# Patient Record
Sex: Female | Born: 1935 | Race: Black or African American | Hispanic: No | Smoking: Former smoker
Health system: Southern US, Community
[De-identification: ages and names within clinical notes are randomized; demographics above are authoritative.]

## PROBLEM LIST (undated history)

## (undated) DIAGNOSIS — Z5189 Encounter for other specified aftercare: Secondary | ICD-10-CM

## (undated) DIAGNOSIS — E785 Hyperlipidemia, unspecified: Secondary | ICD-10-CM

## (undated) DIAGNOSIS — I2510 Atherosclerotic heart disease of native coronary artery without angina pectoris: Secondary | ICD-10-CM

## (undated) DIAGNOSIS — R351 Nocturia: Secondary | ICD-10-CM

## (undated) DIAGNOSIS — K219 Gastro-esophageal reflux disease without esophagitis: Secondary | ICD-10-CM

## (undated) DIAGNOSIS — E119 Type 2 diabetes mellitus without complications: Secondary | ICD-10-CM

## (undated) DIAGNOSIS — M545 Low back pain: Secondary | ICD-10-CM

## (undated) DIAGNOSIS — Z8619 Personal history of other infectious and parasitic diseases: Secondary | ICD-10-CM

## (undated) DIAGNOSIS — Z8489 Family history of other specified conditions: Secondary | ICD-10-CM

## (undated) DIAGNOSIS — R350 Frequency of micturition: Secondary | ICD-10-CM

## (undated) DIAGNOSIS — I10 Essential (primary) hypertension: Secondary | ICD-10-CM

## (undated) DIAGNOSIS — R011 Cardiac murmur, unspecified: Secondary | ICD-10-CM

## (undated) DIAGNOSIS — IMO0001 Reserved for inherently not codable concepts without codable children: Secondary | ICD-10-CM

## (undated) DIAGNOSIS — M1990 Unspecified osteoarthritis, unspecified site: Secondary | ICD-10-CM

## (undated) DIAGNOSIS — F419 Anxiety disorder, unspecified: Secondary | ICD-10-CM

## (undated) DIAGNOSIS — R0602 Shortness of breath: Secondary | ICD-10-CM

## (undated) DIAGNOSIS — G4730 Sleep apnea, unspecified: Secondary | ICD-10-CM

## (undated) DIAGNOSIS — M5430 Sciatica, unspecified side: Secondary | ICD-10-CM

## (undated) DIAGNOSIS — F32A Depression, unspecified: Secondary | ICD-10-CM

## (undated) DIAGNOSIS — G629 Polyneuropathy, unspecified: Secondary | ICD-10-CM

## (undated) DIAGNOSIS — G8929 Other chronic pain: Secondary | ICD-10-CM

## (undated) DIAGNOSIS — N184 Chronic kidney disease, stage 4 (severe): Secondary | ICD-10-CM

## (undated) DIAGNOSIS — R600 Localized edema: Secondary | ICD-10-CM

## (undated) DIAGNOSIS — F329 Major depressive disorder, single episode, unspecified: Secondary | ICD-10-CM

## (undated) HISTORY — PX: DILATION AND CURETTAGE OF UTERUS: SHX78

## (undated) HISTORY — PX: LYMPH NODE DISSECTION: SHX5087

## (undated) HISTORY — PX: TUBAL LIGATION: SHX77

## (undated) HISTORY — PX: COLONOSCOPY: SHX174

## (undated) HISTORY — PX: REFRACTIVE SURGERY: SHX103

## (undated) HISTORY — PX: ESOPHAGOGASTRODUODENOSCOPY: SHX1529

## (undated) HISTORY — PX: EYE SURGERY: SHX253

---

## 1998-09-03 ENCOUNTER — Other Ambulatory Visit: Admission: RE | Admit: 1998-09-03 | Discharge: 1998-09-03 | Payer: Self-pay | Admitting: Obstetrics & Gynecology

## 1999-09-19 ENCOUNTER — Other Ambulatory Visit: Admission: RE | Admit: 1999-09-19 | Discharge: 1999-09-19 | Payer: Self-pay | Admitting: Obstetrics & Gynecology

## 2001-01-31 ENCOUNTER — Other Ambulatory Visit: Admission: RE | Admit: 2001-01-31 | Discharge: 2001-01-31 | Payer: Self-pay | Admitting: Endocrinology

## 2003-08-12 ENCOUNTER — Emergency Department (HOSPITAL_COMMUNITY): Admission: EM | Admit: 2003-08-12 | Discharge: 2003-08-12 | Payer: Self-pay | Admitting: Emergency Medicine

## 2003-08-27 ENCOUNTER — Other Ambulatory Visit: Admission: RE | Admit: 2003-08-27 | Discharge: 2003-08-27 | Payer: Self-pay | Admitting: Endocrinology

## 2004-10-27 ENCOUNTER — Encounter (INDEPENDENT_AMBULATORY_CARE_PROVIDER_SITE_OTHER): Payer: Self-pay | Admitting: *Deleted

## 2004-10-27 ENCOUNTER — Ambulatory Visit (HOSPITAL_COMMUNITY): Admission: RE | Admit: 2004-10-27 | Discharge: 2004-10-27 | Payer: Self-pay | Admitting: *Deleted

## 2004-12-11 ENCOUNTER — Emergency Department (HOSPITAL_COMMUNITY): Admission: EM | Admit: 2004-12-11 | Discharge: 2004-12-11 | Payer: Self-pay | Admitting: *Deleted

## 2006-01-18 ENCOUNTER — Encounter (INDEPENDENT_AMBULATORY_CARE_PROVIDER_SITE_OTHER): Payer: Self-pay | Admitting: *Deleted

## 2006-01-18 ENCOUNTER — Ambulatory Visit (HOSPITAL_COMMUNITY): Admission: RE | Admit: 2006-01-18 | Discharge: 2006-01-18 | Payer: Self-pay | Admitting: *Deleted

## 2006-08-09 ENCOUNTER — Encounter: Admission: RE | Admit: 2006-08-09 | Discharge: 2006-08-09 | Payer: Self-pay | Admitting: Endocrinology

## 2008-04-24 ENCOUNTER — Encounter: Admission: RE | Admit: 2008-04-24 | Discharge: 2008-07-23 | Payer: Self-pay | Admitting: Endocrinology

## 2008-10-16 ENCOUNTER — Other Ambulatory Visit: Admission: RE | Admit: 2008-10-16 | Discharge: 2008-10-16 | Payer: Self-pay | Admitting: Endocrinology

## 2010-04-07 ENCOUNTER — Emergency Department (HOSPITAL_COMMUNITY): Admission: EM | Admit: 2010-04-07 | Discharge: 2010-04-07 | Payer: Self-pay | Admitting: Emergency Medicine

## 2011-05-05 NOTE — Op Note (Signed)
NAMEJAQUISHA, ACEVES                ACCOUNT NO.:  [id number]   MEDICAL RECORD NO.:  [id number]          PATIENT TYPE:  AMB   LOCATION:  ENDO                         FACILITY:  [facility]   PHYSICIAN:  Waverly Ferrari, M.D.    DATE OF BIRTH:  1934-08-22   DATE OF PROCEDURE:  10/27/2004  DATE OF DISCHARGE:                                 OPERATIVE REPORT   PROCEDURE:  Colonoscopy.   INDICATIONS:  Diarrhea and colon cancer screening.   ANESTHESIA:  Demerol 25 mg and Versed 2.5 mg.   DESCRIPTION OF PROCEDURE:  The patient was mildly sedated in the left  lateral decubitus position.  Olympus video coloscope was inserted in the  rectum and passed under direct vision with the cecum identified by the  ileocecal valve and appendiceal orifices, both of which were photographed.  From this point, the colonoscope was slowly withdrawn, taking  circumferential views of the colonic mucosa, stopping to photograph  diverticula along the way and stopping to take random biopsies from normal-  appearing mucosa until we reached the rectum which appeared normal on direct  and retroflexed view.  The endoscope was straightened and withdrawn.  The  patient's vital signs and pulse oximetry remained stable.  The patient  tolerated the procedure well with no apparent complications.   FINDINGS:  Diverticulosis scattered throughout the colon, more in the  sigmoid colon than elsewhere.  Otherwise an unremarkable colonoscopic  examination.  Of note, photographs were miscolored due to the processing,  but otherwise this was an unremarkable examination.   PLAN:  Await biopsy report.  The patient will call me for results and follow  up with me as an outpatient.       GMO/MEDQ  D:  10/27/2004  T:  10/27/2004  Job:  [id number]

## 2011-05-05 NOTE — Op Note (Signed)
NAMECHELCEE, GULBRANDSON                ACCOUNT NO.:  [id number]   MEDICAL RECORD NO.:  [id number]          PATIENT TYPE:  AMB   LOCATION:  ENDO                         FACILITY:  [facility]   PHYSICIAN:  Waverly Ferrari, M.D.    DATE OF BIRTH:  1934/06/05   DATE OF PROCEDURE:  01/18/2006  DATE OF DISCHARGE:                                 OPERATIVE REPORT   PROCEDURE:  Upper endoscopy with biopsy.   INDICATIONS FOR PROCEDURE:  GERD with Barrett's esophagus.   ANESTHESIA:  Demerol 40, Versed 6 mg.   PROCEDURE:  With patient mildly sedated in the left lateral decubitus  position, the Olympus videoscopic endoscope was inserted in the mouth,  passed under direct vision through the esophagus which appeared normal until  we reached the distal esophagus and there appeared to be an area of  Barrett's that had been seen previously.  This was photographed and  biopsied.  We entered the stomach.  Fundus, body, antrum, duodenal bulb and  second portion duodenum all appeared normal.  From this point, the endoscope  was slowly withdrawn taking circumferential views of the duodenal mucosa  until the endoscope had been pulled back into the stomach, placed in  retroflexion to view the stomach from below.  The endoscope was straightened  and withdrawn, taking circumferential views of the remaining gastric and  esophageal mucosa.  Patient's vital signs and pulse oximeter remained  stable.  Patient tolerated the procedure well without apparent  complications.   FINDINGS:  Barrett's esophagus above hiatal hernia.  Await biopsy report.  Patient will call me for results and follow up with me as an outpatient.           ______________________________  Waverly Ferrari, M.D.     GMO/MEDQ  D:  01/18/2006  T:  01/18/2006  Job:  [id number]

## 2011-05-05 NOTE — Op Note (Signed)
NAMEMARIELI, Mary Harding                ACCOUNT NO.:  [id number]   MEDICAL RECORD NO.:  [id number]          PATIENT TYPE:  AMB   LOCATION:  ENDO                         FACILITY:  [facility]   PHYSICIAN:  Waverly Ferrari, M.D.    DATE OF BIRTH:  12/31/1933   DATE OF PROCEDURE:  DATE OF DISCHARGE:                                 OPERATIVE REPORT   PROCEDURE:  Upper endoscopy with biopsy.   SURGEON:   INDICATIONS FOR PROCEDURE:  GERD.   ANESTHESIA:  Demerol 50 and Versed 5 mg.   DESCRIPTION OF PROCEDURE:  With the patient mildly sedated in the left  lateral decubitus position, the Olympus video scope endoscope inserted in  the mouth and passed under direct vision through the esophagus which  appeared normal except there were changes of possible Barrett's photographed  and biopsied.  We entered in the stomach.  Fundus, body, antrum, duodenal  bulb and second portion of the duodenum all appeared normal.  From this  point, the endoscope was slowly withdrawn, taking circumferential views of  the duodenal mucosa until the endoscope was pulled back into the stomach,  placed in retroflexion to view the stomach from below.  The endoscope was  straightened and withdrawn, taking circumferential views of the gastric and  esophageal mucosa.  The patient's vital signs and pulse oximeter remained  stable.  The patient tolerated the procedure well without apparent  complications.   FINDINGS:  Question of Barrett's esophagus.  Biopsies taken.  Unfortunately  the photographs were discolored due to process problem.  Await biopsy  report.  The patient will call for results and follow-up __________.       Belva Crome  D:  10/27/2004  T:  10/27/2004  Job:  [id number]

## 2011-12-22 DIAGNOSIS — E119 Type 2 diabetes mellitus without complications: Secondary | ICD-10-CM | POA: Diagnosis not present

## 2011-12-29 DIAGNOSIS — E119 Type 2 diabetes mellitus without complications: Secondary | ICD-10-CM | POA: Diagnosis not present

## 2011-12-29 DIAGNOSIS — M79609 Pain in unspecified limb: Secondary | ICD-10-CM | POA: Diagnosis not present

## 2011-12-29 DIAGNOSIS — I10 Essential (primary) hypertension: Secondary | ICD-10-CM | POA: Diagnosis not present

## 2011-12-29 DIAGNOSIS — E789 Disorder of lipoprotein metabolism, unspecified: Secondary | ICD-10-CM | POA: Diagnosis not present

## 2011-12-29 DIAGNOSIS — Z23 Encounter for immunization: Secondary | ICD-10-CM | POA: Diagnosis not present

## 2011-12-29 DIAGNOSIS — M25569 Pain in unspecified knee: Secondary | ICD-10-CM | POA: Diagnosis not present

## 2011-12-29 DIAGNOSIS — M25559 Pain in unspecified hip: Secondary | ICD-10-CM | POA: Diagnosis not present

## 2012-01-02 DIAGNOSIS — H6500 Acute serous otitis media, unspecified ear: Secondary | ICD-10-CM | POA: Diagnosis not present

## 2012-01-12 DIAGNOSIS — M1990 Unspecified osteoarthritis, unspecified site: Secondary | ICD-10-CM | POA: Diagnosis not present

## 2012-01-15 ENCOUNTER — Other Ambulatory Visit: Payer: Self-pay | Admitting: Ophthalmology

## 2012-01-15 DIAGNOSIS — H25049 Posterior subcapsular polar age-related cataract, unspecified eye: Secondary | ICD-10-CM | POA: Diagnosis not present

## 2012-01-15 NOTE — H&P (Signed)
  Pre-operative History and Physical for Ophthalmic Surgery  Mary Harding 01/15/2012                  Chief Complaint: Decreased vision OS  Diagnosis: Nuclear Sclerotic Cataract  Allergies not on file No allergies to medication  Prior to Admission medications   Not on File    Planned Procedure:  Phacoemulsification, Posterior Chamber Intra-ocular Lens OS There were no vitals filed for this visit.  Pulse: 80         Temp: NE        Resp: 18       ROS:  non-contributory   No past medical history on file.  No past surgical history on file.   History   Social History  . Marital Status: Widowed    Spouse Name: N/A    Number of Children: N/A  . Years of Education: N/A   Occupational History  . Not on file.   Social History Main Topics  . Smoking status: Not on file  . Smokeless tobacco: Not on file  . Alcohol Use: Not on file  . Drug Use: Not on file  . Sexually Active: Not on file   Other Topics Concern  . Not on file   Social History Narrative  . No narrative on file     The following examination is for anesthesia clearance for minimally invasive Ophthalmic surgery. It is primarily to document heart and lung findings and is not intended to elucidate unknown general medical conditions inclusive of abdominal masses, lung lesions, etc.   General Constitution:  within normal limits    Alertness/Orientation:  Person, time place     yes   HEENT:  Eye Findings: Cataract Left Eye                   left eye  Neck: supple without masses  Chest/Lungs: clear to auscultation  Cardiac: Normal S1 and S2 without Murmur, S3 or S4  Neuro: non fasting  Impression:  Visually significant Cataract OS  Planned Procedure: Phacoemulsification, Posterior Chamber Intraocular Lens OS    Adonis Brook, MD

## 2012-01-16 DIAGNOSIS — E789 Disorder of lipoprotein metabolism, unspecified: Secondary | ICD-10-CM | POA: Diagnosis not present

## 2012-01-16 DIAGNOSIS — E119 Type 2 diabetes mellitus without complications: Secondary | ICD-10-CM | POA: Diagnosis not present

## 2012-01-16 DIAGNOSIS — I10 Essential (primary) hypertension: Secondary | ICD-10-CM | POA: Diagnosis not present

## 2012-01-23 DIAGNOSIS — M79609 Pain in unspecified limb: Secondary | ICD-10-CM | POA: Diagnosis not present

## 2012-01-23 DIAGNOSIS — E119 Type 2 diabetes mellitus without complications: Secondary | ICD-10-CM | POA: Diagnosis not present

## 2012-01-23 DIAGNOSIS — I10 Essential (primary) hypertension: Secondary | ICD-10-CM | POA: Diagnosis not present

## 2012-01-23 DIAGNOSIS — N390 Urinary tract infection, site not specified: Secondary | ICD-10-CM | POA: Diagnosis not present

## 2012-01-30 ENCOUNTER — Encounter (HOSPITAL_COMMUNITY): Payer: Self-pay | Admitting: Pharmacy Technician

## 2012-02-06 ENCOUNTER — Ambulatory Visit (HOSPITAL_COMMUNITY)
Admission: RE | Admit: 2012-02-06 | Discharge: 2012-02-06 | Disposition: A | Discharge status: Home / Self Care | Payer: Medicare Other | Source: Ambulatory Visit | Attending: Anesthesiology | Admitting: Anesthesiology

## 2012-02-06 ENCOUNTER — Other Ambulatory Visit: Payer: Self-pay

## 2012-02-06 ENCOUNTER — Encounter (HOSPITAL_COMMUNITY)
Admission: RE | Admit: 2012-02-06 | Discharge: 2012-02-06 | Disposition: A | Discharge status: Home / Self Care | Payer: Medicare Other | Source: Ambulatory Visit | Attending: Ophthalmology | Admitting: Ophthalmology

## 2012-02-06 ENCOUNTER — Encounter (HOSPITAL_COMMUNITY): Payer: Self-pay

## 2012-02-06 DIAGNOSIS — H269 Unspecified cataract: Secondary | ICD-10-CM | POA: Diagnosis not present

## 2012-02-06 DIAGNOSIS — Z01812 Encounter for preprocedural laboratory examination: Secondary | ICD-10-CM | POA: Insufficient documentation

## 2012-02-06 DIAGNOSIS — Z01818 Encounter for other preprocedural examination: Secondary | ICD-10-CM | POA: Diagnosis not present

## 2012-02-06 DIAGNOSIS — Z01811 Encounter for preprocedural respiratory examination: Secondary | ICD-10-CM | POA: Diagnosis not present

## 2012-02-06 HISTORY — DX: Sciatica, unspecified side: M54.30

## 2012-02-06 HISTORY — DX: Hyperlipidemia, unspecified: E78.5

## 2012-02-06 HISTORY — DX: Unspecified osteoarthritis, unspecified site: M19.90

## 2012-02-06 HISTORY — DX: Sleep apnea, unspecified: G47.30

## 2012-02-06 HISTORY — DX: Essential (primary) hypertension: I10

## 2012-02-06 HISTORY — DX: Reserved for inherently not codable concepts without codable children: IMO0001

## 2012-02-06 HISTORY — DX: Nocturia: R35.1

## 2012-02-06 HISTORY — DX: Polyneuropathy, unspecified: G62.9

## 2012-02-06 HISTORY — DX: Frequency of micturition: R35.0

## 2012-02-06 HISTORY — DX: Localized edema: R60.0

## 2012-02-06 HISTORY — DX: Gastro-esophageal reflux disease without esophagitis: K21.9

## 2012-02-06 HISTORY — DX: Shortness of breath: R06.02

## 2012-02-06 HISTORY — DX: Encounter for other specified aftercare: Z51.89

## 2012-02-06 HISTORY — DX: Personal history of other infectious and parasitic diseases: Z86.19

## 2012-02-06 LAB — CBC
Hemoglobin: 12.8 g/dL (ref 12.0–15.0)
MCH: 29.8 pg (ref 26.0–34.0)
MCHC: 31.6 g/dL (ref 30.0–36.0)
Platelets: 282 10*3/uL (ref 150–400)

## 2012-02-06 LAB — BASIC METABOLIC PANEL
BUN: 20 mg/dL (ref 6–23)
Calcium: 10.8 mg/dL — ABNORMAL HIGH (ref 8.4–10.5)
GFR calc non Af Amer: 54 mL/min — ABNORMAL LOW (ref >90)
Glucose, Bld: 268 mg/dL — ABNORMAL HIGH (ref 70–99)

## 2012-02-06 NOTE — Progress Notes (Signed)
Confirmed with office that the left cataract will be removed by Dr.Geiger on 02/14/12

## 2012-02-06 NOTE — Progress Notes (Signed)
Sleep study requested from Dr.Farr

## 2012-02-06 NOTE — Progress Notes (Signed)
Pt doesn't have a cardiologist;medical MD Dr.Kohut manages HTN/Hyperlipidemia  Stress test done >51yrs ago  Never had a heart cath  Never had an echo

## 2012-02-06 NOTE — Progress Notes (Deleted)
Left eye

## 2012-02-06 NOTE — Pre-Procedure Instructions (Signed)
Mary Harding  02/06/2012   Your procedure is scheduled on:  Wed, Feb 27 @ 0830  Report to [facility] at [place] [place].  Call this number if you have problems the morning of surgery: [phone]   Remember:   Do not eat food:After Midnight.  May have clear liquids: up to 4 Hours before arrival.(until 2:30 am)  Clear liquids include soda, tea, black coffee, apple or grape juice, broth.  Take these medicines the morning of surgery with A SIP OF WATER: Amlodipine,Celebrex,Pain Pill(if needed),and Protonix   Do not wear jewelry, make-up or nail polish.  Do not wear lotions, powders, or perfumes. You may wear deodorant.  Do not shave 48 hours prior to surgery.  Do not bring valuables to the hospital.  Contacts, dentures or bridgework may not be worn into surgery.  Leave suitcase in the car. After surgery it may be brought to your room.  For patients admitted to the hospital, checkout time is 11:00 AM the day of discharge.   Patients discharged the day of surgery will not be allowed to drive home.  Name and phone number of your driver:   Special Instructions: CHG Shower Use Special Wash: 1/2 bottle night before surgery and 1/2 bottle morning of surgery.   Please read over the following fact sheets that you were given: Pain Booklet, Coughing and Deep Breathing and Surgical Site Infection Prevention

## 2012-02-06 NOTE — Progress Notes (Signed)
Average fasting blood sugar 160

## 2012-02-13 MED ORDER — HOMATROPINE HBR 2 % OP SOLN
1.0000 [drp] | OPHTHALMIC | Status: DC | PRN
  Filled 2012-02-13: qty 5

## 2012-02-13 MED ORDER — PHENYLEPHRINE HCL 2.5 % OP SOLN
1.0000 [drp] | OPHTHALMIC | Status: AC | PRN
  Administered 2012-02-14 (×3): 1 [drp] via OPHTHALMIC
  Filled 2012-02-13: qty 3

## 2012-02-13 MED ORDER — GATIFLOXACIN 0.5 % OP SOLN
1.0000 [drp] | OPHTHALMIC | Status: AC | PRN
  Administered 2012-02-14 (×3): 1 [drp] via OPHTHALMIC
  Filled 2012-02-13: qty 2.5

## 2012-02-13 MED ORDER — TETRACAINE HCL 0.5 % OP SOLN
2.0000 [drp] | OPHTHALMIC | Status: AC
  Administered 2012-02-14: 2 [drp] via OPHTHALMIC
  Filled 2012-02-13: qty 2

## 2012-02-13 MED ORDER — PREDNISOLONE ACETATE 1 % OP SUSP
1.0000 [drp] | OPHTHALMIC | Status: AC
  Administered 2012-02-14: 1 [drp] via OPHTHALMIC
  Filled 2012-02-13: qty 5

## 2012-02-14 ENCOUNTER — Encounter (HOSPITAL_COMMUNITY): Payer: Self-pay | Admitting: *Deleted

## 2012-02-14 ENCOUNTER — Encounter (HOSPITAL_COMMUNITY): Payer: Self-pay | Admitting: Anesthesiology

## 2012-02-14 ENCOUNTER — Ambulatory Visit (HOSPITAL_COMMUNITY): Payer: Medicare Other | Admitting: Anesthesiology

## 2012-02-14 ENCOUNTER — Ambulatory Visit (HOSPITAL_COMMUNITY)
Admission: RE | Admit: 2012-02-14 | Discharge: 2012-02-14 | Disposition: A | Discharge status: Home / Self Care | Payer: Medicare Other | Source: Ambulatory Visit | Attending: Ophthalmology | Admitting: Ophthalmology

## 2012-02-14 ENCOUNTER — Encounter (HOSPITAL_COMMUNITY)
Admission: RE | Disposition: A | Discharge status: Home / Self Care | Payer: Self-pay | Source: Ambulatory Visit | Attending: Ophthalmology

## 2012-02-14 DIAGNOSIS — G4710 Hypersomnia, unspecified: Secondary | ICD-10-CM | POA: Diagnosis not present

## 2012-02-14 DIAGNOSIS — IMO0002 Reserved for concepts with insufficient information to code with codable children: Secondary | ICD-10-CM | POA: Diagnosis not present

## 2012-02-14 DIAGNOSIS — H2510 Age-related nuclear cataract, unspecified eye: Secondary | ICD-10-CM | POA: Insufficient documentation

## 2012-02-14 DIAGNOSIS — I10 Essential (primary) hypertension: Secondary | ICD-10-CM | POA: Diagnosis not present

## 2012-02-14 DIAGNOSIS — G4730 Sleep apnea, unspecified: Secondary | ICD-10-CM | POA: Diagnosis not present

## 2012-02-14 DIAGNOSIS — Y9210 Unspecified residential institution as the place of occurrence of the external cause: Secondary | ICD-10-CM | POA: Insufficient documentation

## 2012-02-14 DIAGNOSIS — H269 Unspecified cataract: Secondary | ICD-10-CM | POA: Diagnosis not present

## 2012-02-14 DIAGNOSIS — R0602 Shortness of breath: Secondary | ICD-10-CM | POA: Diagnosis not present

## 2012-02-14 HISTORY — PX: CATARACT EXTRACTION W/PHACO: SHX586

## 2012-02-14 HISTORY — PX: PARS PLANA VITRECTOMY: SHX2166

## 2012-02-14 LAB — GLUCOSE, CAPILLARY
Glucose-Capillary: 107 mg/dL — ABNORMAL HIGH (ref 70–99)
Glucose-Capillary: 136 mg/dL — ABNORMAL HIGH (ref 70–99)

## 2012-02-14 SURGERY — PHACOEMULSIFICATION, CATARACT, WITH IOL INSERTION
Anesthesia: Monitor Anesthesia Care | Site: Eye | Laterality: Left | Wound class: Clean

## 2012-02-14 MED ORDER — NA CHONDROIT SULF-NA HYALURON 40-30 MG/ML IO SOLN
INTRAOCULAR | Status: DC | PRN
  Administered 2012-02-14: 0.5 mL via INTRAOCULAR

## 2012-02-14 MED ORDER — DEXAMETHASONE SODIUM PHOSPHATE 10 MG/ML IJ SOLN: INTRAMUSCULAR | Status: DC | PRN

## 2012-02-14 MED ORDER — MIDAZOLAM HCL 5 MG/5ML IJ SOLN
INTRAMUSCULAR | Status: DC | PRN
  Administered 2012-02-14: 1 mg via INTRAVENOUS

## 2012-02-14 MED ORDER — CEFAZOLIN SUBCONJUNCTIVAL INJECTION 100 MG/0.5 ML
INJECTION | SUBCONJUNCTIVAL | Status: DC | PRN
  Administered 2012-02-14: 1 mL via SUBCONJUNCTIVAL

## 2012-02-14 MED ORDER — SODIUM CHLORIDE 0.9 % IV SOLN
INTRAVENOUS | Status: DC | PRN
  Administered 2012-02-14: 10:00:00 via INTRAVENOUS

## 2012-02-14 MED ORDER — BACITRACIN-POLYMYXIN B 500-10000 UNIT/GM OP OINT
TOPICAL_OINTMENT | OPHTHALMIC | Status: DC | PRN
  Administered 2012-02-14: 1 via OPHTHALMIC

## 2012-02-14 MED ORDER — PROPOFOL 10 MG/ML IV EMUL
INTRAVENOUS | Status: DC | PRN
  Administered 2012-02-14 (×2): 10 mg via INTRAVENOUS
  Administered 2012-02-14: 30 mg via INTRAVENOUS
  Administered 2012-02-14 (×3): 10 mg via INTRAVENOUS

## 2012-02-14 MED ORDER — HYPROMELLOSE (GONIOSCOPIC) 2.5 % OP SOLN
OPHTHALMIC | Status: DC | PRN
  Administered 2012-02-14: 1 [drp] via OPHTHALMIC

## 2012-02-14 MED ORDER — BSS IO SOLN
INTRAOCULAR | Status: DC | PRN
  Administered 2012-02-14: 15 mL via INTRAOCULAR

## 2012-02-14 MED ORDER — LIDOCAINE HCL (PF) 2 % IJ SOLN
INTRAMUSCULAR | Status: DC | PRN
  Administered 2012-02-14: 2.5 mL

## 2012-02-14 MED ORDER — WATER FOR IRRIGATION, STERILE IR SOLN
Status: DC | PRN
  Administered 2012-02-14: 1000 mL

## 2012-02-14 MED ORDER — PROVISC 10 MG/ML IO SOLN
INTRAOCULAR | Status: DC | PRN
  Administered 2012-02-14: .85 mL via INTRAOCULAR

## 2012-02-14 MED ORDER — TRIAMCINOLONE ACETONIDE INTRAVITREAL INJECTION 4 MG/0.1 ML
INTRAOCULAR | Status: DC | PRN
  Administered 2012-02-14: 40 mg via INTRAOCULAR

## 2012-02-14 MED ORDER — BSS IO SOLN
INTRAOCULAR | Status: DC | PRN
  Administered 2012-02-14: 500 mL via INTRAOCULAR

## 2012-02-14 MED ORDER — TETRACAINE HCL 0.5 % OP SOLN
OPHTHALMIC | Status: DC | PRN
  Administered 2012-02-14: 1 [drp] via OPHTHALMIC

## 2012-02-14 MED ORDER — FENTANYL CITRATE 0.05 MG/ML IJ SOLN
INTRAMUSCULAR | Status: DC | PRN
  Administered 2012-02-14: 50 ug via INTRAVENOUS

## 2012-02-14 MED ORDER — DEXTROSE-NACL 5-0.45 % IV SOLN
INTRAVENOUS | Status: DC | PRN
  Administered 2012-02-14: 09:00:00 via INTRAVENOUS

## 2012-02-14 MED ORDER — 0.9 % SODIUM CHLORIDE (POUR BTL) OPTIME
TOPICAL | Status: DC | PRN
  Administered 2012-02-14: 1000 mL

## 2012-02-14 MED ORDER — CEFAZOLIN SUBCONJUNCTIVAL INJECTION 100 MG/0.5 ML
200.0000 mg | INJECTION | SUBCONJUNCTIVAL | Status: DC
  Filled 2012-02-14: qty 1

## 2012-02-14 MED ORDER — EPINEPHRINE HCL 1 MG/ML IJ SOLN
INTRAMUSCULAR | Status: DC | PRN
  Administered 2012-02-14: .3 mg

## 2012-02-14 MED ORDER — LACTATED RINGERS IV SOLN
INTRAVENOUS | Status: DC | PRN
  Administered 2012-02-14: 09:00:00 via INTRAVENOUS

## 2012-02-14 MED ORDER — BUPIVACAINE HCL 0.75 % IJ SOLN
INTRAMUSCULAR | Status: DC | PRN
  Administered 2012-02-14: 2.5 mL

## 2012-02-14 MED ORDER — BSS PLUS IO SOLN
INTRAOCULAR | Status: DC | PRN
  Administered 2012-02-14: 1 via INTRAOCULAR

## 2012-02-14 SURGICAL SUPPLY — 58 items
APPLICATOR COTTON TIP 6IN STRL (MISCELLANEOUS) ×2 IMPLANT
APPLICATOR DR MATTHEWS STRL (MISCELLANEOUS) ×2 IMPLANT
BLADE EYE MINI 60D BEAVER (BLADE) IMPLANT
BLADE KERATOME 2.75 (BLADE) ×2 IMPLANT
BLADE STAB KNIFE 15DEG (BLADE) IMPLANT
CANNULA ANTERIOR CHAMBER 27GA (MISCELLANEOUS) IMPLANT
CLOTH BEACON ORANGE TIMEOUT ST (SAFETY) ×2 IMPLANT
DRAPE OPHTHALMIC 77X100 STRL (CUSTOM PROCEDURE TRAY) ×2 IMPLANT
DRAPE POUCH INSTRU U-SHP 10X18 (DRAPES) ×2 IMPLANT
DRSG TEGADERM 4X4.75 (GAUZE/BANDAGES/DRESSINGS) IMPLANT
FILTER BLUE MILLIPORE (MISCELLANEOUS) IMPLANT
GLOVE SS BIOGEL STRL SZ 6.5 (GLOVE) ×1 IMPLANT
GLOVE SS N UNI LF 6.5 STRL (GLOVE) ×4 IMPLANT
GLOVE SUPERSENSE BIOGEL SZ 6.5 (GLOVE) ×1
GOWN PREVENTION PLUS XLARGE (GOWN DISPOSABLE) ×2 IMPLANT
GOWN STRL NON-REIN LRG LVL3 (GOWN DISPOSABLE) ×2 IMPLANT
ILLUMINATOR WIDEFIELD DIFF (MISCELLANEOUS) ×2 IMPLANT
KIT ROOM TURNOVER OR (KITS) ×2 IMPLANT
KNIFE GRIESHABER SHARP 2.5MM (MISCELLANEOUS) ×2 IMPLANT
LENS IOL ACRYSOF MP POST 22.0 (Intraocular Lens) ×2 IMPLANT
MARKER SKIN DUAL TIP RULER LAB (MISCELLANEOUS) ×2 IMPLANT
MASK EYE SHIELD (GAUZE/BANDAGES/DRESSINGS) ×2 IMPLANT
NEEDLE 18GX1X1/2 (RX/OR ONLY) (NEEDLE) IMPLANT
NEEDLE 22X1 1/2 (OR ONLY) (NEEDLE) ×2 IMPLANT
NEEDLE 25GX 5/8IN NON SAFETY (NEEDLE) ×2 IMPLANT
NEEDLE FILTER BLUNT 18X 1/2SAF (NEEDLE)
NEEDLE FILTER BLUNT 18X1 1/2 (NEEDLE) IMPLANT
NEEDLE HYPO 30X.5 LL (NEEDLE) ×4 IMPLANT
NS IRRIG 1000ML POUR BTL (IV SOLUTION) ×2 IMPLANT
PACK CATARACT CUSTOM (CUSTOM PROCEDURE TRAY) ×2 IMPLANT
PACK CATARACT MCHSCP (PACKS) ×2 IMPLANT
PACK COMBINED CATERACT/VIT 23G (OPHTHALMIC RELATED) IMPLANT
PAD ARMBOARD 7.5X6 YLW CONV (MISCELLANEOUS) ×4 IMPLANT
PAD EYE OVAL STERILE LF (GAUZE/BANDAGES/DRESSINGS) ×2 IMPLANT
PAK VITRECTOMY PIK  23GA (OPHTHALMIC RELATED) ×2 IMPLANT
PHACO TIP KELMAN 45DEG (TIP) ×2 IMPLANT
PROBE ANTERIOR 20G W/INFUS NDL (MISCELLANEOUS) ×2 IMPLANT
ROLLS DENTAL (MISCELLANEOUS) IMPLANT
SHUTTLE MONARCH TYPE A (NEEDLE) ×2 IMPLANT
SOLUTION ANTI FOG 6CC (MISCELLANEOUS) ×2 IMPLANT
SPEAR EYE SURG WECK-CEL (MISCELLANEOUS) ×6 IMPLANT
SUT ETHILON 10-0 CS-B-6CS-B-6 (SUTURE) ×2
SUT ETHILON 5 0 P 3 18 (SUTURE)
SUT ETHILON 9 0 TG140 8 (SUTURE) IMPLANT
SUT NYLON ETHILON 5-0 P-3 1X18 (SUTURE) IMPLANT
SUT PLAIN 6 0 TG1408 (SUTURE) IMPLANT
SUT POLY NON ABSORB 10-0 8 STR (SUTURE) IMPLANT
SUT VICRYL 6 0 S 29 12 (SUTURE) IMPLANT
SUTURE EHLN 10-0 CS-B-6CS-B-6 (SUTURE) ×1 IMPLANT
SYR 20CC LL (SYRINGE) IMPLANT
SYR 5ML LL (SYRINGE) IMPLANT
SYR TB 1ML LUER SLIP (SYRINGE) ×2 IMPLANT
SYRINGE 10CC LL (SYRINGE) IMPLANT
TAPE SURG TRANSPORE 1 IN (GAUZE/BANDAGES/DRESSINGS) ×1 IMPLANT
TAPE SURGICAL TRANSPORE 1 IN (GAUZE/BANDAGES/DRESSINGS) ×1
TOWEL OR 17X24 6PK STRL BLUE (TOWEL DISPOSABLE) ×4 IMPLANT
WATER STERILE IRR 1000ML POUR (IV SOLUTION) ×2 IMPLANT
WIPE INSTRUMENT VISIWIPE 73X73 (MISCELLANEOUS) ×2 IMPLANT

## 2012-02-14 NOTE — Transfer of Care (Signed)
Immediate Anesthesia Transfer of Care Note  Patient: Mary Harding  Procedure(s) Performed: Procedure(s) (LRB): CATARACT EXTRACTION PHACO AND INTRAOCULAR LENS PLACEMENT ([facility]) (Left) PARS PLANA VITRECTOMY WITH 23 GAUGE (Left)  Patient Location: PACU and Short Stay  Anesthesia Type: MAC  Level of Consciousness: awake  Airway & Oxygen Therapy: Patient Spontanous Breathing  Post-op Assessment: Post -op Vital signs reviewed and stable  Post vital signs: stable  Complications: No apparent anesthesia complications

## 2012-02-14 NOTE — Interval H&P Note (Signed)
History and Physical Interval Note:  02/14/2012 8:28 AM  Mary Harding  has presented today for surgery, with the diagnosis of Nuclear Sclerotic Cataract Left Eye  The various methods of treatment have been discussed with the patient and family. After consideration of risks, benefits and other options for treatment, the patient has consented to  Procedure(s) (LRB): CATARACT EXTRACTION PHACO AND INTRAOCULAR LENS PLACEMENT ([place]) (Left) as a surgical intervention .  The patients' history has been reviewed, patient examined, no change in status, stable for surgery.  I have reviewed the patients' chart and labs.  Questions were answered to the patient's satisfaction.     Charnel Giles,MD

## 2012-02-14 NOTE — Op Note (Signed)
Mary Harding 02/14/2012 Cataract   Procedure: Phacoemulsification, Posterior Chamber Intra-ocular Lens Operative Eye:  left eye  Surgeon: Adonis Brook Estimated Blood Loss: minimal Specimens for Pathology:  None Complications: posterior capsule tear with lens fragments in the vitreous  The patient was prepared and draped in the usual manner for ocular surgery on the left eye. A Cook lid speculum was placed. A peripheral clear corneal incision was made at the surgical limbus centered at the 11:00 meridian. A separate clear corneal stab incision was made with a 15 degree blade at the 2:00 meridian to permit bi-manual technique. Provisc was instilled into the anterior chamber through that incision.  A keratome was used to create a self sealing incision entering the anterior chamber at the 11:00 meridian. A capsulorhexis was performed using a bent 25g needle. The lens was hydrodissected and the nucleus was hydrodilineated using a Nichammin cannula. The Chang chopper was inserted and used to rotate the lens to insure adequate lens mobility. The phacoemulsification handpiece was inserted and a combined phaco-chop technique was employed, fracturing the lens into separate sections with subsequent removal with the phaco handpiece.  The lens was fairly adherent to the posterior capsule. Provisc was used to affect separation of the lens from the capsule which did facilitate removal of additional lens nucleus and cortex. While removing the remaining superior cortex  fragment an interior capsule tear developed and some cortex material fell into the vitreous. I had left a generous anterior capsule support. Provisc was instilled and the PC IOL, +22.00,  was placed into the sulcus in front of the capsule. The I/A piece was used to remove the remaining viscoelastic. A 10-0 nylon suture was placed. A 3 port 23g vitrectomy was performed using an infusion cannula at the 4:30 meridian. The vitreous was removed up to the  vitreous base for 360 degrees. The anterior fragments were removed with wide field viewing and the premacular fragments were removed with vitreous cutter without incident.  The infusion was turned down to 41mmhg and the superior cannulas were removed with concomitant tamponade. The posterior subtenons injection of Kenalog was done with the infusion in place, 40mg . Once completed the cannu;a was removed. Subconjunctival injection of Ancef 100/0.70ml was placed without complication. The lid speculum and drapes were removed and the patient's eye was patched with Polymixin/Bacitracin ophthalmic ointment. An eye shield was placed and the patient was transferred alert and conversant from the operating room to the post-operative recovery area.   Adonis Brook, MD

## 2012-02-14 NOTE — Anesthesia Postprocedure Evaluation (Signed)
  Anesthesia Post-op Note  Patient: Mary Harding  Procedure(s) Performed: Procedure(s) (LRB): CATARACT EXTRACTION PHACO AND INTRAOCULAR LENS PLACEMENT ([facility]) (Left) PARS PLANA VITRECTOMY WITH 23 GAUGE (Left)  Patient Location: PACU and Short Stay  Anesthesia Type: MAC  Level of Consciousness: oriented  Airway and Oxygen Therapy: Patient Spontanous Breathing  Post-op Pain: none  Post-op Assessment: Post-op Vital signs reviewed  Post-op Vital Signs: stable  Complications: No apparent anesthesia complications

## 2012-02-14 NOTE — Anesthesia Preprocedure Evaluation (Addendum)
Anesthesia Evaluation  Patient identified by MRN, date of birth, ID band Patient awake    Reviewed: Allergy & Precautions, H&P , NPO status , Patient's Chart, lab work & pertinent test results  Airway Mallampati: II TM Distance: >3 FB Neck ROM: Full    Dental  (+) Edentulous Upper and Edentulous Lower   Pulmonary shortness of breath and with exertion, sleep apnea and Continuous Positive Airway Pressure Ventilation ,  clear to auscultation  Pulmonary exam normal       Cardiovascular hypertension, Pt. on medications Regular Normal    Neuro/Psych  Neuromuscular disease    GI/Hepatic GERD-  Medicated,  Endo/Other  Diabetes mellitus-, Well Controlled, Type 2, Insulin Dependent  Renal/GU      Musculoskeletal   Abdominal (+)  Abdomen: soft. Bowel sounds: normal.  Peds  Hematology   Anesthesia Other Findings   Reproductive/Obstetrics                         Anesthesia Physical Anesthesia Plan  ASA: III  Anesthesia Plan: MAC   Post-op Pain Management:    Induction: Intravenous  Airway Management Planned: Nasal Cannula  Additional Equipment:   Intra-op Plan:   Post-operative Plan:   Informed Consent: I have reviewed the patients History and Physical, chart, labs and discussed the procedure including the risks, benefits and alternatives for the proposed anesthesia with the patient or authorized representative who has indicated his/her understanding and acceptance.     Plan Discussed with: CRNA and Surgeon  Anesthesia Plan Comments:         Anesthesia Quick Evaluation

## 2012-02-14 NOTE — H&P (View-Only) (Signed)
  Pre-operative History and Physical for Ophthalmic Surgery  Mary Harding 01/15/2012                  Chief Complaint: Decreased vision OS  Diagnosis: Nuclear Sclerotic Cataract  Allergies not on file No allergies to medication  Prior to Admission medications   Not on File    Planned Procedure:  Phacoemulsification, Posterior Chamber Intra-ocular Lens OS There were no vitals filed for this visit.  Pulse: 80         Temp: NE        Resp: 18       ROS:  non-contributory   No past medical history on file.  No past surgical history on file.   History   Social History  . Marital Status: Widowed    Spouse Name: N/A    Number of Children: N/A  . Years of Education: N/A   Occupational History  . Not on file.   Social History Main Topics  . Smoking status: Not on file  . Smokeless tobacco: Not on file  . Alcohol Use: Not on file  . Drug Use: Not on file  . Sexually Active: Not on file   Other Topics Concern  . Not on file   Social History Narrative  . No narrative on file     The following examination is for anesthesia clearance for minimally invasive Ophthalmic surgery. It is primarily to document heart and lung findings and is not intended to elucidate unknown general medical conditions inclusive of abdominal masses, lung lesions, etc.   General Constitution:  within normal limits    Alertness/Orientation:  Person, time place     yes   HEENT:  Eye Findings: Cataract Left Eye                   left eye  Neck: supple without masses  Chest/Lungs: clear to auscultation  Cardiac: Normal S1 and S2 without Murmur, S3 or S4  Neuro: non fasting  Impression:  Visually significant Cataract OS  Planned Procedure: Phacoemulsification, Posterior Chamber Intraocular Lens OS    Adonis Brook, MD

## 2012-02-14 NOTE — Discharge Instructions (Signed)
Mary Harding      02/14/2012  Post-operative instructions for [name] L. Anderson Malta, MD  Caring for your eye:  Do not rub your eye and wash your hands before touching the eye area. This is important to avoid injury and infection.  You may use sterile gauze pads and sterile eye wash to cleanse the lid margins of mucous accumulation.  DO NOT REUSE GAUZE after wiping the eye. Use a new clean one if needed.  Be certain not to touch the top of the medication bottle to the eyelids to avoid contaminating your medicine bottle and causing infection.  After eye surgery the surface of the eye and eyelids may be puffy. You may note a red blotch(s) on the surface of the eye or a bruise on your eyelid. These are usually related to injections or instruments used in surgery and are not cause for alarm. You may also notice blood tinged tears on your eye pad, this is common and not cause for alarm. These findings will subside over the coming week or two.  Activity:  No jarring activities. Walking with assistance early on as needed is advised. Avoid straining and let me know if you have significant constipation. Do not bend over at the waist with the head below your waist to minimize risk of bleeding inside the eye.  Avoid getting water from washing your hair or showering  in your eye. Patch the eye if necessary during bathing to avoid contamination.    You may: watch television, work on you computer, read books, eat out, ride in a car.  Sleeping Position:  You may sleep in your customary position with the eye shield on for 2 weeks.  Wear your eye shield for naps and sleeping at night for the first two weeks.   Wait a few minutes between your eye drops when placing them.   Resume your customary medications on your normal schedule. Adonis Brook MD    After office hours I can be reached by calling: [phone]                                                                     or   [phone]

## 2012-02-14 NOTE — Anesthesia Procedure Notes (Signed)
Procedure Name: MAC Date/Time: 02/14/2012 8:59 AM Performed by: Maeola Harman Pre-anesthesia Checklist: Patient identified, Emergency Drugs available, Suction available, Patient being monitored and Timeout performed Patient Re-evaluated:Patient Re-evaluated prior to inductionOxygen Delivery Method: Nasal cannula Preoxygenation: Pre-oxygenation with 100% oxygen Intubation Type: IV induction Placement Confirmation: positive ETCO2

## 2012-02-15 ENCOUNTER — Encounter (HOSPITAL_COMMUNITY): Payer: Self-pay | Admitting: Ophthalmology

## 2012-03-12 DIAGNOSIS — E789 Disorder of lipoprotein metabolism, unspecified: Secondary | ICD-10-CM | POA: Diagnosis not present

## 2012-03-12 DIAGNOSIS — I10 Essential (primary) hypertension: Secondary | ICD-10-CM | POA: Diagnosis not present

## 2012-03-12 DIAGNOSIS — R5381 Other malaise: Secondary | ICD-10-CM | POA: Diagnosis not present

## 2012-03-12 DIAGNOSIS — E119 Type 2 diabetes mellitus without complications: Secondary | ICD-10-CM | POA: Diagnosis not present

## 2012-03-12 DIAGNOSIS — R5383 Other fatigue: Secondary | ICD-10-CM | POA: Diagnosis not present

## 2012-03-18 DIAGNOSIS — E119 Type 2 diabetes mellitus without complications: Secondary | ICD-10-CM | POA: Diagnosis not present

## 2012-03-18 DIAGNOSIS — E789 Disorder of lipoprotein metabolism, unspecified: Secondary | ICD-10-CM | POA: Diagnosis not present

## 2012-03-18 DIAGNOSIS — I10 Essential (primary) hypertension: Secondary | ICD-10-CM | POA: Diagnosis not present

## 2012-03-18 DIAGNOSIS — N390 Urinary tract infection, site not specified: Secondary | ICD-10-CM | POA: Diagnosis not present

## 2012-03-28 DIAGNOSIS — E119 Type 2 diabetes mellitus without complications: Secondary | ICD-10-CM | POA: Diagnosis not present

## 2012-04-04 DIAGNOSIS — N318 Other neuromuscular dysfunction of bladder: Secondary | ICD-10-CM | POA: Diagnosis not present

## 2012-04-04 DIAGNOSIS — I10 Essential (primary) hypertension: Secondary | ICD-10-CM | POA: Diagnosis not present

## 2012-04-04 DIAGNOSIS — E119 Type 2 diabetes mellitus without complications: Secondary | ICD-10-CM | POA: Diagnosis not present

## 2012-04-26 DIAGNOSIS — H2640 Unspecified secondary cataract: Secondary | ICD-10-CM | POA: Diagnosis not present

## 2012-04-26 DIAGNOSIS — H43399 Other vitreous opacities, unspecified eye: Secondary | ICD-10-CM | POA: Diagnosis not present

## 2012-05-15 ENCOUNTER — Other Ambulatory Visit: Payer: Self-pay | Admitting: Ophthalmology

## 2012-05-15 ENCOUNTER — Encounter: Payer: Self-pay | Admitting: Ophthalmology

## 2012-05-15 ENCOUNTER — Encounter (HOSPITAL_COMMUNITY): Payer: Self-pay | Admitting: Pharmacy Technician

## 2012-05-15 MED ORDER — TROPICAMIDE 1 % OP SOLN
1.0000 [drp] | OPHTHALMIC | Status: AC | PRN
  Administered 2012-05-16 (×2): 1 [drp] via OPHTHALMIC
  Filled 2012-05-15: qty 3

## 2012-05-15 MED ORDER — APRACLONIDINE HCL 1 % OP SOLN: 1.0000 [drp] | Freq: Once | OPHTHALMIC | Status: DC

## 2012-05-15 NOTE — Op Note (Signed)
[facility]                                 Minor Surgery Room Physician's Record                                  [  ] Minor Procedure    [ @10RELATIVEDAYS @  ]Yag Laser           05/15/2012   Brief History/ Findings:  78 yo female has had cataract surgery os.  C/O blurred vision and seeing spots that move around.  This bothers her tremendously.  Has opaque posterior capsule with a piece of free floating cortex adherent to posterior capsule margins. Admitted for yag laser capsulotomy. Pre op Dx; Opaque posterior capsule left eye  Local Anesthetic :  None  Patient: Mary Harding  Procedure(s) Performed: Procedure(s) (LRB): YAG LASER APPLICATION (Left)  Anesthesia type: Regional  Patient location: Short Stay  Post pain: Pain level controlled  Post assessment: Post-op Vital signs reviewed  Last Vitals: There were no vitals filed for this visit.  Post vital signs: stable  Level of consciousness: alert   Complications: No apparent anesthesia complications   Procedure: x   Specimen Removed: (Type & Number) none  Disposition:  [  ] Pathology, Routine                        [ x ] Other     [  ] Disposed of  Condition of Patient Post Procedure: [ x ]  Stable  [  ] Other   Discharge Instructions:  [x  ]  Diet , regular   [  ] Other          Activity: [ x ] No restrictions  [  ] Other          Medication: none          Follow-up: 4:45 pm today          Other:  Time:         No name on file.

## 2012-05-15 NOTE — H&P (Signed)
  78 yo female has had cataract surgery left eye.  C/O blurred vision.   Also sees spots moving in her field of vision which bothers her.  Has opaque posterior capsule with a free floating piece of cortex adherent to posterior capsule.  Admitted for yag laser capsulotomy.

## 2012-05-15 NOTE — Brief Op Note (Signed)
05/16/2012  12:39 PM  PATIENT:  Mary Harding  78 y.o. female  PRE-OPERATIVE DIAGNOSIS:  OPAQUE POSTERIOR CAPSULE  POST-OPERATIVE DIAGNOSIS:  *Same :  Procedure(s) (LRB): YAG LASER APPLICATION (Left)  SURGEON:  Surgeon(s) and Role:    Myrtha Mantis., MD - Primary  PHYSICIAN ASSISTANT:   ASSISTANTS: none   ANESTHESIA:   none  EBL:    BLOOD ADMINISTERED:none  DRAINS: none   LOCAL MEDICATIONS USED:  NONE  SPECIMEN:  No Specimen  DISPOSITION OF SPECIMEN:  N/A  COUNTS:  YES  TOURNIQUET:  * No tourniquets in log *  DICTATION: .Note written in paper chart  PLAN OF CARE: Discharge to home after PACU  PATIENT DISPOSITION:  PACU - hemodynamically stable.   Delay start of Pharmacological VTE agent (>24hrs) due to surgical blood loss or risk of bleeding: not applicable                                        [place]                                 Minor Surgery Room Physician's Record                                  [  ] Minor Procedure    [ @10RELATIVEDAYS @  ]Yag Laser           05/15/2012   Brief History/ Findings:77 yo female has had cataract surgery os.  C/O blurred vision.  Admitted for yag laser capsulotomy left eye  : NA    Anesthesia Post Note  Patient: LADAIJA VACCARELLI  Procedure(s) Performed: Procedure(s) (LRB): YAG LASER APPLICATION (Left)  [employer]         214 Total Bursts             52 Shots/Burst              1 Energy/Shot           4.2  Anesthesia type: none  Patient location: Short Stay  Post pain: Pain level controlled  Post assessment: Post-op Vital signs reviewed  Last Vitals: There were no vitals filed for this visit.  Post vital signs: stable  Level of consciousness: alert   Complications: No apparent anesthesia complications   Procedure: yag laser capsulotomy left eye     Specimen Removed: (Type & Number) none  Disposition:  [  ] Pathology, Routine                        [  ] Other      [  ] Disposed of  Condition of Patient Post Procedure: [x  ]  Stable  [  ] Other     Discharge Instructions:  x[  ]  Diet , regular   [  ] Other          Activity: [ x ] No restrictions  [  ] Other          Medication: none          Follow-up: 5 pm today          Other    NA  Time: 12:30  No name on file.

## 2012-05-16 ENCOUNTER — Encounter (HOSPITAL_COMMUNITY)
Admission: RE | Disposition: A | Discharge status: Home / Self Care | Payer: Self-pay | Source: Ambulatory Visit | Attending: Ophthalmology

## 2012-05-16 ENCOUNTER — Ambulatory Visit (HOSPITAL_COMMUNITY)
Admission: RE | Admit: 2012-05-16 | Discharge: 2012-05-16 | Disposition: A | Discharge status: Home / Self Care | Payer: Medicare Other | Source: Ambulatory Visit | Attending: Ophthalmology | Admitting: Ophthalmology

## 2012-05-16 ENCOUNTER — Other Ambulatory Visit: Payer: Self-pay | Admitting: Ophthalmology

## 2012-05-16 DIAGNOSIS — H26499 Other secondary cataract, unspecified eye: Secondary | ICD-10-CM | POA: Diagnosis not present

## 2012-05-16 DIAGNOSIS — H269 Unspecified cataract: Secondary | ICD-10-CM | POA: Insufficient documentation

## 2012-05-16 SURGERY — TREATMENT, USING YAG LASER
Anesthesia: LOCAL | Laterality: Left

## 2012-05-16 SURGERY — MINOR CAPSULOTOMY
Laterality: Left

## 2012-05-16 MED ORDER — TROPICAMIDE 1 % OP SOLN: 1.0000 [drp] | OPHTHALMIC | Status: DC

## 2012-05-16 MED ORDER — APRACLONIDINE HCL 0.5 % OP SOLN: 1.0000 [drp] | Freq: Once | OPHTHALMIC | Status: DC

## 2012-05-16 MED ORDER — ACETAMINOPHEN 325 MG PO TABS
650.0000 mg | ORAL_TABLET | ORAL | Status: DC | PRN
  Filled 2012-05-16: qty 2

## 2012-05-16 NOTE — H&P (Signed)
  77 yo female has had cataract surgery left eye.  C/O blurred vision.  Also sees something floating in her field of vision which bothers her tremendously.  Admitted for yag laser capsulotomy.  Will attempt to disolve a  Piece of cortical material adherent to capsule with one end free floating in pupillary opening.

## 2012-05-16 NOTE — Discharge Instructions (Signed)
@[name] @                                                                               05/15/2012                                               Ophthalmology Evaluation                                          Consult Requested by: Dr. Marland Kitchen Consultation requested for:  ***  HPI: ***  Pertinent Medical History: *** Active Ambulatory Problems    Diagnosis Date Noted  . No Active Ambulatory Problems   Resolved Ambulatory Problems    Diagnosis Date Noted  . No Resolved Ambulatory Problems   Past Medical History  Diagnosis Date  . Hypertension   . Hyperlipidemia   . Sleep apnea   . Shortness of breath   . Sciatica   . Peripheral neuropathy   . Arthritis   . Chronic back pain   . Edema extremities   . GERD (gastroesophageal reflux disease)   . Urinary frequency   . Nocturia   . Blood transfusion 23yrs ago  . Diabetes mellitus   . Cataracts, bilateral   . History of shingles     Pertinent Ophthalmic History:  {Past Ophthalmic History:3041658}  Current Eye Medications: ***  No prescriptions prior to admission    Ophthalmic ROS: [id number]    Confrontation Visual Fields:   OD: [id number]    OS: [id number]    Pupils:   OD: {Exam, Pupils:3041608}  OS: {Exam, Pupils:3041608}   VA:          Near acuity:  cc OD  20/***  J***    cc  OS 20/ ***  J***                                [place] OD  20/***  J***      OS 20/ ***  J***   Intraocular Pressure:   {Eye OD/OS/OU:10724}    [  ]  Deferred                                             {Eye OD/OS/OU:10724}   [  ]  Deferred                                            OD: *** mmhg    OS: *** mmhg   Dilation:  {Eye OD/OS/OU:10724}       Medication used  [  ] NS 2.5% [  ]Tropicamide  [  ] Cyclogyl [  ] Cyclomydril   Lids/Lashes:  OD:  {Exam, Lid/Lash:3041609}    OS:  {Exam, Lid/Lash:3041609}  Conjunctiva:    OD: {Exam, Conj:3041610}    OS: {Exam, Conj:3041610}   Sclera:    OD: {Exam,  Sclera:3041611}      OS: {Exam, Sclera:3041611}    Cornea:    OD: {Exam, Cornea:3041612}   OS: {Exam, Cornea:3041612}  Anterior Chamber:    OD: {Exam, QK:[id number]           {Numbers; 1-4+ (neg and trace):16140} deep,           {Numbers; 1-4+ (neg and trace):16140} cells,            {Numbers; 1-4+ (neg and trace):16140} flare           {Numbers; 1-4+ (neg and trace):16140} fibrin, ***    OS: {Exam, QK:8017743           {Numbers; 1-4+ (neg and trace):16140} deep           {Numbers; 1-4+ (neg and trace):16140} cells           {Numbers; 1-4+ (neg and trace):16140} flare           {Numbers; 1-4+ (neg and trace):16140} fibrin Iris:    OD: {Exam, Iris:3041614}     OS: {Exam, Iris:3041614}    Lens:    OD: {Numbers; 1-4+ (neg and trace):16140} {Exam,Lens:3041615}        OS: {Numbers; 1-4+ (neg and trace):16140} {Exam,Lens:3041615}        Vitreous:   OD: {Exam, Vitreous:3041616}     OS: {Exam, Vitreous:3041616}   Optic Nerve:   OD: C/D: 0.***  X 0. ***           {Exam, optic CW:4450979           {eyeslit-lampanglegrade:18269}   OS: C/D: 0.***  X 0. ***           {Exam, optic CW:4450979             {eyeslit-lampanglegrade:18269}  Peripheral Retina:    OD: {Exam, Peripheral Retina:3041618}    OS: {Exam, Peripheral Retina:3041618}   Macula: OD: {Exam, Macula:3041620} OS: {Exam, LG:9822168  Retina Vessels: OD: {Exam, Retinal Vessels:3041619} OS: {Exam, Retinal Vessels:3041619}   Impression:   *** Discussion: ***  Recommendations/Plan:  ***  Planned studies/Lab testing:   ***  Procedures:  ***  Garlan Fair E MD             @[name] @                                                                               05/15/2012                                               Ophthalmology Evaluation                                          Consult Requested by: Dr. Marland Kitchen Consultation requested for:  ***  HPI: ***  Pertinent Medical  History: *** Active Ambulatory Problems    Diagnosis Date Noted  . No Active Ambulatory Problems   Resolved Ambulatory Problems    Diagnosis Date Noted  . No Resolved Ambulatory Problems   Past Medical History  Diagnosis Date  . Hypertension   . Hyperlipidemia   . Sleep apnea   . Shortness of breath   . Sciatica   . Peripheral neuropathy   . Arthritis   . Chronic back pain   . Edema extremities   . GERD (gastroesophageal reflux disease)   . Urinary frequency   . Nocturia   . Blood transfusion 71yrs ago  . Diabetes mellitus   . Cataracts, bilateral   . History of shingles     Pertinent Ophthalmic History:  {Past Ophthalmic History:3041658}  Current Eye Medications: ***  No prescriptions prior to admission    Ophthalmic ROS: [id number]    Confrontation Visual Fields:   OD: [id number]    OS: [id number]    Pupils:   OD: {Exam, Pupils:3041608}  OS: {Exam, Pupils:3041608}   VA:          Near acuity:  cc OD  20/***  J***    cc  OS 20/ ***  J***                                [place] OD  20/***  J***      OS 20/ ***  J***   Intraocular Pressure:   {Eye OD/OS/OU:10724}    [  ]  Deferred                                             {Eye OD/OS/OU:10724}   [  ]  Deferred                                            OD: *** mmhg    OS: *** mmhg   Dilation:  {Eye OD/OS/OU:10724}       Medication used  [  ] NS 2.5% [  ]Tropicamide  [  ] Cyclogyl [  ] Cyclomydril   Lids/Lashes:     OD:  {Exam, Lid/Lash:3041609}    OS:  {Exam, Lid/Lash:3041609}  Conjunctiva:    OD: {Exam, Conj:3041610}    OS: {Exam, Conj:3041610}   Sclera:    OD: {Exam, Sclera:3041611}      OS: {Exam, Sclera:3041611}    Cornea:    OD: {Exam, Cornea:3041612}   OS: {Exam, Cornea:3041612}  Anterior Chamber:    OD: {Exam, ZA:3695364           {Numbers; 1-4+ (neg and trace):16140} deep,           {Numbers; 1-4+ (neg and trace):16140} cells,            {Numbers; 1-4+ (neg and  trace):16140} flare           {Numbers; 1-4+ (neg and trace):16140} fibrin, ***    OS: {Exam, ZA:3695364           {Numbers; 1-4+ (neg and trace):16140} deep           {Numbers; 1-4+ (neg and trace):16140} cells           {Numbers; 1-4+ (neg  and trace):16140} flare           {Numbers; 1-4+ (neg and trace):16140} fibrin Iris:    OD: {Exam, Iris:3041614}     OS: {Exam, Iris:3041614}    Lens:    OD: {Numbers; 1-4+ (neg and trace):16140} {Exam,Lens:3041615}        OS: {Numbers; 1-4+ (neg and trace):16140} {Exam,Lens:3041615}        Vitreous:   OD: {Exam, Vitreous:3041616}     OS: {Exam, Vitreous:3041616}   Optic Nerve:   OD: C/D: 0.***  X 0. ***           {Exam, optic CL:5646853           {eyeslit-lampanglegrade:18269}   OS: C/D: 0.***  X 0. ***           {Exam, optic CL:5646853             {eyeslit-lampanglegrade:18269}  Peripheral Retina:    OD: {Exam, Peripheral Retina:3041618}    OS: {Exam, Peripheral Retina:3041618}   Macula: OD: {Exam, Macula:3041620} OS: {Exam, KH:3040214  Retina Vessels: OD: {Exam, Retinal Vessels:3041619} OS: {Exam, Retinal Vessels:3041619}   Impression:   *** Discussion: ***  Recommendations/Plan:  ***  Planned studies/Lab testing:   ***  Procedures:  ***  Garlan Fair E MD

## 2012-05-16 NOTE — Progress Notes (Signed)
Pt. Tolerated the procedure without any difficulty. Dr. Ricki Miller gave verbal discharge instructions to pt. Stated to come to the office at 5 PM today .

## 2012-05-17 ENCOUNTER — Encounter (HOSPITAL_COMMUNITY): Payer: Self-pay | Admitting: *Deleted

## 2012-05-19 NOTE — Op Note (Signed)
Mary Harding, Mary Harding                ACCOUNT NO.:  [id number]  MEDICAL RECORD NO.:  [id number]  LOCATION:  [facility]                         FACILITY:  [facility]  PHYSICIAN:  Garlan Fair., M.D.DATE OF BIRTH:  1934-07-01  DATE OF PROCEDURE:  05/18/2012 DATE OF DISCHARGE:  05/16/2012                              OPERATIVE REPORT   This is a 78 year old lady who underwent a cataract surgery several months ago with good visual results.  However, she is persisting in complaining of seeing a spot that moves in her peripheral vision that is extremely bothersome to her.  She states that when it moves it seems as though an insect is about her.  This frightens her.  She was evaluated and found to have a cloudy posterior capsule.  There was also a smaller tear in the Capsule.  Attached to it was a small piece of cortex which had one end attached to the capsule margin, where the tear was located.  There was a free end flowing in the vitreous cavity.  There was also other cortex located on the capsule medially.  The patient was advised that we were trying to open up the capsule more so as to make her vision clearer.  But we would also to try to release the cortical material in hopes of making the floaters that she was seeing disappear.   She was taken to the laser room and positioned appropriate behind the laser.  A large opening was obtainedin the posterior capsule.  In addition, the piece of cortex that wasfree floating in the vitreous cavity was disintegrated by using multiple applications of the laser.  Other pieces of ortex were also liquified.  A total of 214 millijoule of energy were applied to the posterior capsule. There were 4.2 millijoule of energy per shot.  The patient tolerated the procedure well.  She was discharged to the postanesthesia recovery room in satisfactory condition.  She is instructed to see me in my office today for further evaluation and pressure check_.     Garlan Fair., M.D.     TB/MEDQ  D:  05/18/2012  T:  05/18/2012  Job:  [id number]

## 2012-06-18 DIAGNOSIS — E11329 Type 2 diabetes mellitus with mild nonproliferative diabetic retinopathy without macular edema: Secondary | ICD-10-CM | POA: Diagnosis not present

## 2012-06-18 DIAGNOSIS — H59029 Cataract (lens) fragments in eye following cataract surgery, unspecified eye: Secondary | ICD-10-CM | POA: Diagnosis not present

## 2012-06-18 DIAGNOSIS — H35059 Retinal neovascularization, unspecified, unspecified eye: Secondary | ICD-10-CM | POA: Diagnosis not present

## 2012-06-18 DIAGNOSIS — E1165 Type 2 diabetes mellitus with hyperglycemia: Secondary | ICD-10-CM | POA: Diagnosis not present

## 2012-06-18 DIAGNOSIS — H43399 Other vitreous opacities, unspecified eye: Secondary | ICD-10-CM | POA: Diagnosis not present

## 2012-06-18 DIAGNOSIS — E1139 Type 2 diabetes mellitus with other diabetic ophthalmic complication: Secondary | ICD-10-CM | POA: Diagnosis not present

## 2012-07-01 DIAGNOSIS — E119 Type 2 diabetes mellitus without complications: Secondary | ICD-10-CM | POA: Diagnosis not present

## 2012-07-08 DIAGNOSIS — N390 Urinary tract infection, site not specified: Secondary | ICD-10-CM | POA: Diagnosis not present

## 2012-07-08 DIAGNOSIS — E789 Disorder of lipoprotein metabolism, unspecified: Secondary | ICD-10-CM | POA: Diagnosis not present

## 2012-07-08 DIAGNOSIS — E119 Type 2 diabetes mellitus without complications: Secondary | ICD-10-CM | POA: Diagnosis not present

## 2012-07-08 DIAGNOSIS — I10 Essential (primary) hypertension: Secondary | ICD-10-CM | POA: Diagnosis not present

## 2012-08-27 DIAGNOSIS — H2510 Age-related nuclear cataract, unspecified eye: Secondary | ICD-10-CM | POA: Diagnosis not present

## 2012-10-21 DIAGNOSIS — Z23 Encounter for immunization: Secondary | ICD-10-CM | POA: Diagnosis not present

## 2012-10-21 DIAGNOSIS — E7800 Pure hypercholesterolemia, unspecified: Secondary | ICD-10-CM | POA: Diagnosis not present

## 2012-10-21 DIAGNOSIS — R32 Unspecified urinary incontinence: Secondary | ICD-10-CM | POA: Diagnosis not present

## 2012-10-21 DIAGNOSIS — I10 Essential (primary) hypertension: Secondary | ICD-10-CM | POA: Diagnosis not present

## 2012-10-28 DIAGNOSIS — E119 Type 2 diabetes mellitus without complications: Secondary | ICD-10-CM | POA: Diagnosis not present

## 2012-11-05 DIAGNOSIS — R32 Unspecified urinary incontinence: Secondary | ICD-10-CM | POA: Diagnosis not present

## 2012-11-05 DIAGNOSIS — I10 Essential (primary) hypertension: Secondary | ICD-10-CM | POA: Diagnosis not present

## 2012-11-06 DIAGNOSIS — I10 Essential (primary) hypertension: Secondary | ICD-10-CM | POA: Diagnosis not present

## 2012-11-06 DIAGNOSIS — R350 Frequency of micturition: Secondary | ICD-10-CM | POA: Diagnosis not present

## 2012-11-06 DIAGNOSIS — E119 Type 2 diabetes mellitus without complications: Secondary | ICD-10-CM | POA: Diagnosis not present

## 2012-11-19 DIAGNOSIS — Z1231 Encounter for screening mammogram for malignant neoplasm of breast: Secondary | ICD-10-CM | POA: Diagnosis not present

## 2012-12-06 DIAGNOSIS — R350 Frequency of micturition: Secondary | ICD-10-CM | POA: Diagnosis not present

## 2012-12-06 DIAGNOSIS — G4730 Sleep apnea, unspecified: Secondary | ICD-10-CM | POA: Diagnosis not present

## 2012-12-06 DIAGNOSIS — I10 Essential (primary) hypertension: Secondary | ICD-10-CM | POA: Diagnosis not present

## 2012-12-06 DIAGNOSIS — E119 Type 2 diabetes mellitus without complications: Secondary | ICD-10-CM | POA: Diagnosis not present

## 2012-12-27 DIAGNOSIS — E789 Disorder of lipoprotein metabolism, unspecified: Secondary | ICD-10-CM | POA: Diagnosis not present

## 2012-12-27 DIAGNOSIS — E119 Type 2 diabetes mellitus without complications: Secondary | ICD-10-CM | POA: Diagnosis not present

## 2012-12-27 DIAGNOSIS — I10 Essential (primary) hypertension: Secondary | ICD-10-CM | POA: Diagnosis not present

## 2012-12-27 DIAGNOSIS — M79609 Pain in unspecified limb: Secondary | ICD-10-CM | POA: Diagnosis not present

## 2013-01-20 DIAGNOSIS — E119 Type 2 diabetes mellitus without complications: Secondary | ICD-10-CM | POA: Diagnosis not present

## 2013-01-20 DIAGNOSIS — I10 Essential (primary) hypertension: Secondary | ICD-10-CM | POA: Diagnosis not present

## 2013-01-20 DIAGNOSIS — E7800 Pure hypercholesterolemia, unspecified: Secondary | ICD-10-CM | POA: Diagnosis not present

## 2013-01-24 DIAGNOSIS — E789 Disorder of lipoprotein metabolism, unspecified: Secondary | ICD-10-CM | POA: Diagnosis not present

## 2013-01-24 DIAGNOSIS — I10 Essential (primary) hypertension: Secondary | ICD-10-CM | POA: Diagnosis not present

## 2013-01-24 DIAGNOSIS — E119 Type 2 diabetes mellitus without complications: Secondary | ICD-10-CM | POA: Diagnosis not present

## 2013-01-24 DIAGNOSIS — L919 Hypertrophic disorder of the skin, unspecified: Secondary | ICD-10-CM | POA: Diagnosis not present

## 2013-01-29 DIAGNOSIS — H6120 Impacted cerumen, unspecified ear: Secondary | ICD-10-CM | POA: Diagnosis not present

## 2013-04-07 DIAGNOSIS — Z961 Presence of intraocular lens: Secondary | ICD-10-CM | POA: Diagnosis not present

## 2013-04-07 DIAGNOSIS — H40019 Open angle with borderline findings, low risk, unspecified eye: Secondary | ICD-10-CM | POA: Diagnosis not present

## 2013-04-07 DIAGNOSIS — H2510 Age-related nuclear cataract, unspecified eye: Secondary | ICD-10-CM | POA: Diagnosis not present

## 2013-04-07 DIAGNOSIS — E1139 Type 2 diabetes mellitus with other diabetic ophthalmic complication: Secondary | ICD-10-CM | POA: Diagnosis not present

## 2013-04-07 DIAGNOSIS — E11319 Type 2 diabetes mellitus with unspecified diabetic retinopathy without macular edema: Secondary | ICD-10-CM | POA: Diagnosis not present

## 2013-04-18 DIAGNOSIS — E119 Type 2 diabetes mellitus without complications: Secondary | ICD-10-CM | POA: Diagnosis not present

## 2013-04-23 DIAGNOSIS — Z01818 Encounter for other preprocedural examination: Secondary | ICD-10-CM | POA: Diagnosis not present

## 2013-04-23 DIAGNOSIS — E119 Type 2 diabetes mellitus without complications: Secondary | ICD-10-CM | POA: Diagnosis not present

## 2013-04-23 DIAGNOSIS — E789 Disorder of lipoprotein metabolism, unspecified: Secondary | ICD-10-CM | POA: Diagnosis not present

## 2013-04-23 DIAGNOSIS — I10 Essential (primary) hypertension: Secondary | ICD-10-CM | POA: Diagnosis not present

## 2013-04-29 DIAGNOSIS — H2510 Age-related nuclear cataract, unspecified eye: Secondary | ICD-10-CM | POA: Diagnosis not present

## 2013-04-29 DIAGNOSIS — H269 Unspecified cataract: Secondary | ICD-10-CM | POA: Diagnosis not present

## 2013-07-23 DIAGNOSIS — E119 Type 2 diabetes mellitus without complications: Secondary | ICD-10-CM | POA: Diagnosis not present

## 2013-07-30 DIAGNOSIS — E119 Type 2 diabetes mellitus without complications: Secondary | ICD-10-CM | POA: Diagnosis not present

## 2013-07-30 DIAGNOSIS — R32 Unspecified urinary incontinence: Secondary | ICD-10-CM | POA: Diagnosis not present

## 2013-07-30 DIAGNOSIS — N318 Other neuromuscular dysfunction of bladder: Secondary | ICD-10-CM | POA: Diagnosis not present

## 2013-07-30 DIAGNOSIS — E789 Disorder of lipoprotein metabolism, unspecified: Secondary | ICD-10-CM | POA: Diagnosis not present

## 2013-09-09 DIAGNOSIS — H40029 Open angle with borderline findings, high risk, unspecified eye: Secondary | ICD-10-CM | POA: Diagnosis not present

## 2013-09-09 DIAGNOSIS — H1045 Other chronic allergic conjunctivitis: Secondary | ICD-10-CM | POA: Diagnosis not present

## 2013-10-10 DIAGNOSIS — I10 Essential (primary) hypertension: Secondary | ICD-10-CM | POA: Diagnosis not present

## 2013-10-10 DIAGNOSIS — Z23 Encounter for immunization: Secondary | ICD-10-CM | POA: Diagnosis not present

## 2013-10-10 DIAGNOSIS — E119 Type 2 diabetes mellitus without complications: Secondary | ICD-10-CM | POA: Diagnosis not present

## 2013-10-10 DIAGNOSIS — M1990 Unspecified osteoarthritis, unspecified site: Secondary | ICD-10-CM | POA: Diagnosis not present

## 2013-10-24 DIAGNOSIS — E119 Type 2 diabetes mellitus without complications: Secondary | ICD-10-CM | POA: Diagnosis not present

## 2013-10-31 DIAGNOSIS — E119 Type 2 diabetes mellitus without complications: Secondary | ICD-10-CM | POA: Diagnosis not present

## 2013-10-31 DIAGNOSIS — E789 Disorder of lipoprotein metabolism, unspecified: Secondary | ICD-10-CM | POA: Diagnosis not present

## 2013-10-31 DIAGNOSIS — I10 Essential (primary) hypertension: Secondary | ICD-10-CM | POA: Diagnosis not present

## 2013-11-12 DIAGNOSIS — R079 Chest pain, unspecified: Secondary | ICD-10-CM | POA: Diagnosis not present

## 2013-11-12 DIAGNOSIS — R3915 Urgency of urination: Secondary | ICD-10-CM | POA: Diagnosis not present

## 2013-11-12 DIAGNOSIS — R0609 Other forms of dyspnea: Secondary | ICD-10-CM | POA: Diagnosis not present

## 2013-11-12 DIAGNOSIS — E119 Type 2 diabetes mellitus without complications: Secondary | ICD-10-CM | POA: Diagnosis not present

## 2013-11-17 DIAGNOSIS — E1129 Type 2 diabetes mellitus with other diabetic kidney complication: Secondary | ICD-10-CM | POA: Diagnosis not present

## 2013-11-17 DIAGNOSIS — R0789 Other chest pain: Secondary | ICD-10-CM | POA: Diagnosis not present

## 2013-11-17 DIAGNOSIS — I10 Essential (primary) hypertension: Secondary | ICD-10-CM | POA: Diagnosis not present

## 2013-11-17 DIAGNOSIS — R0602 Shortness of breath: Secondary | ICD-10-CM | POA: Diagnosis not present

## 2013-11-19 DIAGNOSIS — R079 Chest pain, unspecified: Secondary | ICD-10-CM | POA: Diagnosis not present

## 2013-11-19 DIAGNOSIS — E119 Type 2 diabetes mellitus without complications: Secondary | ICD-10-CM | POA: Diagnosis not present

## 2013-11-19 DIAGNOSIS — I10 Essential (primary) hypertension: Secondary | ICD-10-CM | POA: Diagnosis not present

## 2013-11-21 DIAGNOSIS — R0602 Shortness of breath: Secondary | ICD-10-CM | POA: Diagnosis not present

## 2013-11-21 DIAGNOSIS — E119 Type 2 diabetes mellitus without complications: Secondary | ICD-10-CM | POA: Diagnosis not present

## 2013-11-21 DIAGNOSIS — R079 Chest pain, unspecified: Secondary | ICD-10-CM | POA: Diagnosis not present

## 2013-11-24 DIAGNOSIS — E119 Type 2 diabetes mellitus without complications: Secondary | ICD-10-CM | POA: Diagnosis not present

## 2013-11-24 DIAGNOSIS — Z961 Presence of intraocular lens: Secondary | ICD-10-CM | POA: Diagnosis not present

## 2013-11-24 DIAGNOSIS — H1045 Other chronic allergic conjunctivitis: Secondary | ICD-10-CM | POA: Diagnosis not present

## 2013-11-24 DIAGNOSIS — E11319 Type 2 diabetes mellitus with unspecified diabetic retinopathy without macular edema: Secondary | ICD-10-CM | POA: Diagnosis not present

## 2013-11-24 DIAGNOSIS — H179 Unspecified corneal scar and opacity: Secondary | ICD-10-CM | POA: Diagnosis not present

## 2013-11-24 DIAGNOSIS — H40029 Open angle with borderline findings, high risk, unspecified eye: Secondary | ICD-10-CM | POA: Diagnosis not present

## 2013-12-01 DIAGNOSIS — R079 Chest pain, unspecified: Secondary | ICD-10-CM | POA: Diagnosis not present

## 2013-12-01 DIAGNOSIS — R0602 Shortness of breath: Secondary | ICD-10-CM | POA: Diagnosis not present

## 2013-12-01 DIAGNOSIS — I10 Essential (primary) hypertension: Secondary | ICD-10-CM | POA: Diagnosis not present

## 2013-12-19 DIAGNOSIS — E1129 Type 2 diabetes mellitus with other diabetic kidney complication: Secondary | ICD-10-CM | POA: Diagnosis not present

## 2013-12-19 DIAGNOSIS — R0789 Other chest pain: Secondary | ICD-10-CM | POA: Diagnosis not present

## 2013-12-19 DIAGNOSIS — N183 Chronic kidney disease, stage 3 unspecified: Secondary | ICD-10-CM | POA: Diagnosis not present

## 2013-12-19 DIAGNOSIS — E785 Hyperlipidemia, unspecified: Secondary | ICD-10-CM | POA: Diagnosis not present

## 2014-01-14 DIAGNOSIS — Z1231 Encounter for screening mammogram for malignant neoplasm of breast: Secondary | ICD-10-CM | POA: Diagnosis not present

## 2014-01-27 DIAGNOSIS — R3915 Urgency of urination: Secondary | ICD-10-CM | POA: Diagnosis not present

## 2014-01-27 DIAGNOSIS — R05 Cough: Secondary | ICD-10-CM | POA: Diagnosis not present

## 2014-01-27 DIAGNOSIS — R059 Cough, unspecified: Secondary | ICD-10-CM | POA: Diagnosis not present

## 2014-01-27 DIAGNOSIS — E119 Type 2 diabetes mellitus without complications: Secondary | ICD-10-CM | POA: Diagnosis not present

## 2014-01-27 DIAGNOSIS — I10 Essential (primary) hypertension: Secondary | ICD-10-CM | POA: Diagnosis not present

## 2014-02-04 DIAGNOSIS — I10 Essential (primary) hypertension: Secondary | ICD-10-CM | POA: Diagnosis not present

## 2014-02-04 DIAGNOSIS — E119 Type 2 diabetes mellitus without complications: Secondary | ICD-10-CM | POA: Diagnosis not present

## 2014-02-04 DIAGNOSIS — E7800 Pure hypercholesterolemia, unspecified: Secondary | ICD-10-CM | POA: Diagnosis not present

## 2014-02-04 DIAGNOSIS — Z79899 Other long term (current) drug therapy: Secondary | ICD-10-CM | POA: Diagnosis not present

## 2014-02-05 IMAGING — CR DG CHEST 2V
2 series · 2 of 2 positions shown · non-contrast
Comparison: 10/26/2010

CLINICAL DATA: Preop cataract surgery

CHEST - 2 VIEW

[view not recorded (1 of 2)]
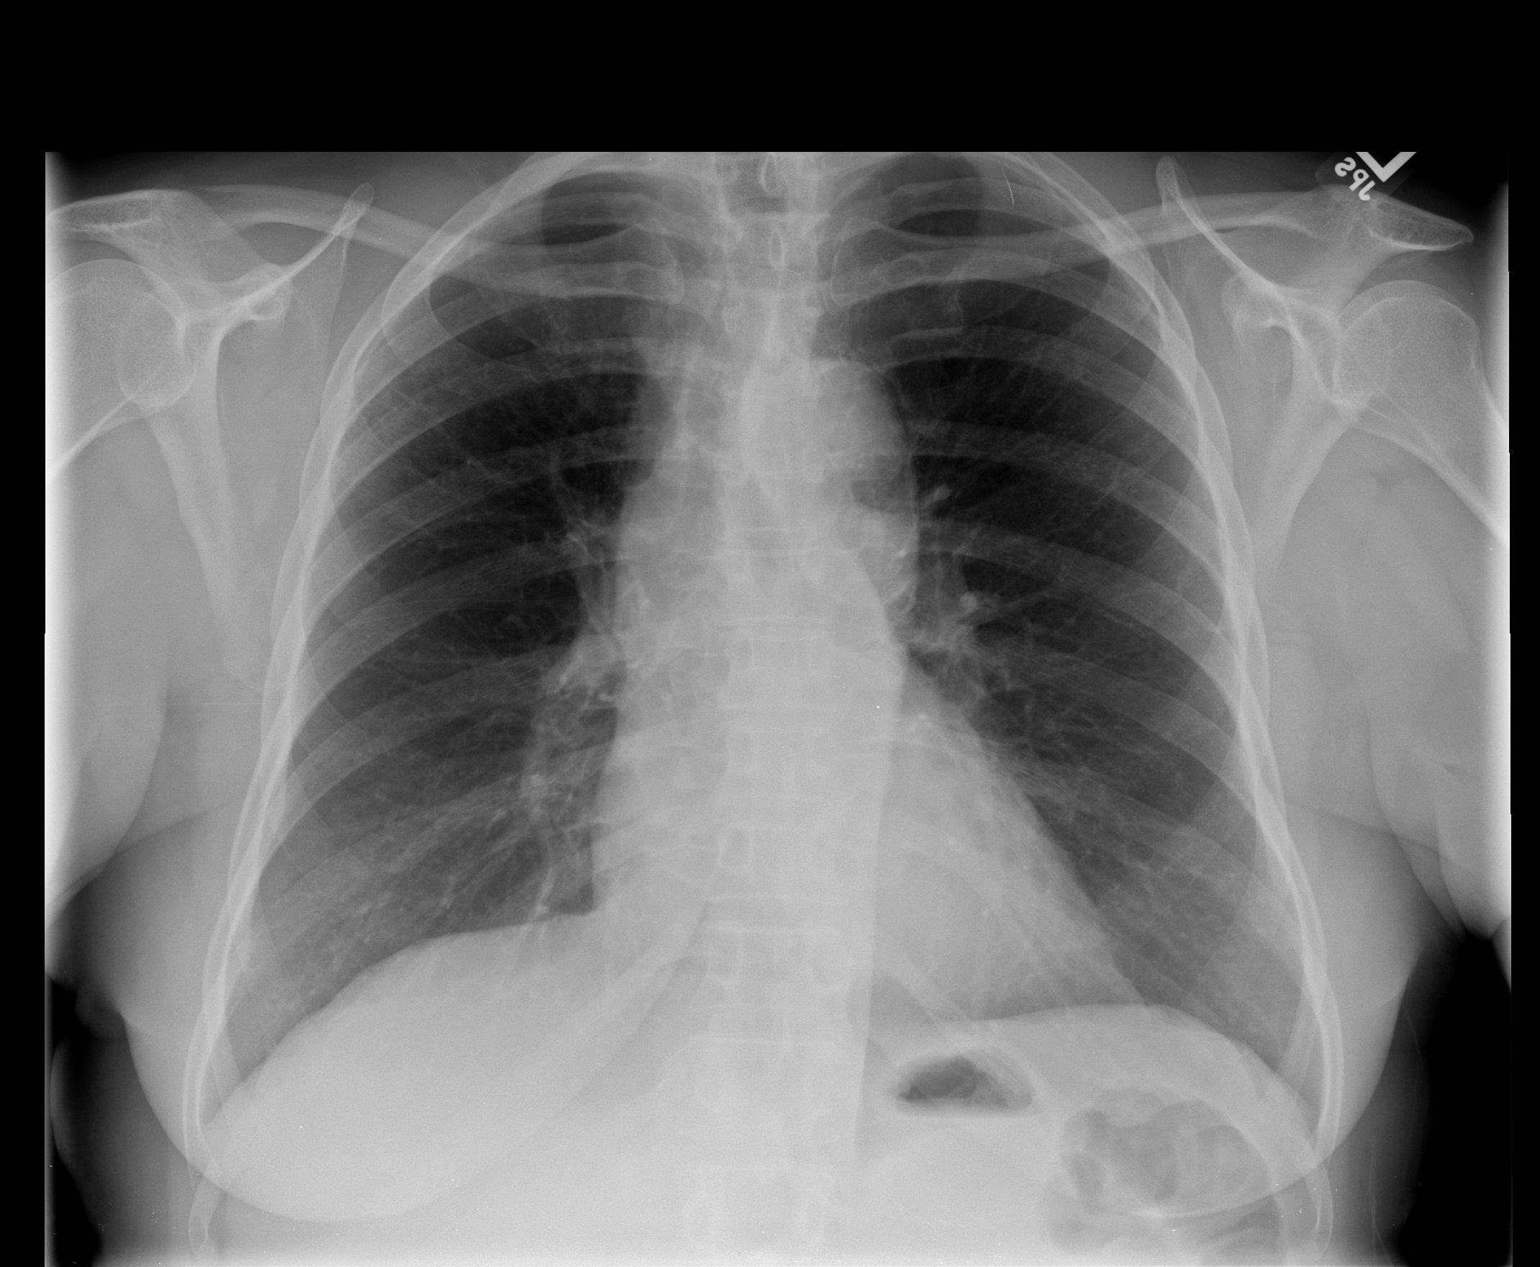

[view not recorded (2 of 2)]
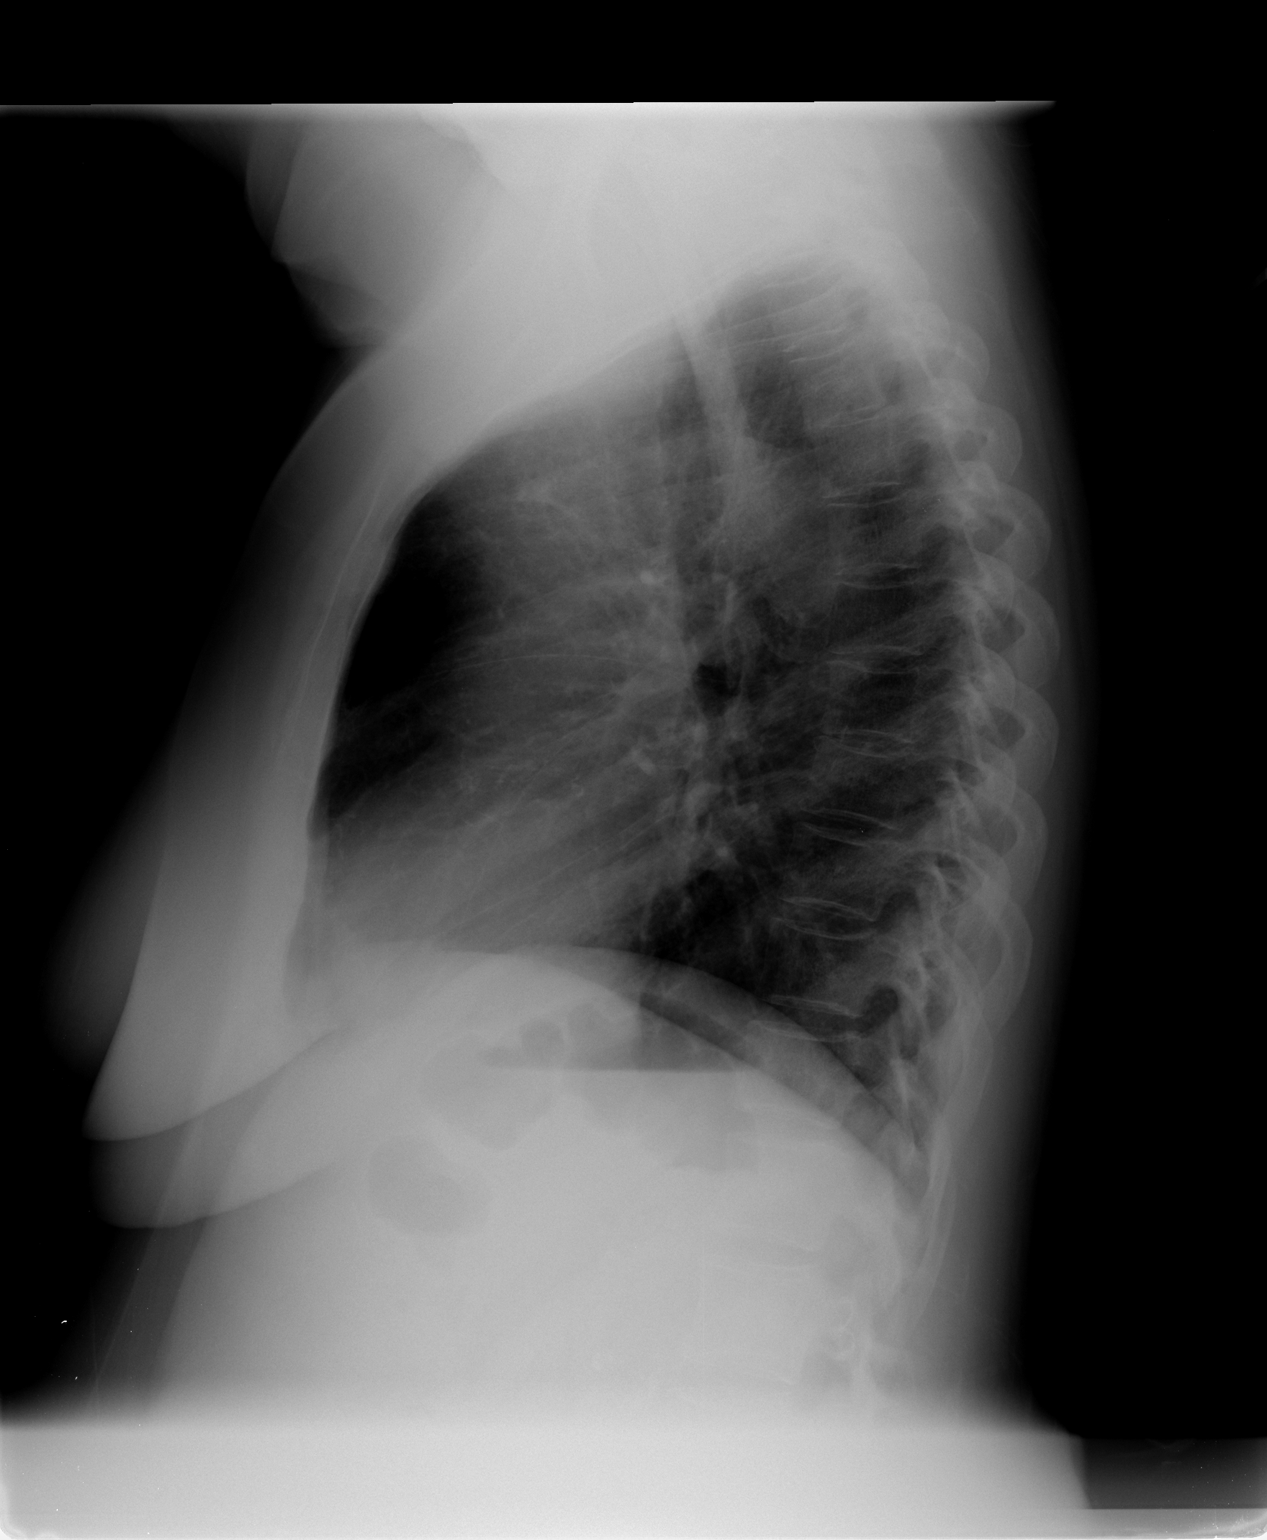

[2 of 2 positions shown; findings below may reference images not displayed]

FINDINGS: Lungs are clear. No pleural effusion or pneumothorax.

Cardiomediastinal silhouette is within normal limits.

Mild degenerative changes of the visualized thoracolumbar spine.
IMPRESSION: No evidence of acute cardiopulmonary disease.

## 2014-02-17 DIAGNOSIS — IMO0002 Reserved for concepts with insufficient information to code with codable children: Secondary | ICD-10-CM | POA: Diagnosis not present

## 2014-02-17 DIAGNOSIS — M1990 Unspecified osteoarthritis, unspecified site: Secondary | ICD-10-CM | POA: Diagnosis not present

## 2014-02-17 DIAGNOSIS — E119 Type 2 diabetes mellitus without complications: Secondary | ICD-10-CM | POA: Diagnosis not present

## 2014-02-17 DIAGNOSIS — E7800 Pure hypercholesterolemia, unspecified: Secondary | ICD-10-CM | POA: Diagnosis not present

## 2014-03-05 DIAGNOSIS — H1045 Other chronic allergic conjunctivitis: Secondary | ICD-10-CM | POA: Diagnosis not present

## 2014-03-05 DIAGNOSIS — Z961 Presence of intraocular lens: Secondary | ICD-10-CM | POA: Diagnosis not present

## 2014-03-05 DIAGNOSIS — H40029 Open angle with borderline findings, high risk, unspecified eye: Secondary | ICD-10-CM | POA: Diagnosis not present

## 2014-03-25 DIAGNOSIS — H40059 Ocular hypertension, unspecified eye: Secondary | ICD-10-CM | POA: Diagnosis not present

## 2014-04-08 ENCOUNTER — Encounter (HOSPITAL_COMMUNITY): Payer: Self-pay | Admitting: Emergency Medicine

## 2014-04-08 ENCOUNTER — Inpatient Hospital Stay (HOSPITAL_COMMUNITY)
Admission: EM | Admit: 2014-04-08 | Discharge: 2014-04-10 | DRG: 287 | Disposition: A | Discharge status: Home / Self Care | Payer: Medicare Other | Attending: Cardiology | Admitting: Cardiology

## 2014-04-08 ENCOUNTER — Emergency Department (HOSPITAL_COMMUNITY): Payer: Medicare Other

## 2014-04-08 DIAGNOSIS — Z8619 Personal history of other infectious and parasitic diseases: Secondary | ICD-10-CM

## 2014-04-08 DIAGNOSIS — E1129 Type 2 diabetes mellitus with other diabetic kidney complication: Secondary | ICD-10-CM | POA: Diagnosis not present

## 2014-04-08 DIAGNOSIS — I129 Hypertensive chronic kidney disease with stage 1 through stage 4 chronic kidney disease, or unspecified chronic kidney disease: Secondary | ICD-10-CM | POA: Diagnosis present

## 2014-04-08 DIAGNOSIS — I200 Unstable angina: Secondary | ICD-10-CM | POA: Diagnosis present

## 2014-04-08 DIAGNOSIS — I2510 Atherosclerotic heart disease of native coronary artery without angina pectoris: Secondary | ICD-10-CM | POA: Diagnosis not present

## 2014-04-08 DIAGNOSIS — R079 Chest pain, unspecified: Secondary | ICD-10-CM | POA: Diagnosis not present

## 2014-04-08 DIAGNOSIS — M479 Spondylosis, unspecified: Secondary | ICD-10-CM | POA: Diagnosis present

## 2014-04-08 DIAGNOSIS — R350 Frequency of micturition: Secondary | ICD-10-CM | POA: Diagnosis present

## 2014-04-08 DIAGNOSIS — Z9849 Cataract extraction status, unspecified eye: Secondary | ICD-10-CM

## 2014-04-08 DIAGNOSIS — R0989 Other specified symptoms and signs involving the circulatory and respiratory systems: Secondary | ICD-10-CM | POA: Diagnosis not present

## 2014-04-08 DIAGNOSIS — IMO0001 Reserved for inherently not codable concepts without codable children: Secondary | ICD-10-CM

## 2014-04-08 DIAGNOSIS — I2584 Coronary atherosclerosis due to calcified coronary lesion: Secondary | ICD-10-CM | POA: Diagnosis present

## 2014-04-08 DIAGNOSIS — N183 Chronic kidney disease, stage 3 unspecified: Secondary | ICD-10-CM | POA: Diagnosis not present

## 2014-04-08 DIAGNOSIS — D649 Anemia, unspecified: Secondary | ICD-10-CM | POA: Diagnosis present

## 2014-04-08 DIAGNOSIS — E1139 Type 2 diabetes mellitus with other diabetic ophthalmic complication: Secondary | ICD-10-CM | POA: Diagnosis present

## 2014-04-08 DIAGNOSIS — R0609 Other forms of dyspnea: Secondary | ICD-10-CM | POA: Diagnosis not present

## 2014-04-08 DIAGNOSIS — K219 Gastro-esophageal reflux disease without esophagitis: Secondary | ICD-10-CM | POA: Diagnosis present

## 2014-04-08 DIAGNOSIS — E785 Hyperlipidemia, unspecified: Secondary | ICD-10-CM | POA: Diagnosis present

## 2014-04-08 DIAGNOSIS — R609 Edema, unspecified: Secondary | ICD-10-CM | POA: Diagnosis present

## 2014-04-08 DIAGNOSIS — M5430 Sciatica, unspecified side: Secondary | ICD-10-CM | POA: Diagnosis present

## 2014-04-08 DIAGNOSIS — G609 Hereditary and idiopathic neuropathy, unspecified: Secondary | ICD-10-CM | POA: Diagnosis present

## 2014-04-08 DIAGNOSIS — G4730 Sleep apnea, unspecified: Secondary | ICD-10-CM | POA: Diagnosis present

## 2014-04-08 DIAGNOSIS — F17200 Nicotine dependence, unspecified, uncomplicated: Secondary | ICD-10-CM | POA: Diagnosis present

## 2014-04-08 DIAGNOSIS — E11319 Type 2 diabetes mellitus with unspecified diabetic retinopathy without macular edema: Secondary | ICD-10-CM | POA: Diagnosis present

## 2014-04-08 DIAGNOSIS — I10 Essential (primary) hypertension: Secondary | ICD-10-CM | POA: Diagnosis not present

## 2014-04-08 DIAGNOSIS — E1165 Type 2 diabetes mellitus with hyperglycemia: Secondary | ICD-10-CM

## 2014-04-08 LAB — COMPREHENSIVE METABOLIC PANEL
ALT: 18 U/L (ref 0–35)
AST: 18 U/L (ref 0–37)
Albumin: 4.2 g/dL (ref 3.5–5.2)
Alkaline Phosphatase: 80 U/L (ref 39–117)
BILIRUBIN TOTAL: 0.3 mg/dL (ref 0.3–1.2)
BUN: 14 mg/dL (ref 6–23)
CALCIUM: 9.9 mg/dL (ref 8.4–10.5)
CHLORIDE: 102 meq/L (ref 96–112)
CO2: 22 meq/L (ref 19–32)
CREATININE: 1.1 mg/dL (ref 0.50–1.10)
GFR calc Af Amer: 55 mL/min — ABNORMAL LOW (ref >90)
GFR, EST NON AFRICAN AMERICAN: 47 mL/min — AB (ref >90)
Glucose, Bld: 267 mg/dL — ABNORMAL HIGH (ref 70–99)
Potassium: 4.7 mEq/L (ref 3.7–5.3)
Sodium: 141 mEq/L (ref 137–147)
Total Protein: 7.3 g/dL (ref 6.0–8.3)

## 2014-04-08 LAB — CBC WITH DIFFERENTIAL/PLATELET
BASOS ABS: 0 10*3/uL (ref 0.0–0.1)
BASOS PCT: 0 % (ref 0–1)
EOS PCT: 1 % (ref 0–5)
Eosinophils Absolute: 0.1 10*3/uL (ref 0.0–0.7)
HEMATOCRIT: 36.5 % (ref 36.0–46.0)
HEMOGLOBIN: 11.7 g/dL — AB (ref 12.0–15.0)
LYMPHS PCT: 36 % (ref 12–46)
Lymphs Abs: 4.1 10*3/uL — ABNORMAL HIGH (ref 0.7–4.0)
MCH: 29.9 pg (ref 26.0–34.0)
MCHC: 32.1 g/dL (ref 30.0–36.0)
MCV: 93.4 fL (ref 78.0–100.0)
MONO ABS: 0.6 10*3/uL (ref 0.1–1.0)
MONOS PCT: 5 % (ref 3–12)
Neutro Abs: 6.5 10*3/uL (ref 1.7–7.7)
Neutrophils Relative %: 58 % (ref 43–77)
Platelets: 345 10*3/uL (ref 150–400)
RBC: 3.91 MIL/uL (ref 3.87–5.11)
RDW: 14.4 % (ref 11.5–15.5)
WBC: 11.3 10*3/uL — ABNORMAL HIGH (ref 4.0–10.5)

## 2014-04-08 LAB — APTT: APTT: 173 s — AB (ref 24–37)

## 2014-04-08 LAB — PROTIME-INR
INR: 0.83 (ref 0.00–1.49)
Prothrombin Time: 11.3 seconds — ABNORMAL LOW (ref 11.6–15.2)

## 2014-04-08 LAB — GLUCOSE, CAPILLARY: Glucose-Capillary: 238 mg/dL — ABNORMAL HIGH (ref 70–99)

## 2014-04-08 LAB — HEMOGLOBIN A1C
HEMOGLOBIN A1C: 9 % — AB (ref <5.7)
MEAN PLASMA GLUCOSE: 212 mg/dL — AB (ref <117)

## 2014-04-08 LAB — PRO B NATRIURETIC PEPTIDE: Pro B Natriuretic peptide (BNP): 107.4 pg/mL (ref 0–450)

## 2014-04-08 LAB — TROPONIN I: Troponin I: <0.3 ng/mL (ref <0.30)

## 2014-04-08 MED ORDER — SODIUM CHLORIDE 0.9 % IV SOLN: 250.0000 mL | INTRAVENOUS | Status: DC | PRN

## 2014-04-08 MED ORDER — ASPIRIN EC 81 MG PO TBEC: 81.0000 mg | DELAYED_RELEASE_TABLET | Freq: Every day | ORAL | Status: DC

## 2014-04-08 MED ORDER — AMLODIPINE BESYLATE 5 MG PO TABS
5.0000 mg | ORAL_TABLET | Freq: Every day | ORAL | Status: DC
  Filled 2014-04-08 (×2): qty 1

## 2014-04-08 MED ORDER — ASPIRIN 81 MG PO CHEW: 324.0000 mg | CHEWABLE_TABLET | ORAL | Status: AC

## 2014-04-08 MED ORDER — GLIMEPIRIDE 2 MG PO TABS
2.0000 mg | ORAL_TABLET | Freq: Every day | ORAL | Status: DC
  Administered 2014-04-10: 2 mg via ORAL
  Filled 2014-04-08 (×3): qty 1

## 2014-04-08 MED ORDER — MIRABEGRON ER 50 MG PO TB24
50.0000 mg | ORAL_TABLET | Freq: Every day | ORAL | Status: DC
  Administered 2014-04-09: 50 mg via ORAL
  Filled 2014-04-08 (×2): qty 1

## 2014-04-08 MED ORDER — PANTOPRAZOLE SODIUM 40 MG PO TBEC
40.0000 mg | DELAYED_RELEASE_TABLET | Freq: Every day | ORAL | Status: DC
  Administered 2014-04-09: 40 mg via ORAL
  Filled 2014-04-08: qty 1

## 2014-04-08 MED ORDER — LISINOPRIL 10 MG PO TABS
10.0000 mg | ORAL_TABLET | Freq: Every day | ORAL | Status: DC
  Administered 2014-04-09: 10 mg via ORAL
  Filled 2014-04-08 (×2): qty 1

## 2014-04-08 MED ORDER — NITROGLYCERIN IN D5W 200-5 MCG/ML-% IV SOLN
2.0000 ug/min | INTRAVENOUS | Status: DC
  Administered 2014-04-08: 20 ug/min via INTRAVENOUS
  Filled 2014-04-08: qty 250

## 2014-04-08 MED ORDER — ASPIRIN 300 MG RE SUPP: 300.0000 mg | RECTAL | Status: AC

## 2014-04-08 MED ORDER — IRBESARTAN 300 MG PO TABS
300.0000 mg | ORAL_TABLET | Freq: Every day | ORAL | Status: DC
  Administered 2014-04-09: 300 mg via ORAL
  Filled 2014-04-08 (×2): qty 1

## 2014-04-08 MED ORDER — CYCLOBENZAPRINE HCL 5 MG PO TABS
5.0000 mg | ORAL_TABLET | Freq: Every day | ORAL | Status: DC
  Administered 2014-04-08 – 2014-04-09 (×2): 5 mg via ORAL
  Filled 2014-04-08 (×3): qty 1

## 2014-04-08 MED ORDER — SODIUM CHLORIDE 0.9 % IJ SOLN: 3.0000 mL | INTRAMUSCULAR | Status: DC | PRN

## 2014-04-08 MED ORDER — INSULIN GLARGINE 100 UNIT/ML ~~LOC~~ SOLN
20.0000 [IU] | Freq: Two times a day (BID) | SUBCUTANEOUS | Status: DC
  Administered 2014-04-08 – 2014-04-09 (×3): 20 [IU] via SUBCUTANEOUS
  Filled 2014-04-08 (×5): qty 0.2

## 2014-04-08 MED ORDER — ATORVASTATIN CALCIUM 80 MG PO TABS
80.0000 mg | ORAL_TABLET | Freq: Every day | ORAL | Status: DC
  Administered 2014-04-09: 80 mg via ORAL
  Filled 2014-04-08 (×2): qty 1

## 2014-04-08 MED ORDER — HEPARIN BOLUS VIA INFUSION
4000.0000 [IU] | Freq: Once | INTRAVENOUS | Status: AC
  Administered 2014-04-08: 4000 [IU] via INTRAVENOUS
  Filled 2014-04-08: qty 4000

## 2014-04-08 MED ORDER — HEPARIN (PORCINE) IN NACL 100-0.45 UNIT/ML-% IJ SOLN
1000.0000 [IU]/h | INTRAMUSCULAR | Status: DC
  Administered 2014-04-08: 1150 [IU]/h via INTRAVENOUS
  Filled 2014-04-08 (×2): qty 250

## 2014-04-08 MED ORDER — HYDROCODONE-ACETAMINOPHEN 5-325 MG PO TABS
1.0000 | ORAL_TABLET | ORAL | Status: DC | PRN
  Administered 2014-04-08 – 2014-04-09 (×2): 1 via ORAL
  Filled 2014-04-08 (×2): qty 1

## 2014-04-08 MED ORDER — SODIUM CHLORIDE 0.9 % IJ SOLN
3.0000 mL | Freq: Two times a day (BID) | INTRAMUSCULAR | Status: DC
  Administered 2014-04-08 – 2014-04-09 (×3): 3 mL via INTRAVENOUS

## 2014-04-08 MED ORDER — ONDANSETRON HCL 4 MG/2ML IJ SOLN: 4.0000 mg | Freq: Four times a day (QID) | INTRAMUSCULAR | Status: DC | PRN

## 2014-04-08 MED ORDER — AMLODIPINE BESYLATE 5 MG PO TABS
5.0000 mg | ORAL_TABLET | Freq: Every day | ORAL | Status: DC
  Administered 2014-04-09: 5 mg via ORAL
  Filled 2014-04-08 (×2): qty 1

## 2014-04-08 MED ORDER — ACETAMINOPHEN 325 MG PO TABS: 650.0000 mg | ORAL_TABLET | ORAL | Status: DC | PRN

## 2014-04-08 MED ORDER — INSULIN ASPART 100 UNIT/ML ~~LOC~~ SOLN
3.0000 [IU] | Freq: Three times a day (TID) | SUBCUTANEOUS | Status: DC
  Administered 2014-04-09 (×2): 3 [IU] via SUBCUTANEOUS

## 2014-04-08 MED ORDER — SODIUM CHLORIDE 0.9 % IV SOLN
INTRAVENOUS | Status: DC
  Administered 2014-04-08: 21:00:00 via INTRAVENOUS

## 2014-04-08 MED ORDER — NITROGLYCERIN 0.4 MG SL SUBL: 0.4000 mg | SUBLINGUAL_TABLET | SUBLINGUAL | Status: DC | PRN

## 2014-04-08 MED ORDER — AMLODIPINE-OLMESARTAN 5-40 MG PO TABS: 1.0000 | ORAL_TABLET | Freq: Every day | ORAL | Status: DC

## 2014-04-08 MED ORDER — INSULIN ASPART 100 UNIT/ML ~~LOC~~ SOLN
0.0000 [IU] | Freq: Three times a day (TID) | SUBCUTANEOUS | Status: DC
  Administered 2014-04-09 (×2): 3 [IU] via SUBCUTANEOUS

## 2014-04-08 MED ORDER — ACETAMINOPHEN 325 MG PO TABS: 325.0000 mg | ORAL_TABLET | Freq: Four times a day (QID) | ORAL | Status: DC | PRN

## 2014-04-08 MED ORDER — SODIUM CHLORIDE 0.9 % IJ SOLN
3.0000 mL | Freq: Two times a day (BID) | INTRAMUSCULAR | Status: DC
  Administered 2014-04-08: 3 mL via INTRAVENOUS

## 2014-04-08 MED ORDER — NITROGLYCERIN 2 % TD OINT
0.5000 [in_us] | TOPICAL_OINTMENT | TRANSDERMAL | Status: AC
  Administered 2014-04-08: 0.5 [in_us] via TOPICAL
  Filled 2014-04-08: qty 1

## 2014-04-08 MED ORDER — LIRAGLUTIDE 18 MG/3ML ~~LOC~~ SOLN: 1.8000 mg | Freq: Every day | SUBCUTANEOUS | Status: DC

## 2014-04-08 MED ORDER — SODIUM CHLORIDE 0.9 % IV SOLN
INTRAVENOUS | Status: DC
  Administered 2014-04-09: 04:00:00 via INTRAVENOUS

## 2014-04-08 NOTE — H&P (Signed)
Mary Harding is an 80 y.o. female.   Chief Complaint: Chest pain and shortness of breath.  HOPI:  Mary Harding premature female with history of hypertension, diabetes mellitus who I had seen in January 2015 when she presented with atypical chest pain.  Patient has been having occasional episodes of chest heaviness and also shortness of breath with exertion activity that started about 5-6 months ago.  However she had been doing well and she has had a low risk stress test in December 2014  Patient states that she was getting ready for seeing her ophthalmologist for sclerotherapy of her left eye for diabetic retinopathy, when she was getting ready at home she felt heaviness in the chest associated with performed shortness of breath.  She still got ready and went to see her ophthalmologist and continued to have chest tightness which was described as moderate.  No other associated nausea, vomiting or diaphoresis.  She was advised to see her PCP, but due to her multiple cardiac vascular risk factors and ongoing chest discomfort she was sent over to the emergency room via EMS.  Patient received sublingual nitroglycerin in the EMS, and felt that the chest pain completely subsided.  However again in the emergency room chest pain started to come back and was again relieved with applying sublingual nitroglycerin.  Because of ongoing chest discomfort I was called to see the patient.  Patient states that she has been fairly active, working in her yard yesterday, planted flowers and other plants in the yard and had no chest pain, chest tightness or shortness of breath.  All symptoms started earlier this morning.  Presently she states that since starting therapy she's feeling much better and her breathing also seems to improved next  She denies any symptoms to since claudication or TIA.  She does have visual disturbances due to diabetic retinopathy.  No recent weight changes.  Patient states that she has chronic  degenerative joint disease and back disease and takes pain medications for bilateral knee pain and back pain.  Past Medical History  Diagnosis Date  . Hypertension     takes Amlodipine daily  . Hyperlipidemia     takes Crestor daily  . Sleep apnea     sleep study done in 2012;uses CPAP  . Shortness of breath     with exertion  . Sciatica     left side  . Peripheral neuropathy     left  . Arthritis     mild  . Chronic back pain     hx ruptured disc;cyst on nerve ending  . Edema extremities     pt takes Torsemide daily  . GERD (gastroesophageal reflux disease)     takes Protonix daily  . Urinary frequency     takes Detrol daily  . Nocturia   . Blood transfusion 31yr ago  . Diabetes mellitus     takes Glimepiride,Metformin,and Victoza;Lantus bid  . Cataracts, bilateral   . History of shingles     89yrago    Past Surgical History  Procedure Laterality Date  . Lymph node dissection  45+yrs ago    left side  . Colonoscopy    . Esophagogastroduodenoscopy    . Cataract extraction w/phaco  02/14/2012    Procedure: CATARACT EXTRACTION PHACO AND INTRAOCULAR LENS PLACEMENT (IOC);  Surgeon: GrAdonis BrookMD;  [facility] Service: Ophthal[facility];  Laterality: Left;  . Pars plana vitrectomy  02/14/2012    Procedure: PARS PLANA VITRECTOMY WITH 23  GAUGE;  Surgeon: Adonis Brook, MD;  Location: [facility];  Service: Ophthalmology;  Laterality: Left;  . Refractive surgery      Family History  Problem Relation Age of Onset  . Anesthesia problems Neg Hx   . Hypotension Neg Hx   . Malignant hyperthermia Neg Hx   . Pseudochol deficiency Neg Hx    Social History:  reports that she has been smoking Cigarettes.  She has been smoking about 0.00 packs per day. She does not have any smokeless tobacco history on file. She reports that she does not drink alcohol or use illicit drugs.  Allergies:  Allergies  Allergen Reactions  . Bee Venom Anaphylaxis    Review of Systems - patient  denies any symptoms of TIA or claudication.  No recent weight changes.  She has significant chronic back pain.   Also has joint pains and knee pain.  No leg edema, no painful swelling of the lower extremities.  Diabetes is well controlled.  Other systems are negative.  Blood pressure 182/98, pulse 88, temperature 98.5 F (36.9 C), temperature source Oral, resp. rate 18, SpO2 100.00%. General: Well built and obese body habitus who is in no acute distress. Appears younger than stated age. Alert Ox3.   There is no cyanosis. HEENT: normal limits.No JVD.   CARDIAC EXAM: S1, S2 normal, no gallop present. No murmur.   CHEST EXAM: No tenderness of chest wall. LUNGS: Clear to percuss and auscultate.  ABDOMEN: No hepatosplenomegaly. BS normal in all 4 quadrants. Abdomen is non-tender.   EXTREMITY: Full range of movementes, No edema.   NEUROLOGIC EXAM: Grossly intact without any focal deficits. Alert O x 3.   VASCULAR EXAM: No skin breakdown. Carotids normal. Extremities: Femoral pulse normal. Popliteal pulse normal ; Pedal pulse normal.  Results for orders placed during the hospital encounter of 04/08/14 (from the past 48 hour(s))  CBC WITH DIFFERENTIAL     Status: Abnormal   Collection Time    04/08/14  3:55 PM      Result Value Ref Range   WBC 11.3 (*) 4.0 - 10.5 K/uL   RBC 3.91  3.87 - 5.11 MIL/uL   Hemoglobin 11.7 (*) 12.0 - 15.0 g/dL   HCT 36.5  36.0 - 46.0 %   MCV 93.4  78.0 - 100.0 fL   MCH 29.9  26.0 - 34.0 pg   MCHC 32.1  30.0 - 36.0 g/dL   RDW 14.4  11.5 - 15.5 %   Platelets 345  150 - 400 K/uL   Neutrophils Relative % 58  43 - 77 %   Neutro Abs 6.5  1.7 - 7.7 K/uL   Lymphocytes Relative 36  12 - 46 %   Lymphs Abs 4.1 (*) 0.7 - 4.0 K/uL   Monocytes Relative 5  3 - 12 %   Monocytes Absolute 0.6  0.1 - 1.0 K/uL   Eosinophils Relative 1  0 - 5 %   Eosinophils Absolute 0.1  0.0 - 0.7 K/uL   Basophils Relative 0  0 - 1 %   Basophils Absolute 0.0  0.0 - 0.1 K/uL   COMPREHENSIVE METABOLIC PANEL     Status: Abnormal   Collection Time    04/08/14  3:55 PM      Result Value Ref Range   Sodium 141  137 - 147 mEq/L   Potassium 4.7  3.7 - 5.3 mEq/L   Chloride 102  96 - 112 mEq/L   CO2 22  19 - 32  mEq/L   Glucose, Bld 267 (*) 70 - 99 mg/dL   BUN 14  6 - 23 mg/dL   Creatinine, Ser 1.10  0.50 - 1.10 mg/dL   Calcium 9.9  8.4 - 10.5 mg/dL   Total Protein 7.3  6.0 - 8.3 g/dL   Albumin 4.2  3.5 - 5.2 g/dL   AST 18  0 - 37 U/L   ALT 18  0 - 35 U/L   Alkaline Phosphatase 80  39 - 117 U/L   Total Bilirubin 0.3  0.3 - 1.2 mg/dL   GFR calc non Af Amer 47 (*) >90 mL/min   GFR calc Af Amer 55 (*) >90 mL/min   Comment: (NOTE)     The eGFR has been calculated using the CKD EPI equation.     This calculation has not been validated in all clinical situations.     eGFR's persistently <90 mL/min signify possible Chronic Kidney     Disease.  TROPONIN I     Status: None   Collection Time    04/08/14  3:55 PM      Result Value Ref Range   Troponin I <0.30  <0.30 ng/mL   Comment:            Due to the release kinetics of cTnI,     a negative result within the first hours     of the onset of symptoms does not rule out     myocardial infarction with certainty.     If myocardial infarction is still suspected,     repeat the test at appropriate intervals.  PROTIME-INR     Status: Abnormal   Collection Time    04/08/14  3:55 PM      Result Value Ref Range   Prothrombin Time 11.3 (*) 11.6 - 15.2 seconds   INR 0.83  0.00 - 1.49  PRO B NATRIURETIC PEPTIDE     Status: None   Collection Time    04/08/14  3:55 PM      Result Value Ref Range   Pro B Natriuretic peptide (BNP) 107.4  0 - 450 pg/mL   Dg Chest 2 View  04/08/2014   CLINICAL DATA:  Chest pain  EXAM: CHEST  2 VIEW  COMPARISON:  11/12/2013  FINDINGS: Cardiac shadow is within normal limits. The lungs are well aerated bilaterally without focal infiltrate or sizable effusion. No acute bony abnormality is seen.   IMPRESSION: No active cardiopulmonary disease.   Electronically Signed   By: Inez Catalina M.D.   On: 04/08/2014 15:49    Labs:   Lab Results  Component Value Date   WBC 11.3* 04/08/2014   HGB 11.7* 04/08/2014   HCT 36.5 04/08/2014   MCV 93.4 04/08/2014   PLT 345 04/08/2014    Recent Labs Lab 04/08/14 1555  NA 141  K 4.7  CL 102  CO2 22  BUN 14  CREATININE 1.10  CALCIUM 9.9  PROT 7.3  BILITOT 0.3  ALKPHOS 80  ALT 18  AST 18  GLUCOSE 267*   Lab Results  Component Value Date   TROPONINI <0.30 04/08/2014    Lipid Panel  No results found for this basename: chol, trig, hdl, cholhdl, vldl, ldlcalc    EKG: 04/08/2014: Normal sinus rhythm, normal axis, or R-wave progression.  No evidence of ischemia.  Outpatient work-up:  Echo- 12/01/13 1. Left ventricular internal dimension is decreased. Mild to moderate concentric hypertrophy. Normal global wall motion. Normal systolic global function. Calculated  EF 55%. Doppler evidence of Grade I (impaired) diastolic dysfunction with elevated LV filling pressure. 2. Left atrial cavity is slightly dilated. Mild aneurysmal motion of the interatrial septum.  Lexiscan Sestamibi Stress 11/21/2013: 1. Resting EKG NSR, Poor R wave progression. Non specific ST-Tchange, cannot r/o lateral ischemia. Stress EKG was non diagnostic for ischemia. 2. The perfusion study demonstrated mild inferior wall soft tissue attenuation artifact. There was no evidence of ischemia or scar. Dynamic gated images reveal normal wall motion and endocardial thickening. Left ventricular ejection fraction was estimated to be 59%. This is a low risk study.  Assessment/Plan 1.  Chest pain suggestive of unstable angina pectoris 2.  Shortness of breath and dyspnea on exertion 3.  Hypertension 4.  Diabetes mellitus type 2 controlled, with stage III chronic kidney disease, diabetic retinopathy 5.  Chronic degenerative joint disease and chronic back pain.  Recommendation: Patient  has significant cardiovascular risk factors and her presentation is very concerning for unstable angina pectoris with new onset of shortness of breath, dyspnea on exertion and also chest tightness that started this morning and is easily relieved with sublingual nitroglycerin.  Patient since being on IV nitroglycerin has not had any further chest pain.  I'll set her up for coronary angiography in the morning.  I discussed with the patient regarding less than 1% risk of death, stroke, MI, need for urgent CABG but not limited to these.  One to 2% risk of acute on chronic renal failure and need for dialysis was discussed with the patient.  I'll make further recommendation, we will continue to observe patient in the hospital and perform serial cardiac monitoring.  [name], MD 04/08/2014, 6:05 PM [facility] [place] Pager: [phone] Office: [phone] If no answer: Cell:  [phone]

## 2014-04-08 NOTE — ED Notes (Addendum)
Pt reports to the ED for eval of generalized chest tightness. The pain does not radiate. Reports associated SOB. Denies any N/V, diaphoresis, dizziness, or lightheadedness. Pt went to her PCPs office and was given 324 of ASA and 1 nitro spray in the office. Pt reports her pain went from a 5/10 to a 0/10. Pt denies any CP or SOB at this time. 12 lead showed NSR. Pt hypertensive upon arrival of 202/97. Reports a slight HA after the nitro administration. Pt A&Ox4, resp e/u, and skin warm and dry.

## 2014-04-08 NOTE — ED Provider Notes (Signed)
CSN: [id number]     Arrival date & time 04/08/14  1432 History   First MD Initiated Contact with Patient 04/08/14 1432     Chief Complaint  Patient presents with  . Chest Pain     (Consider location/radiation/quality/duration/timing/severity/associated sxs/prior Treatment) Patient is a 80 y.o. female presenting with chest pain. The history is provided by the patient.  Chest Pain Pain location:  Substernal area Pain quality: pressure and tightness   Pain radiates to:  Does not radiate Pain radiates to the back: yes   Pain severity:  Mild Onset quality:  Gradual Timing:  Constant Progression since onset: resolved w/ nitro/asa now it has returned. Chronicity:  New Context comment:  While getting ready to see the eye doctor Relieved by:  Nitroglycerin and aspirin Worsened by:  Nothing tried Ineffective treatments:  None tried Associated symptoms: shortness of breath   Associated symptoms: no abdominal pain, no back pain, no cough, no dizziness, no fatigue, no fever, no headache, no nausea and not vomiting     Past Medical History  Diagnosis Date  . Hypertension     takes Amlodipine daily  . Hyperlipidemia     takes Crestor daily  . Sleep apnea     sleep study done in 2012;uses CPAP  . Shortness of breath     with exertion  . Sciatica     left side  . Peripheral neuropathy     left  . Arthritis     mild  . Chronic back pain     hx ruptured disc;cyst on nerve ending  . Edema extremities     pt takes Torsemide daily  . GERD (gastroesophageal reflux disease)     takes Protonix daily  . Urinary frequency     takes Detrol daily  . Nocturia   . Blood transfusion 34yrs ago  . Diabetes mellitus     takes Glimepiride,Metformin,and Victoza;Lantus bid  . Cataracts, bilateral   . History of shingles     86yrs ago   Past Surgical History  Procedure Laterality Date  . Lymph node dissection  45+yrs ago    left side  . Colonoscopy    . Esophagogastroduodenoscopy    .  Cataract extraction w/phaco  02/14/2012    Procedure: CATARACT EXTRACTION PHACO AND INTRAOCULAR LENS PLACEMENT (IOC);  Surgeon: Adonis Brook, MD;  Location: [facility];  Service: Ophthalmology;  Laterality: Left;  . Pars plana vitrectomy  02/14/2012    Procedure: PARS PLANA VITRECTOMY WITH 23 GAUGE;  Surgeon: Adonis Brook, MD;  Location: [facility];  Service: Ophthalmology;  Laterality: Left;  . Refractive surgery     Family History  Problem Relation Age of Onset  . Anesthesia problems Neg Hx   . Hypotension Neg Hx   . Malignant hyperthermia Neg Hx   . Pseudochol deficiency Neg Hx    History  Substance Use Topics  . Smoking status: Current Some Day Smoker    Types: Cigarettes  . Smokeless tobacco: Not on file  . Alcohol Use: No   OB History   Grav Para Term Preterm Abortions TAB SAB Ect Mult Living                 Review of Systems  Constitutional: Negative for fever and fatigue.  HENT: Negative for congestion and drooling.   Eyes: Negative for pain.  Respiratory: Positive for shortness of breath. Negative for cough.   Cardiovascular: Positive for chest pain.  Gastrointestinal: Negative for nausea, vomiting, abdominal pain and diarrhea.  Genitourinary: Negative for dysuria and hematuria.  Musculoskeletal: Negative for back pain, gait problem and neck pain.  Skin: Negative for color change.  Neurological: Negative for dizziness and headaches.  Hematological: Negative for adenopathy.  Psychiatric/Behavioral: Negative for behavioral problems.  All other systems reviewed and are negative.     Allergies  Review of patient's allergies indicates no known allergies.  Home Medications   Prior to Admission medications   Medication Sig Start Date End Date Taking? Authorizing Provider  amLODipine (NORVASC) 5 MG tablet Take 5 mg by mouth daily.    Historical Provider, MD  celecoxib (CELEBREX) 200 MG capsule Take 200 mg by mouth daily.    Historical Provider, MD  cyclobenzaprine (FLEXERIL)  5 MG tablet Take 5 mg by mouth daily as needed. For spasms    Historical Provider, MD  glimepiride (AMARYL) 2 MG tablet Take 2 mg by mouth daily before breakfast.    Historical Provider, MD  HYDROcodone-acetaminophen (VICODIN) 5-500 MG per tablet Take 1 tablet by mouth every 6 (six) hours as needed. For pain    Historical Provider, MD  insulin glargine (LANTUS) 100 UNIT/ML injection Inject 10-20 Units into the skin 3 (three) times daily. Pt's blood sugar is between 80-120 she uses 10 units if blood sugar is > 120 she uses 20 units    Historical Provider, MD  Liraglutide (VICTOZA) 18 MG/3ML SOLN Inject 1.8 mg into the skin daily.     Historical Provider, MD  metFORMIN (GLUCOPHAGE) 1000 MG tablet Take 1,000 mg by mouth 2 (two) times daily with a meal.    Historical Provider, MD  pantoprazole (PROTONIX) 40 MG tablet Take 40 mg by mouth daily.    Historical Provider, MD  rosuvastatin (CRESTOR) 5 MG tablet Take 5 mg by mouth daily.    Historical Provider, MD  tolterodine (DETROL LA) 4 MG 24 hr capsule Take 4 mg by mouth daily.    Historical Provider, MD  torsemide (DEMADEX) 10 MG tablet Take 10 mg by mouth daily.    Historical Provider, MD   BP 202/97  Pulse 87  Temp(Src) 98.5 F (36.9 C) (Oral)  Resp 18  SpO2 100% Physical Exam  Nursing note and vitals reviewed. Constitutional: She is oriented to person, place, and time. She appears well-developed and well-nourished.  HENT:  Head: Normocephalic.  Mouth/Throat: Oropharynx is clear and moist. No oropharyngeal exudate.  Eyes: Conjunctivae and EOM are normal. Pupils are equal, round, and reactive to light.  Neck: Normal range of motion. Neck supple.  Cardiovascular: Normal rate, regular rhythm, normal heart sounds and intact distal pulses.  Exam reveals no gallop and no friction rub.   No murmur heard. Pulmonary/Chest: Effort normal and breath sounds normal. No respiratory distress. She has no wheezes.  Abdominal: Soft. Bowel sounds are normal.  There is no tenderness. There is no rebound and no guarding.  Musculoskeletal: Normal range of motion. She exhibits no edema and no tenderness.  Neurological: She is alert and oriented to person, place, and time.  Skin: Skin is warm and dry.  Psychiatric: She has a normal mood and affect. Her behavior is normal.    ED Course  Procedures (including critical care time) Labs Review Labs Reviewed  CBC WITH DIFFERENTIAL - Abnormal; Notable for the following:    WBC 11.3 (*)    Hemoglobin 11.7 (*)    Lymphs Abs 4.1 (*)    All other components within normal limits  COMPREHENSIVE METABOLIC PANEL - Abnormal; Notable for the following:  Glucose, Bld 267 (*)    GFR calc non Af Amer 47 (*)    GFR calc Af Amer 55 (*)    All other components within normal limits  PROTIME-INR - Abnormal; Notable for the following:    Prothrombin Time 11.3 (*)    All other components within normal limits  HEPARIN LEVEL (UNFRACTIONATED) - Abnormal; Notable for the following:    Heparin Unfractionated 1.12 (*)    All other components within normal limits  CBC - Abnormal; Notable for the following:    RBC 3.27 (*)    Hemoglobin 9.8 (*)    HCT 30.6 (*)    All other components within normal limits  APTT - Abnormal; Notable for the following:    aPTT 173 (*)    All other components within normal limits  HEMOGLOBIN A1C - Abnormal; Notable for the following:    Hemoglobin A1C 9.0 (*)    Mean Plasma Glucose 212 (*)    All other components within normal limits  LIPID PANEL - Abnormal; Notable for the following:    HDL 33 (*)    All other components within normal limits  BASIC METABOLIC PANEL - Abnormal; Notable for the following:    Glucose, Bld 236 (*)    Creatinine, Ser 1.13 (*)    GFR calc non Af Amer 46 (*)    GFR calc Af Amer 53 (*)    All other components within normal limits  GLUCOSE, CAPILLARY - Abnormal; Notable for the following:    Glucose-Capillary 238 (*)    All other components within normal  limits  GLUCOSE, CAPILLARY - Abnormal; Notable for the following:    Glucose-Capillary 170 (*)    All other components within normal limits  CBC - Abnormal; Notable for the following:    WBC 10.7 (*)    RBC 3.17 (*)    Hemoglobin 9.3 (*)    HCT 29.4 (*)    All other components within normal limits  GLUCOSE, CAPILLARY - Abnormal; Notable for the following:    Glucose-Capillary 174 (*)    All other components within normal limits  GLUCOSE, CAPILLARY - Abnormal; Notable for the following:    Glucose-Capillary 212 (*)    All other components within normal limits  GLUCOSE, CAPILLARY - Abnormal; Notable for the following:    Glucose-Capillary 193 (*)    All other components within normal limits  TROPONIN I  PRO B NATRIURETIC PEPTIDE  TROPONIN I  TROPONIN I  CBC    Imaging Review Dg Chest 2 View  04/08/2014   CLINICAL DATA:  Chest pain  EXAM: CHEST  2 VIEW  COMPARISON:  11/12/2013  FINDINGS: Cardiac shadow is within normal limits. The lungs are well aerated bilaterally without focal infiltrate or sizable effusion. No acute bony abnormality is seen.  IMPRESSION: No active cardiopulmonary disease.   Electronically Signed   By: Inez Catalina M.D.   On: 04/08/2014 15:49     EKG Interpretation   Date/Time:  Wednesday April 08 2014 14:52:57 EDT Ventricular Rate:  89 PR Interval:  222 QRS Duration: 90 QT Interval:  389 QTC Calculation: 473 R Axis:   0 Text Interpretation:  Sinus rhythm Prolonged PR interval No significant  change since last tracing Confirmed by Dickie Labarre  MD, Briany Aye ([id number]) on  04/08/2014 2:59:21 PM      MDM   Final diagnoses:  Unstable angina    2:57 PM 80 y.o. female w hx of HTN, HLP, DM, smoker, FH of heart  dis who presents with chest tightness and shortness of breath which began at approximately 9 AM this morning while getting ready to go to the eye doctor. She notes that her symptoms were unwavering and remained persistent until she received aspirin and  nitroglycerin tablets by EMS. She is afebrile and hypertensive here. She notes that her pain was initially relieved by the nitroglycerin but has returned and is currently a 5/10. She otherwise appears well on exam. Will get screening imaging and labs.  Dr. Einar Gip to admit.   Blanchard Kelch, MD 04/09/14 2053

## 2014-04-08 NOTE — Progress Notes (Signed)
ANTICOAGULATION CONSULT NOTE - Initial Consult  Pharmacy Consult for Heparin  Indication: chest pain/ACS  Allergies  Allergen Reactions  . Bee Venom Anaphylaxis    Patient Measurements:   Heparin Dosing Weight: 73 kg   Vital Signs: Temp: 98.5 F (36.9 C) (04/22 1443) Temp src: Oral (04/22 1443) BP: 171/83 mmHg (04/22 1657) Pulse Rate: 79 (04/22 1657)  Labs:  Recent Labs  04/08/14 1555  HGB 11.7*  HCT 36.5  PLT 345  LABPROT 11.3*  INR 0.83  CREATININE 1.10  TROPONINI <0.30    The CrCl is unknown because both a height and weight (above a minimum accepted value) are required for this calculation.   Medical History: Past Medical History  Diagnosis Date  . Hypertension     takes Amlodipine daily  . Hyperlipidemia     takes Crestor daily  . Sleep apnea     sleep study done in 2012;uses CPAP  . Shortness of breath     with exertion  . Sciatica     left side  . Peripheral neuropathy     left  . Arthritis     mild  . Chronic back pain     hx ruptured disc;cyst on nerve ending  . Edema extremities     pt takes Torsemide daily  . GERD (gastroesophageal reflux disease)     takes Protonix daily  . Urinary frequency     takes Detrol daily  . Nocturia   . Blood transfusion 76yrs ago  . Diabetes mellitus     takes Glimepiride,Metformin,and Victoza;Lantus bid  . Cataracts, bilateral   . History of shingles     19yrs ago    Medications:   (Not in a hospital admission)  Assessment: 3 YOF presented to the ED with chest pain. Initial troponin negative. Pharmacy to start heparin infusion for f/o UA. She is not on any anticoagulation prior to admission. Hgb 11.7, Plt 345.   Goal of Therapy:  Heparin level 0.3-0.7 units/ml Monitor platelets by anticoagulation protocol: Yes   Plan:  Give 4000 units bolus x 1 Start heparin infusion at 1150  units/hr Check anti-Xa level in 8 hours and daily while on heparin Continue to monitor H&H and platelets  Albertina Parr, PharmD.  Clinical Pharmacist Pager [phone]

## 2014-04-09 ENCOUNTER — Encounter (HOSPITAL_COMMUNITY)
Admission: EM | Disposition: A | Discharge status: Home / Self Care | Payer: Self-pay | Source: Home / Self Care | Attending: Cardiology

## 2014-04-09 DIAGNOSIS — E1129 Type 2 diabetes mellitus with other diabetic kidney complication: Secondary | ICD-10-CM | POA: Diagnosis not present

## 2014-04-09 DIAGNOSIS — I2510 Atherosclerotic heart disease of native coronary artery without angina pectoris: Secondary | ICD-10-CM | POA: Diagnosis not present

## 2014-04-09 DIAGNOSIS — I200 Unstable angina: Secondary | ICD-10-CM | POA: Diagnosis not present

## 2014-04-09 HISTORY — PX: LEFT HEART CATHETERIZATION WITH CORONARY ANGIOGRAM: SHX5451

## 2014-04-09 LAB — BASIC METABOLIC PANEL
BUN: 12 mg/dL (ref 6–23)
CHLORIDE: 105 meq/L (ref 96–112)
CO2: 20 meq/L (ref 19–32)
CREATININE: 1.13 mg/dL — AB (ref 0.50–1.10)
Calcium: 9 mg/dL (ref 8.4–10.5)
GFR calc Af Amer: 53 mL/min — ABNORMAL LOW (ref >90)
GFR calc non Af Amer: 46 mL/min — ABNORMAL LOW (ref >90)
Glucose, Bld: 236 mg/dL — ABNORMAL HIGH (ref 70–99)
POTASSIUM: 4 meq/L (ref 3.7–5.3)
Sodium: 140 mEq/L (ref 137–147)

## 2014-04-09 LAB — CBC
HCT: 30.6 % — ABNORMAL LOW (ref 36.0–46.0)
HCT: 29.4 % — ABNORMAL LOW (ref 36.0–46.0)
Hemoglobin: 9.8 g/dL — ABNORMAL LOW (ref 12.0–15.0)
Hemoglobin: 9.3 g/dL — ABNORMAL LOW (ref 12.0–15.0)
MCH: 30 pg (ref 26.0–34.0)
MCH: 29.3 pg (ref 26.0–34.0)
MCHC: 32 g/dL (ref 30.0–36.0)
MCHC: 31.6 g/dL (ref 30.0–36.0)
MCV: 93.6 fL (ref 78.0–100.0)
MCV: 92.7 fL (ref 78.0–100.0)
PLATELETS: 276 10*3/uL (ref 150–400)
Platelets: 280 10*3/uL (ref 150–400)
RBC: 3.27 MIL/uL — AB (ref 3.87–5.11)
RBC: 3.17 MIL/uL — ABNORMAL LOW (ref 3.87–5.11)
RDW: 14.4 % (ref 11.5–15.5)
RDW: 14.4 % (ref 11.5–15.5)
WBC: 10.4 10*3/uL (ref 4.0–10.5)
WBC: 10.7 10*3/uL — ABNORMAL HIGH (ref 4.0–10.5)

## 2014-04-09 LAB — GLUCOSE, CAPILLARY
GLUCOSE-CAPILLARY: 170 mg/dL — AB (ref 70–99)
Glucose-Capillary: 193 mg/dL — ABNORMAL HIGH (ref 70–99)
Glucose-Capillary: 212 mg/dL — ABNORMAL HIGH (ref 70–99)
Glucose-Capillary: 174 mg/dL — ABNORMAL HIGH (ref 70–99)
Glucose-Capillary: 215 mg/dL — ABNORMAL HIGH (ref 70–99)

## 2014-04-09 LAB — TROPONIN I

## 2014-04-09 LAB — LIPID PANEL
Cholesterol: 94 mg/dL (ref 0–200)
HDL: 33 mg/dL — ABNORMAL LOW (ref >39)
LDL CALC: 36 mg/dL (ref 0–99)
Total CHOL/HDL Ratio: 2.8 RATIO
Triglycerides: 124 mg/dL (ref <150)
VLDL: 25 mg/dL (ref 0–40)

## 2014-04-09 LAB — HEPARIN LEVEL (UNFRACTIONATED): Heparin Unfractionated: 1.12 IU/mL — ABNORMAL HIGH (ref 0.30–0.70)

## 2014-04-09 SURGERY — LEFT HEART CATHETERIZATION WITH CORONARY ANGIOGRAM
Anesthesia: LOCAL

## 2014-04-09 MED ORDER — MIDAZOLAM HCL 2 MG/2ML IJ SOLN
INTRAMUSCULAR | Status: AC
  Filled 2014-04-09: qty 2

## 2014-04-09 MED ORDER — HYDROMORPHONE HCL PF 1 MG/ML IJ SOLN
INTRAMUSCULAR | Status: AC
  Filled 2014-04-09: qty 1

## 2014-04-09 MED ORDER — NITROGLYCERIN 0.2 MG/ML ON CALL CATH LAB
INTRAVENOUS | Status: AC
  Filled 2014-04-09: qty 1

## 2014-04-09 MED ORDER — FENTANYL CITRATE 0.05 MG/ML IJ SOLN
INTRAMUSCULAR | Status: AC
  Filled 2014-04-09: qty 2

## 2014-04-09 MED ORDER — VERAPAMIL HCL 2.5 MG/ML IV SOLN
INTRAVENOUS | Status: AC
  Filled 2014-04-09: qty 2

## 2014-04-09 MED ORDER — HEPARIN SODIUM (PORCINE) 1000 UNIT/ML IJ SOLN
INTRAMUSCULAR | Status: AC
  Filled 2014-04-09: qty 1

## 2014-04-09 MED ORDER — LIDOCAINE HCL (PF) 1 % IJ SOLN
INTRAMUSCULAR | Status: AC
  Filled 2014-04-09: qty 30

## 2014-04-09 MED ORDER — HEPARIN (PORCINE) IN NACL 2-0.9 UNIT/ML-% IJ SOLN
INTRAMUSCULAR | Status: AC
  Filled 2014-04-09: qty 1000

## 2014-04-09 MED ORDER — ASPIRIN EC 325 MG PO TBEC
325.0000 mg | DELAYED_RELEASE_TABLET | Freq: Every day | ORAL | Status: DC
  Administered 2014-04-09: 325 mg via ORAL
  Filled 2014-04-09 (×3): qty 1

## 2014-04-09 MED ORDER — HEPARIN (PORCINE) IN NACL 2-0.9 UNIT/ML-% IJ SOLN
INTRAMUSCULAR | Status: AC
  Filled 2014-04-09: qty 1500

## 2014-04-09 MED ORDER — SODIUM CHLORIDE 0.9 % IV SOLN: 1.0000 mL/kg/h | INTRAVENOUS | Status: AC

## 2014-04-09 NOTE — Interval H&P Note (Signed)
History and Physical Interval Note:  04/09/2014 7:41 AM  Mary Harding  has presented today for surgery, with the diagnosis of cp  The various methods of treatment have been discussed with the patient and family. After consideration of risks, benefits and other options for treatment, the patient has consented to  Procedure(s): LEFT HEART CATHETERIZATION WITH CORONARY ANGIOGRAM (N/A) and possible PCI as a surgical intervention .  The patient's history has been reviewed, patient examined, no change in status, stable for surgery.  I have reviewed the patient's chart and labs.  Questions were answered to the patient's satisfaction.     [name]

## 2014-04-09 NOTE — CV Procedure (Addendum)
Procedure performed:  Left heart catheterization including hemodynamic monitoring of the left ventricle, LV gram, selective right and left coronary arteriography.  Indication patient is a 80 year-old [place] American  female with history of hypertension,  hyperlipidemia,  Diabetes Mellitus   who presents with unstable angina pectoris.  Hence is brought to the cardiac catheterization lab to evaluate the  coronary anatomy for definitive diagnosis of CAD.  Hemodynamic data:  Left ventricular pressure was 142/-7 with LVEDP of 3 mm mercury. Aortic pressure was 142/69 with a mean of 112 mm mercury. There was no pressure gradient across the aortic valve  Left ventricle: Performed in the RAO projection revealed LVEF of 65-70%, there was evidence of mid cavitary obliteration. %. There was no significant MR. no significant wall motion abnormality.  Right coronary artery:  Dominant. The right coronary artery in the midsegment has a 40% stenosis, the proximal and mid right coronary artery shows mild luminal diffuse irregularity. Distal RCA gives origin to large PDA and PL branch without any luminal narrowing.  Left main coronary artery is large and normal.  Circumflex coronary artery: A large vessel giving origin to a large high obtuse marginal 1. The circumflex coronary artery has diffuse luminal irregularity, OM 2 is small. The proximal circumflex coronary artery after the origin of OM1 has a 30% to 35% mild luminal irregularity. The OM1 and the distal end has a ulcerated 70- 80% stenosis with a brisk TIMI 3 flow, the ulceration appears to be healing. There was no contrast hang up.  LAD:  LAD gives origin to a small D1 which has a ostial 90% stenosis. At this site, the LAD has a 40% stenosis. Immediately there is a tandem D2 origin, which has ostial 30% stenosis. The LAD itself has mild diffuse luminal irregularity. Proximal coronary calcification is evident. No high-grade stenosis enlarged epicardial vessel  is present   Impression: The culprit vessel for unstable angina pectoris appears to be  the obtuse marginal distal stenosis. However the lesion appears to be well-healed and does not appear to be unstable at this point. Patient has dropped her hemoglobin this morning, has chronic renal insufficiency, is a distal vessel although it is 2. 25 - 2.5 mm vessel, but her treated medically unless she has recurrent angina pectoris. She needs aggressive risk modification. Patient also has history of diabetic retinopathy and retinal bleed. Hence I would prefer medical therapy for now. A total of 60 cc of contrast was utilized for diagnostic angiography.  Technique: Under sterile precautions using a 6 French right radial  arterial access, a 6 French sheath was introduced into the right radial artery. A 5 [place] Tig 4 catheter was advanced into the ascending aorta selective  right coronary artery and left coronary artery was cannulated and angiography was performed in multiple views. The catheter was pulled back Out of the body over exchange length J-wire. Same Catheter was used to perform LV gram which was performed in RAO projection.  Catheter exchanged out of the body over J-Wire. NO immediate complications noted. Patient tolerated the procedure well.   Rec: Medical therapy with aggressive risk factor reduction.

## 2014-04-09 NOTE — Care Management Note (Unsigned)
    Page 1 of 1   04/09/2014     4:21:31 PM CARE MANAGEMENT NOTE 04/09/2014  Patient:  Mary Harding, Mary Harding   Account Number:  [id number]  Date Initiated:  04/09/2014  Documentation initiated by:  Worth Kober  Subjective/Objective Assessment:   Pt adm on 04/08/14 with unstable angina.  Pt independent and from home prior to admission.     Action/Plan:   Will follow for dc needs as pt progresses.   Anticipated DC Date:  04/11/2014   Anticipated DC Plan:  [place]  CM consult      Choice offered to / List presented to:             Status of service:  In process, will continue to follow Medicare Important Message given?   (If response is "NO", the following Medicare IM given date fields will be blank) Date Medicare IM given:   Date Additional Medicare IM given:    Discharge Disposition:    Per UR Regulation:  Reviewed for med. necessity/level of care/duration of stay  If discussed at [facility] of Stay Meetings, dates discussed:    Comments:

## 2014-04-09 NOTE — Progress Notes (Signed)
[facility] for Heparin  Indication: chest pain/ACS  Allergies  Allergen Reactions  . Bee Venom Anaphylaxis    Patient Measurements: Height: 5' 4.8" (164.6 cm) Weight: 178 lb 9.2 oz (81 kg) IBW/kg (Calculated) : 56.54 Heparin Dosing Weight: 73 kg   Vital Signs: Temp: 97.9 F (36.6 C) (04/22 2108) Temp src: Oral (04/22 2108) BP: 137/70 mmHg (04/22 2108) Pulse Rate: 84 (04/22 2108)  Labs:  Recent Labs  04/08/14 1555 04/08/14 1835 04/09/14 0028 04/09/14 0256  HGB 11.7*  --   --  9.8*  HCT 36.5  --   --  30.6*  PLT 345  --   --  276  APTT  --  173*  --   --   LABPROT 11.3*  --   --   --   INR 0.83  --   --   --   HEPARINUNFRC  --   --   --  1.12*  CREATININE 1.10  --  1.13*  --   TROPONINI <0.30 <0.30  --  <0.30    Estimated Creatinine Clearance: 43.6 ml/min (by C-G formula based on Cr of 1.13).  Assessment: 80 y.o. female with chest pain for heparin   Goal of Therapy:  Heparin level 0.3-0.7 units/ml Monitor platelets by anticoagulation protocol: Yes   Plan:  Hold heparin x 45 min, then decrease heparin 1000 units/hr F/U after cath  Phillis Knack, PharmD, BCPS

## 2014-04-10 DIAGNOSIS — E1129 Type 2 diabetes mellitus with other diabetic kidney complication: Secondary | ICD-10-CM | POA: Diagnosis not present

## 2014-04-10 DIAGNOSIS — I2510 Atherosclerotic heart disease of native coronary artery without angina pectoris: Principal | ICD-10-CM

## 2014-04-10 DIAGNOSIS — E1165 Type 2 diabetes mellitus with hyperglycemia: Secondary | ICD-10-CM

## 2014-04-10 DIAGNOSIS — I200 Unstable angina: Secondary | ICD-10-CM | POA: Diagnosis not present

## 2014-04-10 DIAGNOSIS — IMO0001 Reserved for inherently not codable concepts without codable children: Secondary | ICD-10-CM

## 2014-04-10 LAB — CBC
HEMATOCRIT: 32.6 % — AB (ref 36.0–46.0)
Hemoglobin: 10.3 g/dL — ABNORMAL LOW (ref 12.0–15.0)
MCH: 29.9 pg (ref 26.0–34.0)
MCHC: 31.6 g/dL (ref 30.0–36.0)
MCV: 94.8 fL (ref 78.0–100.0)
Platelets: 277 10*3/uL (ref 150–400)
RBC: 3.44 MIL/uL — ABNORMAL LOW (ref 3.87–5.11)
RDW: 14.6 % (ref 11.5–15.5)
WBC: 8.6 10*3/uL (ref 4.0–10.5)

## 2014-04-10 LAB — GLUCOSE, CAPILLARY: Glucose-Capillary: 123 mg/dL — ABNORMAL HIGH (ref 70–99)

## 2014-04-10 MED ORDER — CLOPIDOGREL BISULFATE 75 MG PO TABS: 75.0000 mg | ORAL_TABLET | Freq: Every day | ORAL | Status: DC

## 2014-04-10 MED ORDER — ROSUVASTATIN CALCIUM 5 MG PO TABS: 20.0000 mg | ORAL_TABLET | Freq: Every day | ORAL | Status: DC

## 2014-04-10 NOTE — Discharge Instructions (Signed)
Acute Coronary Syndrome °Acute coronary syndrome (ACS) is an urgent problem in which the blood and oxygen supply to the heart is critically deficient. ACS requires hospitalization because one or more coronary arteries may be blocked. °ACS represents a range of conditions including: °· Previous angina that is now unstable, lasts longer, happens at rest, or is more intense. °· A heart attack, with heart muscle cell injury and death. °There are three vital coronary arteries that supply the heart muscle with blood and oxygen so that it can pump blood effectively. If blockages to these arteries develop, blood flow to the heart muscle is reduced. If the heart does not get enough blood, angina may occur as the first warning sign. °SYMPTOMS  °· The most common signs of angina include: °· Tightness or squeezing in the chest. °· Feeling of heaviness on the chest. °· Discomfort in the arms, neck, or jaw. °· Shortness of breath and nausea. °· Cold, wet skin. °· Angina is usually brought on by physical effort or excitement which increase the oxygen needs of the heart. These states increase the blood flow needs of the heart beyond what can be delivered. °TREATMENT  °· Medicines to help discomfort may include nitroglycerin (nitro) in the form of tablets or a spray for rapid relief, or longer-acting forms such as cream, patches, or capsules. (Be aware that there are many side effects and possible interactions with other drugs). °· Other medicines may be used to help the heart pump better. °· Procedures to open blocked arteries including angioplasty or stent placement to keep the arteries open. °· Open heart surgery may be needed when there are many blockages or they are in critical locations that are best treated with surgery. °HOME CARE INSTRUCTIONS  °· Avoid smoking. °· Take one baby or adult aspirin daily, if your caregiver advises. This helps reduce the risk of a heart attack. °· It is very important that you follow the angina  treatment prescribed by your caregiver. Make arrangements for proper follow-up care. °· Eat a heart healthy diet with salt and fat restrictions as advised. °· Regular exercise is good for you as long as it does not cause discomfort. Do not begin any new type of exercise until you check with your caregiver. °· If you are overweight, you should lose weight. °· Try to maintain normal blood lipid levels. °· Keep your blood pressure under control as recommended by your caregiver. °· You should tell your caregiver right away about any increase in the severity or frequency of your chest discomfort or angina attacks. When you have angina, you should stop what you are doing and sit down. This may bring relief in 3 to 5 minutes. If your caregiver has prescribed nitro, take it as directed. °· If your caregiver has given you a follow-up appointment, it is very important to keep that appointment. Not keeping the appointment could result in a chronic or permanent injury, pain, and disability. If there is any problem keeping the appointment, you must call back to this facility for assistance. °SEEK IMMEDIATE MEDICAL CARE IF:  °· You develop nausea, vomiting, or shortness of breath. °· You feel faint, lightheaded, or pass out. °· Your chest discomfort gets worse. °· You are sweating or experience sudden profound fatigue. °· You do not get relief of your chest pain after 3 doses of nitro. °· Your discomfort lasts longer than 15 minutes. °MAKE SURE YOU:  °· Understand these instructions. °· Will watch your condition. °· Will get help   right away if you are not doing well or get worse. °Document Released: 12/04/2005 Document Revised: 02/26/2012 Document Reviewed: 07/07/2008 °[employer]® Patient Information ©2014 [employer]. ° °

## 2014-04-10 NOTE — Progress Notes (Signed)
Pt d/c home.  A&O x4.  No c/o pain.  Education given on diet, activity, meds, and follow-up care and appts.  Pt verbalized understanding.  IVs D/C'd.  Tele D/c'd.  Pt taken home via private vehicle.  (Daughter).

## 2014-04-10 NOTE — Clinical Documentation Improvement (Signed)
Possible Clinical Conditions?   _______Diabetes Type 1  or 2 _______Controlled or Uncontrolled  _______Other Condition _______Cannot Clinically determine    Risk Factors: History of diabetes mellitus per 4/22 progress notes.  Diagnostics: 04/08/14: Hgb A1c: 9.0 Mean plasma glucose: 212 Glucose, blood: 267   Thank You, Theron Arista, Clinical Documentation Specialist:  [facility] Information Management

## 2014-04-10 NOTE — Progress Notes (Signed)
1010-114 Pt off monitor so did not walk with pt. Ready to go home soon. Gave education re risk factor modification, NTG use, exercise ed, diet, and CRP 2. Pt would like to attend CRP 2 if cardiologist will write stable angina diagnosis. Told pt will refer and let Phase 2 staff follow up. Discussed smoking cessation and gave handouts. Pt stated will not be hard to qit as she had quit before. Will do cold [place].  Voiced understanding of all ed. Graylon Good RN BSN 04/10/2014 11:13 AM

## 2014-04-10 NOTE — Discharge Summary (Signed)
Physician Discharge Summary  Patient ID: Mary Harding MRN: [id number] DOB/AGE: [birth year] 80 y.o.  Admit date: 04/08/2014 Discharge date: 04/10/2014  Primary Discharge Diagnosis 1.  Unstable angina pectoris due to involvement of the OM branch of circumflex coronary artery.  Myocardial infarction was ruled out by serial markers. 2.  CAD of the native coronary vessels Secondary Discharge Diagnosis 1. Chronic back pain. 2. Shortness of breath and dyspnea on exertion  3. Hypertension  4. Diabetes mellitus type 2 cunontrolled, with stage III chronic kidney disease, diabetic retinopathy.  5. Chronic degenerative joint disease and chronic back pain.   Significant Diagnostic Studies: 04/09/2014: Left heart catheterization including hemodynamic monitoring of the left ventricle, LV gram, selective right and left coronary arteriography.   Hemodynamic data:  Left ventricular pressure was 142/-7 with LVEDP of 3 mm mercury. Aortic pressure was 142/69 with a mean of 112 mm mercury. There was no pressure gradient across the aortic valve   Left ventricle: Performed in the RAO projection revealed LVEF of 65-70%, there was evidence of mid cavitary obliteration. %. There was no significant MR. no significant wall motion abnormality.   Right coronary artery: Dominant. The right coronary artery in the midsegment has a 40% stenosis, the proximal and mid right coronary artery shows mild luminal diffuse irregularity. Distal RCA gives origin to large PDA and PL branch without any luminal narrowing.   Left main coronary artery is large and normal.   Circumflex coronary artery: A large vessel giving origin to a large high obtuse marginal 1. The circumflex coronary artery has diffuse luminal irregularity, OM 2 is small. The proximal circumflex coronary artery after the origin of OM1 has a 30% to 35% mild luminal irregularity. The OM1 and the distal end has a ulcerated 70- 80% stenosis with a brisk TIMI 3 flow, the  ulceration appears to be healing. There was no contrast hang up.   LAD: LAD gives origin to a small D1 which has a ostial 90% stenosis. At this site, the LAD has a 40% stenosis. Immediately there is a tandem D2 origin, which has ostial 30% stenosis. The LAD itself has mild diffuse luminal irregularity. Proximal coronary calcification is evident. No high-grade stenosis enlarged epicardial vessel is present   Hospital Course: Mrs. Adalynne Fekete premature female with history of hypertension, diabetes mellitus who I had seen in January 2015 when she presented with atypical chest pain. Patient has been having occasional episodes of chest heaviness and also shortness of breath with exertion activity that started about 5-6 months ago. However she had been doing well and she has had a low risk stress test in December 2014.  Patient states that she was getting ready for seeing her ophthalmologist for sclerotherapy of her left eye for diabetic retinopathy, when she was getting ready at home she felt heaviness in the chest associated with performed shortness of breath. She still got ready and went to see her ophthalmologist and continued to have chest tightness which was described as moderate. She presented to the emergency room and due to ongoing chest discomfort she was admitted to the hospital.  She underwent coronary angiography the following morning. The culprit vessel for unstable angina pectoris appears to be the obtuse marginal distal stenosis. However the lesion appears to be well-healed and does not appear to be unstable at this point. Patient has dropped her hemoglobin this morning, has chronic renal insufficiency, it is a distal vessel although it is 2. 25 - 2.5 mm vessel, hence will be treated  medically unless she has recurrent angina pectoris. She needs aggressive risk modification. Patient also has history of diabetic retinopathy and retinal bleed. Hence I had elected to treat her medical therapy for now.  The following morning CBC revealed hemoglobin back to baseline.  Patient remained asymptomatic without any recurrence of angina pectoris, ambulated in the hallway without any recurrence, discussed regarding risk factor modification and weight loss, felt stable for discharge.   I have started her on clopidogrel 75 mg by mouth daily and also advised her to increase Crestor to 20 mg by mouth daily and to see Dr. Gareth Eagle in one week to 10 days for follow-up of her cardiovascular risk factors and to see whether 20 mg of Crestor be appropriate. Her lipids are well controlled, however I would like to use high-dose statin for at least 4-6 weeks.  She needs to be worked up for anemia.  I do not have complete details of the same.  She did not have any obvious GI bleed.  CBC was stable since admission.  I would like to see her back in the office in 2 weeks.  Discharge Exam: Blood pressure 158/77, pulse 75, temperature 98 F (36.7 C), temperature source Oral, resp. rate 18, height 5' 4.8" (1.646 m), weight 81 kg (178 lb 9.2 oz), SpO2 100.00%.  General: Well built and obese body habitus who is in no acute distress. Appears younger than stated age. Alert Ox3.  There is no cyanosis. HEENT: normal limits.No JVD.  CARDIAC EXAM: S1, S2 normal, no gallop present. No murmur.  CHEST EXAM: No tenderness of chest wall. LUNGS: Clear to percuss and auscultate.  ABDOMEN: No hepatosplenomegaly. BS normal in all 4 quadrants. Abdomen is non-tender.  EXTREMITY: Full range of movementes, No edema. Right radial arterial access site without any complication.  Labs:   Lab Results  Component Value Date   WBC 8.6 04/10/2014   HGB 10.3* 04/10/2014   HCT 32.6* 04/10/2014   MCV 94.8 04/10/2014   PLT 277 04/10/2014    Recent Labs Lab 04/08/14 1555 04/09/14 0028  NA 141 140  K 4.7 4.0  CL 102 105  CO2 22 20  BUN 14 12  CREATININE 1.10 1.13*  CALCIUM 9.9 9.0  PROT 7.3  --   BILITOT 0.3  --   ALKPHOS 80  --   ALT 18   --   AST 18  --   GLUCOSE 267* 236*   Lab Results  Component Value Date   TROPONINI <0.30 04/09/2014    Lipid Panel     Component Value Date/Time   CHOL 94 04/09/2014 0256   TRIG 124 04/09/2014 0256   HDL 33* 04/09/2014 0256   CHOLHDL 2.8 04/09/2014 0256   VLDL 25 04/09/2014 0256   LDLCALC 36 04/09/2014 0256    HbA1c 04/08/2014:  9.0%.  EKG: 04/09/2014: Normal sinus rhythm, poor R-wave progression, no evidence of ischemia.    Radiology: Dg Chest 2 View  04/08/2014   CLINICAL DATA:  Chest pain  EXAM: CHEST  2 VIEW  COMPARISON:  11/12/2013  FINDINGS: Cardiac shadow is within normal limits. The lungs are well aerated bilaterally without focal infiltrate or sizable effusion. No acute bony abnormality is seen.  IMPRESSION: No active cardiopulmonary disease.   Electronically Signed   By: Inez Catalina M.D.   On: 04/08/2014 15:49      FOLLOW UP PLANS AND APPOINTMENTS Discharge Orders   Future Orders Complete By Expires   Amb Referral to Cardiac Rehabilitation  As directed        Medication List    STOP taking these medications       celecoxib 200 MG capsule  Commonly known as:  CELEBREX      TAKE these medications       acetaminophen 325 MG tablet  Commonly known as:  TYLENOL  Take 325 mg by mouth every 6 (six) hours as needed for mild pain or fever.     amLODipine 5 MG tablet  Commonly known as:  NORVASC  Take 5 mg by mouth daily.     AZOR 5-40 MG per tablet  Generic drug:  amLODipine-olmesartan  Take 1 tablet by mouth daily.     clopidogrel 75 MG tablet  Commonly known as:  PLAVIX  Take 1 tablet (75 mg total) by mouth daily with breakfast.     cyclobenzaprine 5 MG tablet  Commonly known as:  FLEXERIL  Take 5 mg by mouth daily as needed. For spasms     EPIPEN 0.3 mg/0.3 mL Soaj injection  Generic drug:  EPINEPHrine  Inject 0.3 mg into the muscle once as needed (for bee stings).     glimepiride 2 MG tablet  Commonly known as:  AMARYL  Take 2 mg by mouth  daily before breakfast.     HYDROcodone-acetaminophen 5-500 MG per tablet  Commonly known as:  VICODIN  Take 1 tablet by mouth every 6 (six) hours as needed. For pain     insulin glargine 100 UNIT/ML injection  Commonly known as:  LANTUS  Inject 20 Units into the skin 2 (two) times daily. Pt's blood sugar is between 80-120 she uses 10 units if blood sugar is > 120 she uses 20 units     lisinopril 10 MG tablet  Commonly known as:  PRINIVIL,ZESTRIL  Take 10 mg by mouth daily.     metFORMIN 1000 MG tablet  Commonly known as:  GLUCOPHAGE  Take 1,000 mg by mouth 2 (two) times daily with a meal.     mirabegron ER 50 MG Tb24 tablet  Commonly known as:  MYRBETRIQ  Take 50 mg by mouth daily.     multivitamin with minerals tablet  Take 1 tablet by mouth daily.     pantoprazole 40 MG tablet  Commonly known as:  PROTONIX  Take 40 mg by mouth daily.     rosuvastatin 5 MG tablet  Commonly known as:  CRESTOR  Take 4 tablets (20 mg total) by mouth daily.     torsemide 20 MG tablet  Commonly known as:  DEMADEX  Take 20 mg by mouth daily.     VICTOZA 18 MG/3ML Soln injection  Generic drug:  Liraglutide  Inject 1.8 mg into the skin daily.           Follow-up Information   Follow up with Dwan Bolt, MD. Call today. (To be seen in 1-2 weeks. Ask about Crestor 20 mg Rx)    Specialty:  Endocrinology   Contact information:   [place] [address] [phone]        [name], MD 04/10/2014, 5:44 PM  Pager: [phone] Office: [phone] If no answer: [phone]

## 2014-04-11 DIAGNOSIS — E1129 Type 2 diabetes mellitus with other diabetic kidney complication: Secondary | ICD-10-CM | POA: Diagnosis not present

## 2014-04-11 DIAGNOSIS — I200 Unstable angina: Secondary | ICD-10-CM | POA: Diagnosis not present

## 2014-04-11 DIAGNOSIS — I2510 Atherosclerotic heart disease of native coronary artery without angina pectoris: Secondary | ICD-10-CM | POA: Diagnosis not present

## 2014-04-14 DIAGNOSIS — E119 Type 2 diabetes mellitus without complications: Secondary | ICD-10-CM | POA: Diagnosis not present

## 2014-04-14 DIAGNOSIS — I10 Essential (primary) hypertension: Secondary | ICD-10-CM | POA: Diagnosis not present

## 2014-04-14 DIAGNOSIS — I2510 Atherosclerotic heart disease of native coronary artery without angina pectoris: Secondary | ICD-10-CM | POA: Diagnosis not present

## 2014-04-14 DIAGNOSIS — E789 Disorder of lipoprotein metabolism, unspecified: Secondary | ICD-10-CM | POA: Diagnosis not present

## 2014-04-16 DIAGNOSIS — E1129 Type 2 diabetes mellitus with other diabetic kidney complication: Secondary | ICD-10-CM | POA: Diagnosis not present

## 2014-04-16 DIAGNOSIS — I209 Angina pectoris, unspecified: Secondary | ICD-10-CM | POA: Diagnosis not present

## 2014-04-16 DIAGNOSIS — I2510 Atherosclerotic heart disease of native coronary artery without angina pectoris: Secondary | ICD-10-CM | POA: Diagnosis not present

## 2014-04-16 DIAGNOSIS — E785 Hyperlipidemia, unspecified: Secondary | ICD-10-CM | POA: Diagnosis not present

## 2014-05-20 DIAGNOSIS — H40059 Ocular hypertension, unspecified eye: Secondary | ICD-10-CM | POA: Diagnosis not present

## 2014-05-20 DIAGNOSIS — H40029 Open angle with borderline findings, high risk, unspecified eye: Secondary | ICD-10-CM | POA: Diagnosis not present

## 2014-06-09 DIAGNOSIS — E119 Type 2 diabetes mellitus without complications: Secondary | ICD-10-CM | POA: Diagnosis not present

## 2014-06-15 DIAGNOSIS — E119 Type 2 diabetes mellitus without complications: Secondary | ICD-10-CM | POA: Diagnosis not present

## 2014-06-15 DIAGNOSIS — I2510 Atherosclerotic heart disease of native coronary artery without angina pectoris: Secondary | ICD-10-CM | POA: Diagnosis not present

## 2014-06-18 DIAGNOSIS — R0602 Shortness of breath: Secondary | ICD-10-CM | POA: Diagnosis not present

## 2014-06-18 DIAGNOSIS — I209 Angina pectoris, unspecified: Secondary | ICD-10-CM | POA: Diagnosis not present

## 2014-06-18 DIAGNOSIS — I2510 Atherosclerotic heart disease of native coronary artery without angina pectoris: Secondary | ICD-10-CM | POA: Diagnosis not present

## 2014-06-18 DIAGNOSIS — IMO0001 Reserved for inherently not codable concepts without codable children: Secondary | ICD-10-CM | POA: Diagnosis not present

## 2014-06-24 DIAGNOSIS — I10 Essential (primary) hypertension: Secondary | ICD-10-CM | POA: Diagnosis not present

## 2014-06-24 DIAGNOSIS — R079 Chest pain, unspecified: Secondary | ICD-10-CM | POA: Diagnosis not present

## 2014-06-24 DIAGNOSIS — E119 Type 2 diabetes mellitus without complications: Secondary | ICD-10-CM | POA: Diagnosis not present

## 2014-07-16 DIAGNOSIS — H40059 Ocular hypertension, unspecified eye: Secondary | ICD-10-CM | POA: Diagnosis not present

## 2014-07-16 DIAGNOSIS — Z961 Presence of intraocular lens: Secondary | ICD-10-CM | POA: Diagnosis not present

## 2014-07-16 DIAGNOSIS — H02409 Unspecified ptosis of unspecified eyelid: Secondary | ICD-10-CM | POA: Diagnosis not present

## 2014-07-16 DIAGNOSIS — H40029 Open angle with borderline findings, high risk, unspecified eye: Secondary | ICD-10-CM | POA: Diagnosis not present

## 2014-09-01 DIAGNOSIS — E119 Type 2 diabetes mellitus without complications: Secondary | ICD-10-CM | POA: Diagnosis not present

## 2014-09-09 DIAGNOSIS — Z2911 Encounter for prophylactic immunotherapy for respiratory syncytial virus (RSV): Secondary | ICD-10-CM | POA: Diagnosis not present

## 2014-09-09 DIAGNOSIS — I2510 Atherosclerotic heart disease of native coronary artery without angina pectoris: Secondary | ICD-10-CM | POA: Diagnosis not present

## 2014-09-09 DIAGNOSIS — E119 Type 2 diabetes mellitus without complications: Secondary | ICD-10-CM | POA: Diagnosis not present

## 2014-09-09 DIAGNOSIS — M25519 Pain in unspecified shoulder: Secondary | ICD-10-CM | POA: Diagnosis not present

## 2014-09-16 DIAGNOSIS — Z23 Encounter for immunization: Secondary | ICD-10-CM | POA: Diagnosis not present

## 2014-09-24 DIAGNOSIS — M25511 Pain in right shoulder: Secondary | ICD-10-CM | POA: Diagnosis not present

## 2014-11-26 ENCOUNTER — Encounter (HOSPITAL_COMMUNITY): Payer: Self-pay | Admitting: Cardiology

## 2014-11-26 DIAGNOSIS — E118 Type 2 diabetes mellitus with unspecified complications: Secondary | ICD-10-CM | POA: Diagnosis not present

## 2014-12-04 DIAGNOSIS — E789 Disorder of lipoprotein metabolism, unspecified: Secondary | ICD-10-CM | POA: Diagnosis not present

## 2014-12-04 DIAGNOSIS — I10 Essential (primary) hypertension: Secondary | ICD-10-CM | POA: Diagnosis not present

## 2014-12-04 DIAGNOSIS — E118 Type 2 diabetes mellitus with unspecified complications: Secondary | ICD-10-CM | POA: Diagnosis not present

## 2014-12-21 DIAGNOSIS — E118 Type 2 diabetes mellitus with unspecified complications: Secondary | ICD-10-CM | POA: Diagnosis not present

## 2014-12-23 DIAGNOSIS — I25119 Atherosclerotic heart disease of native coronary artery with unspecified angina pectoris: Secondary | ICD-10-CM | POA: Diagnosis not present

## 2014-12-23 DIAGNOSIS — E1122 Type 2 diabetes mellitus with diabetic chronic kidney disease: Secondary | ICD-10-CM | POA: Diagnosis not present

## 2014-12-23 DIAGNOSIS — I10 Essential (primary) hypertension: Secondary | ICD-10-CM | POA: Diagnosis not present

## 2014-12-23 DIAGNOSIS — E780 Pure hypercholesterolemia: Secondary | ICD-10-CM | POA: Diagnosis not present

## 2014-12-28 DIAGNOSIS — E118 Type 2 diabetes mellitus with unspecified complications: Secondary | ICD-10-CM | POA: Diagnosis not present

## 2014-12-28 DIAGNOSIS — I10 Essential (primary) hypertension: Secondary | ICD-10-CM | POA: Diagnosis not present

## 2014-12-28 DIAGNOSIS — E789 Disorder of lipoprotein metabolism, unspecified: Secondary | ICD-10-CM | POA: Diagnosis not present

## 2015-01-18 DIAGNOSIS — R002 Palpitations: Secondary | ICD-10-CM | POA: Diagnosis not present

## 2015-01-18 DIAGNOSIS — M549 Dorsalgia, unspecified: Secondary | ICD-10-CM | POA: Diagnosis not present

## 2015-01-21 DIAGNOSIS — Z961 Presence of intraocular lens: Secondary | ICD-10-CM | POA: Diagnosis not present

## 2015-01-21 DIAGNOSIS — H47293 Other optic atrophy, bilateral: Secondary | ICD-10-CM | POA: Diagnosis not present

## 2015-01-21 DIAGNOSIS — E11319 Type 2 diabetes mellitus with unspecified diabetic retinopathy without macular edema: Secondary | ICD-10-CM | POA: Diagnosis not present

## 2015-01-21 DIAGNOSIS — H4011X1 Primary open-angle glaucoma, mild stage: Secondary | ICD-10-CM | POA: Diagnosis not present

## 2015-02-04 DIAGNOSIS — E1122 Type 2 diabetes mellitus with diabetic chronic kidney disease: Secondary | ICD-10-CM | POA: Diagnosis not present

## 2015-02-04 DIAGNOSIS — E780 Pure hypercholesterolemia: Secondary | ICD-10-CM | POA: Diagnosis not present

## 2015-02-04 DIAGNOSIS — I25119 Atherosclerotic heart disease of native coronary artery with unspecified angina pectoris: Secondary | ICD-10-CM | POA: Diagnosis not present

## 2015-02-04 DIAGNOSIS — N183 Chronic kidney disease, stage 3 (moderate): Secondary | ICD-10-CM | POA: Diagnosis not present

## 2015-02-10 DIAGNOSIS — Z1231 Encounter for screening mammogram for malignant neoplasm of breast: Secondary | ICD-10-CM | POA: Diagnosis not present

## 2015-02-24 DIAGNOSIS — I10 Essential (primary) hypertension: Secondary | ICD-10-CM | POA: Diagnosis not present

## 2015-02-24 DIAGNOSIS — E780 Pure hypercholesterolemia: Secondary | ICD-10-CM | POA: Diagnosis not present

## 2015-02-24 DIAGNOSIS — E118 Type 2 diabetes mellitus with unspecified complications: Secondary | ICD-10-CM | POA: Diagnosis not present

## 2015-03-05 DIAGNOSIS — N3281 Overactive bladder: Secondary | ICD-10-CM | POA: Diagnosis not present

## 2015-03-05 DIAGNOSIS — M549 Dorsalgia, unspecified: Secondary | ICD-10-CM | POA: Diagnosis not present

## 2015-03-05 DIAGNOSIS — E789 Disorder of lipoprotein metabolism, unspecified: Secondary | ICD-10-CM | POA: Diagnosis not present

## 2015-03-05 DIAGNOSIS — E118 Type 2 diabetes mellitus with unspecified complications: Secondary | ICD-10-CM | POA: Diagnosis not present

## 2015-03-05 DIAGNOSIS — I10 Essential (primary) hypertension: Secondary | ICD-10-CM | POA: Diagnosis not present

## 2015-03-05 DIAGNOSIS — M546 Pain in thoracic spine: Secondary | ICD-10-CM | POA: Diagnosis not present

## 2015-06-02 DIAGNOSIS — N3281 Overactive bladder: Secondary | ICD-10-CM | POA: Diagnosis not present

## 2015-06-02 DIAGNOSIS — I10 Essential (primary) hypertension: Secondary | ICD-10-CM | POA: Diagnosis not present

## 2015-06-02 DIAGNOSIS — E118 Type 2 diabetes mellitus with unspecified complications: Secondary | ICD-10-CM | POA: Diagnosis not present

## 2015-06-09 DIAGNOSIS — E118 Type 2 diabetes mellitus with unspecified complications: Secondary | ICD-10-CM | POA: Diagnosis not present

## 2015-06-09 DIAGNOSIS — I10 Essential (primary) hypertension: Secondary | ICD-10-CM | POA: Diagnosis not present

## 2015-06-09 DIAGNOSIS — E789 Disorder of lipoprotein metabolism, unspecified: Secondary | ICD-10-CM | POA: Diagnosis not present

## 2015-08-13 DIAGNOSIS — I2510 Atherosclerotic heart disease of native coronary artery without angina pectoris: Secondary | ICD-10-CM | POA: Diagnosis not present

## 2015-08-13 DIAGNOSIS — E1122 Type 2 diabetes mellitus with diabetic chronic kidney disease: Secondary | ICD-10-CM | POA: Diagnosis not present

## 2015-08-13 DIAGNOSIS — E780 Pure hypercholesterolemia: Secondary | ICD-10-CM | POA: Diagnosis not present

## 2015-08-13 DIAGNOSIS — N183 Chronic kidney disease, stage 3 (moderate): Secondary | ICD-10-CM | POA: Diagnosis not present

## 2015-09-02 DIAGNOSIS — E118 Type 2 diabetes mellitus with unspecified complications: Secondary | ICD-10-CM | POA: Diagnosis not present

## 2015-09-09 DIAGNOSIS — R002 Palpitations: Secondary | ICD-10-CM | POA: Diagnosis not present

## 2015-09-09 DIAGNOSIS — E118 Type 2 diabetes mellitus with unspecified complications: Secondary | ICD-10-CM | POA: Diagnosis not present

## 2015-09-09 DIAGNOSIS — E789 Disorder of lipoprotein metabolism, unspecified: Secondary | ICD-10-CM | POA: Diagnosis not present

## 2015-09-15 DIAGNOSIS — Z23 Encounter for immunization: Secondary | ICD-10-CM | POA: Diagnosis not present

## 2015-09-17 DIAGNOSIS — H6120 Impacted cerumen, unspecified ear: Secondary | ICD-10-CM | POA: Diagnosis not present

## 2016-01-21 DIAGNOSIS — E118 Type 2 diabetes mellitus with unspecified complications: Secondary | ICD-10-CM | POA: Diagnosis not present

## 2016-01-21 DIAGNOSIS — I10 Essential (primary) hypertension: Secondary | ICD-10-CM | POA: Diagnosis not present

## 2016-01-21 DIAGNOSIS — N3281 Overactive bladder: Secondary | ICD-10-CM | POA: Diagnosis not present

## 2016-01-21 DIAGNOSIS — E789 Disorder of lipoprotein metabolism, unspecified: Secondary | ICD-10-CM | POA: Diagnosis not present

## 2016-02-29 DIAGNOSIS — Z0000 Encounter for general adult medical examination without abnormal findings: Secondary | ICD-10-CM | POA: Diagnosis not present

## 2016-02-29 DIAGNOSIS — E118 Type 2 diabetes mellitus with unspecified complications: Secondary | ICD-10-CM | POA: Diagnosis not present

## 2016-02-29 DIAGNOSIS — I10 Essential (primary) hypertension: Secondary | ICD-10-CM | POA: Diagnosis not present

## 2016-02-29 DIAGNOSIS — E789 Disorder of lipoprotein metabolism, unspecified: Secondary | ICD-10-CM | POA: Diagnosis not present

## 2016-02-29 DIAGNOSIS — N390 Urinary tract infection, site not specified: Secondary | ICD-10-CM | POA: Diagnosis not present

## 2016-03-07 ENCOUNTER — Other Ambulatory Visit: Payer: Self-pay | Admitting: Endocrinology

## 2016-03-07 DIAGNOSIS — E789 Disorder of lipoprotein metabolism, unspecified: Secondary | ICD-10-CM | POA: Diagnosis not present

## 2016-03-07 DIAGNOSIS — R194 Change in bowel habit: Secondary | ICD-10-CM

## 2016-03-07 DIAGNOSIS — E118 Type 2 diabetes mellitus with unspecified complications: Secondary | ICD-10-CM | POA: Diagnosis not present

## 2016-03-07 DIAGNOSIS — N390 Urinary tract infection, site not specified: Secondary | ICD-10-CM | POA: Diagnosis not present

## 2016-03-07 DIAGNOSIS — D649 Anemia, unspecified: Secondary | ICD-10-CM

## 2016-03-07 DIAGNOSIS — R32 Unspecified urinary incontinence: Secondary | ICD-10-CM

## 2016-03-07 DIAGNOSIS — I10 Essential (primary) hypertension: Secondary | ICD-10-CM | POA: Diagnosis not present

## 2016-03-09 DIAGNOSIS — H6120 Impacted cerumen, unspecified ear: Secondary | ICD-10-CM | POA: Diagnosis not present

## 2016-03-14 ENCOUNTER — Ambulatory Visit
Admission: RE | Admit: 2016-03-14 | Discharge: 2016-03-14 | Disposition: A | Discharge status: Home / Self Care | Payer: Medicare Other | Source: Ambulatory Visit | Attending: Endocrinology | Admitting: Endocrinology

## 2016-03-14 DIAGNOSIS — R194 Change in bowel habit: Secondary | ICD-10-CM

## 2016-03-14 DIAGNOSIS — R32 Unspecified urinary incontinence: Secondary | ICD-10-CM

## 2016-03-14 DIAGNOSIS — K5730 Diverticulosis of large intestine without perforation or abscess without bleeding: Secondary | ICD-10-CM | POA: Diagnosis not present

## 2016-03-14 DIAGNOSIS — D649 Anemia, unspecified: Secondary | ICD-10-CM

## 2016-03-17 DIAGNOSIS — Z1231 Encounter for screening mammogram for malignant neoplasm of breast: Secondary | ICD-10-CM | POA: Diagnosis not present

## 2016-03-21 DIAGNOSIS — R197 Diarrhea, unspecified: Secondary | ICD-10-CM | POA: Diagnosis not present

## 2016-04-21 DIAGNOSIS — H26491 Other secondary cataract, right eye: Secondary | ICD-10-CM | POA: Diagnosis not present

## 2016-04-21 DIAGNOSIS — E119 Type 2 diabetes mellitus without complications: Secondary | ICD-10-CM | POA: Diagnosis not present

## 2016-04-21 DIAGNOSIS — Z961 Presence of intraocular lens: Secondary | ICD-10-CM | POA: Diagnosis not present

## 2016-04-21 DIAGNOSIS — H401131 Primary open-angle glaucoma, bilateral, mild stage: Secondary | ICD-10-CM | POA: Diagnosis not present

## 2016-04-27 DIAGNOSIS — E118 Type 2 diabetes mellitus with unspecified complications: Secondary | ICD-10-CM | POA: Diagnosis not present

## 2016-04-27 DIAGNOSIS — M47816 Spondylosis without myelopathy or radiculopathy, lumbar region: Secondary | ICD-10-CM | POA: Diagnosis not present

## 2016-04-27 DIAGNOSIS — I10 Essential (primary) hypertension: Secondary | ICD-10-CM | POA: Diagnosis not present

## 2016-04-27 DIAGNOSIS — N390 Urinary tract infection, site not specified: Secondary | ICD-10-CM | POA: Diagnosis not present

## 2016-04-27 DIAGNOSIS — M549 Dorsalgia, unspecified: Secondary | ICD-10-CM | POA: Diagnosis not present

## 2016-04-28 DIAGNOSIS — N390 Urinary tract infection, site not specified: Secondary | ICD-10-CM | POA: Diagnosis not present

## 2016-04-28 DIAGNOSIS — I10 Essential (primary) hypertension: Secondary | ICD-10-CM | POA: Diagnosis not present

## 2016-05-26 DIAGNOSIS — N390 Urinary tract infection, site not specified: Secondary | ICD-10-CM | POA: Diagnosis not present

## 2016-06-01 DIAGNOSIS — W57XXXA Bitten or stung by nonvenomous insect and other nonvenomous arthropods, initial encounter: Secondary | ICD-10-CM | POA: Diagnosis not present

## 2016-06-01 DIAGNOSIS — I10 Essential (primary) hypertension: Secondary | ICD-10-CM | POA: Diagnosis not present

## 2016-06-01 DIAGNOSIS — R197 Diarrhea, unspecified: Secondary | ICD-10-CM | POA: Diagnosis not present

## 2016-06-06 DIAGNOSIS — M549 Dorsalgia, unspecified: Secondary | ICD-10-CM | POA: Diagnosis not present

## 2016-06-21 DIAGNOSIS — M545 Low back pain: Secondary | ICD-10-CM | POA: Diagnosis not present

## 2016-06-23 DIAGNOSIS — H1131 Conjunctival hemorrhage, right eye: Secondary | ICD-10-CM | POA: Diagnosis not present

## 2016-06-29 DIAGNOSIS — K2270 Barrett's esophagus without dysplasia: Secondary | ICD-10-CM | POA: Diagnosis not present

## 2016-06-29 DIAGNOSIS — R159 Full incontinence of feces: Secondary | ICD-10-CM | POA: Diagnosis not present

## 2016-06-29 DIAGNOSIS — A09 Infectious gastroenteritis and colitis, unspecified: Secondary | ICD-10-CM | POA: Diagnosis not present

## 2016-06-29 DIAGNOSIS — K5730 Diverticulosis of large intestine without perforation or abscess without bleeding: Secondary | ICD-10-CM | POA: Diagnosis not present

## 2016-07-03 DIAGNOSIS — M545 Low back pain: Secondary | ICD-10-CM | POA: Diagnosis not present

## 2016-07-05 DIAGNOSIS — M545 Low back pain: Secondary | ICD-10-CM | POA: Diagnosis not present

## 2016-07-12 DIAGNOSIS — M545 Low back pain: Secondary | ICD-10-CM | POA: Diagnosis not present

## 2016-07-14 DIAGNOSIS — M545 Low back pain: Secondary | ICD-10-CM | POA: Diagnosis not present

## 2016-07-19 DIAGNOSIS — M545 Low back pain: Secondary | ICD-10-CM | POA: Diagnosis not present

## 2016-07-21 DIAGNOSIS — M545 Low back pain: Secondary | ICD-10-CM | POA: Diagnosis not present

## 2016-07-26 DIAGNOSIS — M545 Low back pain: Secondary | ICD-10-CM | POA: Diagnosis not present

## 2016-07-28 DIAGNOSIS — M545 Low back pain: Secondary | ICD-10-CM | POA: Diagnosis not present

## 2016-07-31 DIAGNOSIS — M545 Low back pain: Secondary | ICD-10-CM | POA: Diagnosis not present

## 2016-08-02 DIAGNOSIS — M545 Low back pain: Secondary | ICD-10-CM | POA: Diagnosis not present

## 2016-08-03 DIAGNOSIS — E118 Type 2 diabetes mellitus with unspecified complications: Secondary | ICD-10-CM | POA: Diagnosis not present

## 2016-08-03 DIAGNOSIS — K2270 Barrett's esophagus without dysplasia: Secondary | ICD-10-CM | POA: Diagnosis not present

## 2016-08-03 DIAGNOSIS — A09 Infectious gastroenteritis and colitis, unspecified: Secondary | ICD-10-CM | POA: Diagnosis not present

## 2016-08-03 DIAGNOSIS — R197 Diarrhea, unspecified: Secondary | ICD-10-CM | POA: Diagnosis not present

## 2016-08-03 DIAGNOSIS — M549 Dorsalgia, unspecified: Secondary | ICD-10-CM | POA: Diagnosis not present

## 2016-08-07 DIAGNOSIS — M545 Low back pain: Secondary | ICD-10-CM | POA: Diagnosis not present

## 2016-08-15 DIAGNOSIS — K449 Diaphragmatic hernia without obstruction or gangrene: Secondary | ICD-10-CM | POA: Diagnosis not present

## 2016-08-15 DIAGNOSIS — K2950 Unspecified chronic gastritis without bleeding: Secondary | ICD-10-CM | POA: Diagnosis not present

## 2016-08-22 DIAGNOSIS — R197 Diarrhea, unspecified: Secondary | ICD-10-CM | POA: Diagnosis not present

## 2016-10-02 DIAGNOSIS — M545 Low back pain: Secondary | ICD-10-CM | POA: Diagnosis not present

## 2016-10-04 DIAGNOSIS — L299 Pruritus, unspecified: Secondary | ICD-10-CM | POA: Diagnosis not present

## 2016-10-04 DIAGNOSIS — E118 Type 2 diabetes mellitus with unspecified complications: Secondary | ICD-10-CM | POA: Diagnosis not present

## 2016-10-04 DIAGNOSIS — R197 Diarrhea, unspecified: Secondary | ICD-10-CM | POA: Diagnosis not present

## 2016-10-18 DIAGNOSIS — I10 Essential (primary) hypertension: Secondary | ICD-10-CM | POA: Diagnosis not present

## 2016-10-18 DIAGNOSIS — R05 Cough: Secondary | ICD-10-CM | POA: Diagnosis not present

## 2016-10-20 DIAGNOSIS — L853 Xerosis cutis: Secondary | ICD-10-CM | POA: Diagnosis not present

## 2016-10-20 DIAGNOSIS — L304 Erythema intertrigo: Secondary | ICD-10-CM | POA: Diagnosis not present

## 2016-10-20 DIAGNOSIS — L821 Other seborrheic keratosis: Secondary | ICD-10-CM | POA: Diagnosis not present

## 2016-10-27 DIAGNOSIS — H401131 Primary open-angle glaucoma, bilateral, mild stage: Secondary | ICD-10-CM | POA: Diagnosis not present

## 2016-10-27 DIAGNOSIS — H04123 Dry eye syndrome of bilateral lacrimal glands: Secondary | ICD-10-CM | POA: Diagnosis not present

## 2016-11-14 DIAGNOSIS — E789 Disorder of lipoprotein metabolism, unspecified: Secondary | ICD-10-CM | POA: Diagnosis not present

## 2016-11-14 DIAGNOSIS — E118 Type 2 diabetes mellitus with unspecified complications: Secondary | ICD-10-CM | POA: Diagnosis not present

## 2016-11-14 DIAGNOSIS — I10 Essential (primary) hypertension: Secondary | ICD-10-CM | POA: Diagnosis not present

## 2016-11-21 DIAGNOSIS — I10 Essential (primary) hypertension: Secondary | ICD-10-CM | POA: Diagnosis not present

## 2016-11-21 DIAGNOSIS — Z23 Encounter for immunization: Secondary | ICD-10-CM | POA: Diagnosis not present

## 2016-11-21 DIAGNOSIS — E118 Type 2 diabetes mellitus with unspecified complications: Secondary | ICD-10-CM | POA: Diagnosis not present

## 2016-11-21 DIAGNOSIS — L309 Dermatitis, unspecified: Secondary | ICD-10-CM | POA: Diagnosis not present

## 2016-12-20 DIAGNOSIS — R351 Nocturia: Secondary | ICD-10-CM | POA: Diagnosis not present

## 2016-12-20 DIAGNOSIS — N3946 Mixed incontinence: Secondary | ICD-10-CM | POA: Diagnosis not present

## 2016-12-20 DIAGNOSIS — I10 Essential (primary) hypertension: Secondary | ICD-10-CM | POA: Diagnosis not present

## 2016-12-20 DIAGNOSIS — R350 Frequency of micturition: Secondary | ICD-10-CM | POA: Diagnosis not present

## 2016-12-20 DIAGNOSIS — E118 Type 2 diabetes mellitus with unspecified complications: Secondary | ICD-10-CM | POA: Diagnosis not present

## 2016-12-27 DIAGNOSIS — N390 Urinary tract infection, site not specified: Secondary | ICD-10-CM | POA: Diagnosis not present

## 2016-12-27 DIAGNOSIS — E118 Type 2 diabetes mellitus with unspecified complications: Secondary | ICD-10-CM | POA: Diagnosis not present

## 2016-12-29 DIAGNOSIS — E118 Type 2 diabetes mellitus with unspecified complications: Secondary | ICD-10-CM | POA: Diagnosis not present

## 2016-12-29 DIAGNOSIS — I10 Essential (primary) hypertension: Secondary | ICD-10-CM | POA: Diagnosis not present

## 2017-01-02 ENCOUNTER — Other Ambulatory Visit: Payer: Self-pay

## 2017-01-02 NOTE — Patient Outreach (Addendum)
[place] [facility]) Care Management  01/02/2017  Mary Harding 1934/11/18 [id number]     Telephone Screen  Referral Date: 12/29/16 Referral Source: Aging Grace[employer] Program Referral Reason: "DM, blood sugars not controlled,pharmacy for education/reinforcement of medications, >20 meds" Insurance: Medicare   Outreach attempt # 1 to patient. Spoke with patient and screening completed.   Social: Patient resides in her home alone. She remains independent with ADLs/IADLs. She drives herself to MD appts. She reports multiple falls within the past year, "at least 5 or 6." She uses a cane as needed. Patient currently enrolled in the [employer]ging [facility] program. She states that they are doing several home repairs/improvements to her home to make it safer for her to maintain her independence in the home.  Conditions: She has PMH of HTN,uncontrolled DM, CKD stage 3, diabetic retinopathy, chronic degenerative joint disease. patient states that her blood sugars are "out of control." She reports that she is checking them about 3-5x/day as a result. She has not checked this morning as she is just getting up. She states cbgs last week was 466.  She voices that MD is aware of her elevated cbgs. She reports cbg of 119 fasting on yesterday. Patient states she can not tell when her cbgs are elevated. She does report being able to notice the signs of hypoglycemia. Last A1C was 10.3(Nov 2017). She states that she is trying to watch what she eats and has recently started going to the mall several times a week to walk and exercise. Patient reports she is currently taking an antibiotic for "kidney leakage problem" and thinks that this may be contributing to her elevated cbgs.   Medications: Patient taking 20 meds. She is managing her meds independently and fills her own med planner weekly. Denies any issues at present with affordability.   Appointments: She saw Dr. Anda Kraft on last week.   Advance  Directives: None. Patient states she has been talking with her children about completing forms and would like further information and assistance with completing documents. Agreeable to SW referral.   Consent: [facility] services reviewed and discussed. Patient gave verbal consent for [facility] services.    Plan: RN CM will notify [facility] administrative assistant of case status. RN CM will send [facility] community referral for further in home eval/assessment of care needs and assistance with managing chronic conditions. RN CM will send [facility] SW referral for assistance with advance directives. RN CM will send [facility] referral for polypharmacy med review and med management. RN CM provided patient with [facility] 24 hr Nurse Line contact info.   Enzo Montgomery, RN,BSN,CCM [place] [facility] Telephonic Care Management Coordinator Direct Phone: [phone] Toll Free: [phone] Fax: [phone]

## 2017-01-03 NOTE — Addendum Note (Signed)
Addended by: Tobi Bastos on: 01/03/2017 05:49 PM   Modules accepted: Miquel Dunn

## 2017-01-03 NOTE — Telephone Encounter (Signed)
This encounter was created in error - please disregard.

## 2017-01-04 ENCOUNTER — Other Ambulatory Visit: Payer: Self-pay | Admitting: *Deleted

## 2017-01-04 NOTE — Patient Outreach (Addendum)
[place] [facility]) Care Management  01/04/2017  Mary Harding 06-13-1934 [id number]   Patient referred by[facility]  telephonic nurse Clearnce Hasten to assist patient with her Advanced Directive.  Patient contacted, home visit scheduled for 01/09/17 at 10:00am.   [place], [place] Management [phone]

## 2017-01-05 ENCOUNTER — Ambulatory Visit: Payer: Self-pay

## 2017-01-09 ENCOUNTER — Other Ambulatory Visit: Payer: Self-pay | Admitting: *Deleted

## 2017-01-09 ENCOUNTER — Encounter: Payer: Self-pay | Admitting: *Deleted

## 2017-01-09 NOTE — Patient Outreach (Signed)
  [place] [facility]) Care Management  [facility] Social Work  01/09/2017  Mary Harding 1934-07-17 [id number]  Subjective:  Patient is a 83 year old African [place] Female. Patient lives alone, however reports having a supportive family.  Patient does not have a Advanced Directive and has not appointed a HCPOA.  Patient reports being interested in completing this document.  Objective:   Encounter Medications:  Outpatient Encounter Prescriptions as of 01/09/2017  Medication Sig  . acetaminophen (TYLENOL) 325 MG tablet Take 325 mg by mouth every 6 (six) hours as needed for mild pain or fever.  Marland Kitchen amLODipine (NORVASC) 5 MG tablet Take 5 mg by mouth daily.  Marland Kitchen amLODipine-olmesartan (AZOR) 5-40 MG per tablet Take 1 tablet by mouth daily.  . clopidogrel (PLAVIX) 75 MG tablet Take 1 tablet (75 mg total) by mouth daily with breakfast.  . cyclobenzaprine (FLEXERIL) 5 MG tablet Take 5 mg by mouth daily as needed. For spasms  . EPINEPHrine (EPIPEN) 0.3 mg/0.3 mL SOAJ injection Inject 0.3 mg into the muscle once as needed (for bee stings).  Marland Kitchen glimepiride (AMARYL) 2 MG tablet Take 2 mg by mouth daily before breakfast.  . HYDROcodone-acetaminophen (VICODIN) 5-500 MG per tablet Take 1 tablet by mouth every 6 (six) hours as needed. For pain  . insulin glargine (LANTUS) 100 UNIT/ML injection Inject 20 Units into the skin 2 (two) times daily. Pt's blood sugar is between 80-120 she uses 10 units if blood sugar is > 120 she uses 20 units  . Liraglutide (VICTOZA) 18 MG/3ML SOLN Inject 1.8 mg into the skin daily.   Marland Kitchen lisinopril (PRINIVIL,ZESTRIL) 10 MG tablet Take 10 mg by mouth daily.  . metFORMIN (GLUCOPHAGE) 1000 MG tablet Take 1,000 mg by mouth 2 (two) times daily with a meal.  . mirabegron ER (MYRBETRIQ) 50 MG TB24 tablet Take 50 mg by mouth daily.  . Multiple Vitamins-Minerals (MULTIVITAMIN WITH MINERALS) tablet Take 1 tablet by mouth daily.  . pantoprazole (PROTONIX) 40 MG tablet Take 40 mg by mouth  daily.  . rosuvastatin (CRESTOR) 5 MG tablet Take 4 tablets (20 mg total) by mouth daily.  Marland Kitchen torsemide (DEMADEX) 20 MG tablet Take 20 mg by mouth daily.   No facility-administered encounter medications on file as of 01/09/2017.     Functional Status:  In your present state of health, do you have any difficulty performing the following activities: 01/09/2017  Hearing? N  Vision? N  Difficulty concentrating or making decisions? N  Walking or climbing stairs? N  Dressing or bathing? N  Doing errands, shopping? N  Preparing Food and eating ? N  Using the Toilet? N  In the past six months, have you accidently leaked urine? Y  Do you have problems with loss of bowel control? Y  Managing your Medications? N  Managing your Finances? N  Housekeeping or managing your Housekeeping? Y  Some recent data might be hidden    Fall/Depression Screening:  PHQ 2/9 Scores 01/09/2017 01/02/2017  PHQ - 2 Score 0 0    Assessment:  Patient friendly and engaging.  [facility] care management discussed. Consent signed.  Purpose of the Advanced Directive discussed as well as benefits of having a HCPOA. Patient given the document to be reviewed for next home visit scheduled for 01/15/17.   Plan:  Home visit scheduled for 01/15/17 to complete advanced Directive and to appoint a Health Care Power of attorney.  Sheralyn Boatman [facility] Care Management [phone]

## 2017-01-10 DIAGNOSIS — E789 Disorder of lipoprotein metabolism, unspecified: Secondary | ICD-10-CM | POA: Diagnosis not present

## 2017-01-10 DIAGNOSIS — E118 Type 2 diabetes mellitus with unspecified complications: Secondary | ICD-10-CM | POA: Diagnosis not present

## 2017-01-10 DIAGNOSIS — I10 Essential (primary) hypertension: Secondary | ICD-10-CM | POA: Diagnosis not present

## 2017-01-12 ENCOUNTER — Other Ambulatory Visit: Payer: Self-pay | Admitting: Pharmacist

## 2017-01-12 ENCOUNTER — Other Ambulatory Visit: Payer: Self-pay

## 2017-01-12 NOTE — Patient Outreach (Signed)
[place] [facility]) Care Management  01/12/2017  Mary Harding [birth year] [id number]  Patient referred to [place] for medication review by [place].  Successful phone outreach to patient.  Patient states she is interested in speaking with [facility] Pharmacist but it is a bad time and she will be out this afternoon.  Offered call back next week, patient agreed and prefers afternoon.   Plan:  Will make outreach attempt to patient next week.   Karrie Meres, PharmD, [place] [phone]

## 2017-01-12 NOTE — Patient Outreach (Signed)
    Telephone contact with patient to schedule home visit, and at that time collaborate to formulate patient's community care plan.  Plan: Home visit within the next 21 days

## 2017-01-15 ENCOUNTER — Other Ambulatory Visit: Payer: Self-pay | Admitting: Pharmacist

## 2017-01-15 ENCOUNTER — Encounter: Payer: Self-pay | Admitting: *Deleted

## 2017-01-15 NOTE — Patient Outreach (Signed)
[place] [facility]) Care Management  01/15/2017  Mary Harding [birth year] [id number]  Successful phone outreach to patient, HIPAA details verified.  [facility] CM Pharmacist offered to review medications with patient over phone or in person, patient reports she prefers in person medication review.   Patient agreeable to a [facility] Pharmacist home visit when [place] has home visit scheduled.    Plan:  Home visit scheduled with patient to review medications.    Karrie Meres, PharmD, [place] [phone]

## 2017-01-16 ENCOUNTER — Other Ambulatory Visit: Payer: Self-pay | Admitting: *Deleted

## 2017-01-16 NOTE — Patient Outreach (Signed)
[place] [facility]) Care Management  01/16/2017  ELNORE COSENS 1934/02/13 [id number]   Phone call to patient to re-scheduled today's appointment. Per patient, she had difficulty sleeping last night and has a doctor's appointment with her provider's office on 01/17/17. This Education officer, museum will call patient next week to re-schedule appointment to completed her Advanced Directive.   Sheralyn Boatman [facility] Care Management [phone]

## 2017-01-17 DIAGNOSIS — E118 Type 2 diabetes mellitus with unspecified complications: Secondary | ICD-10-CM | POA: Diagnosis not present

## 2017-01-18 ENCOUNTER — Other Ambulatory Visit: Payer: Self-pay | Admitting: Pharmacist

## 2017-01-18 ENCOUNTER — Other Ambulatory Visit: Payer: Self-pay

## 2017-01-18 DIAGNOSIS — E785 Hyperlipidemia, unspecified: Secondary | ICD-10-CM

## 2017-01-18 DIAGNOSIS — N390 Urinary tract infection, site not specified: Secondary | ICD-10-CM | POA: Insufficient documentation

## 2017-01-18 DIAGNOSIS — Z23 Encounter for immunization: Secondary | ICD-10-CM

## 2017-01-18 NOTE — Patient Outreach (Signed)
[place] [facility]) Care Management  [place]   01/18/2017  Mary Harding 18-Nov-1934 [id number]  Subjective:  Patient was referred to [place] by [facility] RN Telephonic, Kandra Nicolas for medication review due to being on greater than 15 medications.   Patient has a past medical history significant for:  Type 2 diabetes mellitus, CAD, hyperlipidemia.     Home visit completed in conjunction with [place] and patient's medications were reviewed   Patient reports she uses a weekly pill box which she reports she fills.  She states she obtains samples of Victoza, Myrbetriq, and Viberzi from her prescribers.    Patient denies questions or concerns about her prescriptions at this time.    She provided [facility] Pharmacist her medication list from her PCP.    Objective:   Encounter Medications: Outpatient Encounter Prescriptions as of 01/18/2017  Medication Sig  . acetaminophen (TYLENOL) 325 MG tablet Take 325 mg by mouth every 6 (six) hours as needed for mild pain or fever.  Marland Kitchen amLODipine (NORVASC) 5 MG tablet Take 5 mg by mouth daily.  Marland Kitchen amLODipine-olmesartan (AZOR) 5-40 MG per tablet Take 1 tablet by mouth daily.  . clopidogrel (PLAVIX) 75 MG tablet Take 1 tablet (75 mg total) by mouth daily with breakfast.  . cyclobenzaprine (FLEXERIL) 5 MG tablet Take 5 mg by mouth daily as needed. For spasms  . EPINEPHrine (EPIPEN) 0.3 mg/0.3 mL SOAJ injection Inject 0.3 mg into the muscle once as needed (for bee stings).  Marland Kitchen glimepiride (AMARYL) 2 MG tablet Take 2 mg by mouth daily before breakfast.  . HYDROcodone-acetaminophen (VICODIN) 5-500 MG per tablet Take 1 tablet by mouth every 6 (six) hours as needed. For pain  . insulin glargine (LANTUS) 100 UNIT/ML injection Inject 20 Units into the skin 2 (two) times daily. Pt's blood sugar is between 80-120 she uses 10 units if blood sugar is > 120 she uses 20 units  . Liraglutide (VICTOZA) 18 MG/3ML SOLN Inject 1.8 mg into the skin  daily.   Marland Kitchen lisinopril (PRINIVIL,ZESTRIL) 10 MG tablet Take 10 mg by mouth daily.  . metFORMIN (GLUCOPHAGE) 1000 MG tablet Take 1,000 mg by mouth 2 (two) times daily with a meal.  . mirabegron ER (MYRBETRIQ) 50 MG TB24 tablet Take 50 mg by mouth daily.  . Multiple Vitamins-Minerals (MULTIVITAMIN WITH MINERALS) tablet Take 1 tablet by mouth daily.  . pantoprazole (PROTONIX) 40 MG tablet Take 40 mg by mouth daily.  . rosuvastatin (CRESTOR) 5 MG tablet Take 4 tablets (20 mg total) by mouth daily.  Marland Kitchen torsemide (DEMADEX) 20 MG tablet Take 20 mg by mouth daily.   No facility-administered encounter medications on file as of 01/18/2017.     Functional Status: In your present state of health, do you have any difficulty performing the following activities: 01/18/2017 01/09/2017  Hearing? N N  Vision? Y N  Difficulty concentrating or making decisions? N N  Walking or climbing stairs? Y N  Dressing or bathing? N N  Doing errands, shopping? N N  Preparing Food and eating ? N N  Using the Toilet? N N  In the past six months, have you accidently leaked urine? Y Y  Do you have problems with loss of bowel control? Y Y  Managing your Medications? N N  Managing your Finances? N N  Housekeeping or managing your Housekeeping? [name]  Some recent data might be hidden    Fall/Depression Screening: PHQ 2/9 Scores 01/18/2017 01/09/2017 01/02/2017  PHQ - 2 Score 0 0 0    Assessment:  Medications review by comparing list in this chart (it was updated as necessary), medication list patient provided from her PCP and her prescription bottles.   Drugs sorted by system:  Cardiovascular: -amlodipine -clopidogrel -rosuvastatin  -sublingual nitroglycerin prn -furosemide -valsartan -carvedilol  Pulmonary/Allergy: -epinephrine (EpiPen) prn  Gastrointestinal: -pantoprazole  -Viberzi   Endocrine: -glimepiride  -insulin glargine (Lantus)  -liraglutide (Victoza) ---patient counseled victoza can cause nausea/GI  upset and denies issues at this time.    Pain: -acetaminophen prn -cyclobenzaprine prn -hydrocodone/acetaminophen prn -celecoxib   Vitamins/Minerals: -multivitamin  -magnesium oxide   Miscellaneous: -mirabegron (Myrbetriq)   Medications to avoid in the elderly:  Cyclobenzaprine---increased risk of falls Glimepiride---increased risk of hypoglycemia in patient's with reduced renal function  Other issues: -Patient's medication list from PCP states she is taking Jardiance---patient reports not taking this medication---please confirm if patient is to take Jardiance.     Patient reports she uses a heating pad at night---she was counseled to not sleep with heating pad on due to risk of burning one self.   Medication cost concerns: Discussed with patient, appears she has Full Extra Help with Part D medications.    Potential cost savings exist if patient were to obtain a 90 day supply of maintenance medication vs 30 day supply.  Due to Extra Help status, she is likely not eligible for manufacturer patient assistance programs.     Plan:  Will route this initial medication review note to PCP.   Will make outreach call to patient in the next 2 weeks.   Patient counseled to take medications as prescribed by her prescribers.   Karrie Meres, PharmD, [place] [phone]

## 2017-01-19 NOTE — Patient Outreach (Signed)
[facility]) Care Management   01/18/2017  Mary Harding 1933-12-24 [id number]  Mary Harding is an 83 y.o. female  Subjective:  My hgA1C is over 47.  Objective:   ROS Well dressed, well groomed elderly lady  Physical Exam  ROS  Encounter Medications:   Outpatient Encounter Prescriptions as of 01/18/2017  Medication Sig Note  . acetaminophen (TYLENOL) 325 MG tablet Take 325 mg by mouth every 6 (six) hours as needed for mild pain or fever.   . clopidogrel (PLAVIX) 75 MG tablet Take 1 tablet (75 mg total) by mouth daily with breakfast.   . cyclobenzaprine (FLEXERIL) 5 MG tablet Take 5 mg by mouth daily as needed. For spasms   . EPINEPHrine (EPIPEN) 0.3 mg/0.3 mL SOAJ injection Inject 0.3 mg into the muscle once as needed (for bee stings).   Marland Kitchen glimepiride (AMARYL) 2 MG tablet Take 2 mg by mouth daily before breakfast.   . insulin glargine (LANTUS) 100 UNIT/ML injection Inject 20 Units into the skin 2 (two) times daily. Pt's blood sugar is between 80-120 she uses 10 units if blood sugar is > 120 she uses 20 units   . Liraglutide (VICTOZA) 18 MG/3ML SOLN Inject 1.2 mg into the skin daily.    . mirabegron ER (MYRBETRIQ) 50 MG TB24 tablet Take 50 mg by mouth daily.   . Multiple Vitamins-Minerals (MULTIVITAMIN WITH MINERALS) tablet Take 1 tablet by mouth daily.   . pantoprazole (PROTONIX) 40 MG tablet Take 40 mg by mouth daily.   . [DISCONTINUED] amLODipine (NORVASC) 5 MG tablet Take 5 mg by mouth daily.   . [DISCONTINUED] amLODipine-olmesartan (AZOR) 5-40 MG per tablet Take 1 tablet by mouth daily. 01/19/2017: Per patient report---not taking anymore  . [DISCONTINUED] HYDROcodone-acetaminophen (VICODIN) 5-500 MG per tablet Take 1 tablet by mouth every 6 (six) hours as needed. For pain 01/19/2017: Per patient taking different dose.    . [DISCONTINUED] lisinopril (PRINIVIL,ZESTRIL) 10 MG tablet Take 10 mg by mouth daily. 01/19/2017: Per patient no longer taking  . [DISCONTINUED]  metFORMIN (GLUCOPHAGE) 1000 MG tablet Take 1,000 mg by mouth 2 (two) times daily with a meal. 01/19/2017: Per patient no longer taking  . [DISCONTINUED] rosuvastatin (CRESTOR) 5 MG tablet Take 4 tablets (20 mg total) by mouth daily.   . [DISCONTINUED] torsemide (DEMADEX) 20 MG tablet Take 20 mg by mouth daily.    No facility-administered encounter medications on file as of 01/18/2017.     Functional Status:   In your present state of health, do you have any difficulty performing the following activities: 01/18/2017 01/09/2017  Hearing? N N  Vision? Y N  Difficulty concentrating or making decisions? N N  Walking or climbing stairs? Y N  Dressing or bathing? N N  Doing errands, shopping? N N  Preparing Food and eating ? N N  Using the Toilet? N N  In the past six months, have you accidently leaked urine? Y Y  Do you have problems with loss of bowel control? Y Y  Managing your Medications? N N  Managing your Finances? N N  Housekeeping or managing your Housekeeping? Y Y  Some recent data might be hidden    Fall/Depression Screening:    PHQ 2/9 Scores 01/18/2017 01/09/2017 01/02/2017  PHQ - 2 Score 0 0 0   [facility] CM Care Plan Problem One   Flowsheet Row Most Recent Value  Care Plan Problem One  patient has diabetes  Role Documenting the Problem One  Care Management  Coordinator  Care Plan for Problem One  Active  [facility] Long Term Goal (31-90 days)  In the next 31 days, patient will record her fasting glucose levels 28 out of 31 days.  [facility] Long Term Goal Start Date  01/18/17  Interventions for Problem One Long Term Goal  Initial home visit     [facility] CM Care Plan Problem Two   Flowsheet Row Most Recent Value  Care Plan Problem Two  feelings of lethargy  Role Documenting the Problem Two  Care Management Coordinator  Care Plan for Problem Two  Active     Assessment:   Patient and RNCM collaborated to  Formulate her case management goals. Patient also met with Karrie Meres, [facility] for medication,  reconcilation.  Plan:  Home visit next week for community care coordination, diabetes education

## 2017-01-25 ENCOUNTER — Other Ambulatory Visit: Payer: Self-pay

## 2017-01-25 DIAGNOSIS — R350 Frequency of micturition: Secondary | ICD-10-CM | POA: Diagnosis not present

## 2017-01-25 DIAGNOSIS — N3946 Mixed incontinence: Secondary | ICD-10-CM | POA: Diagnosis not present

## 2017-01-25 NOTE — Patient Outreach (Signed)
   Unsuccessful attempt made to contact patient via telephone to confirm visit to [facility] [employer] for orientation for [employer] Visit scheduled for this afternoon at 4 pm HIPPA compliant message left with this RNCM's contact information, and request for a return call.

## 2017-01-25 NOTE — Patient Outreach (Signed)
[facility]) Care Management  01/25/2017  Mary Harding 12/04/1934 [id number]   Successful attempt made to contact patient on 2nd attempt. Patient and RNCM agreed on visit to [facility] tomorrow instead of today. Plan: Make contact with patinet in the next 14 days.

## 2017-01-26 ENCOUNTER — Other Ambulatory Visit: Payer: Self-pay | Admitting: *Deleted

## 2017-01-26 ENCOUNTER — Other Ambulatory Visit: Payer: Self-pay

## 2017-01-26 NOTE — Patient Outreach (Signed)
[facility]) Care Management  01/26/2017  Mary Harding 1934-09-12 [id number]   Phone call to patient to re-schedule home visit to complete her Advanced Directive.  Home visit scheduled for 01/30/17 at 10:00am.   Sheralyn Boatman [facility] Care Management [phone]

## 2017-01-27 NOTE — Patient Outreach (Signed)
    This RNCM arrived at [facility], waited 20 minutes for patient to arrive.  Plan: Will make contact with patient in the next 21 days,

## 2017-01-29 ENCOUNTER — Ambulatory Visit: Payer: Medicare Other | Admitting: *Deleted

## 2017-01-30 ENCOUNTER — Encounter: Payer: Self-pay | Admitting: *Deleted

## 2017-01-30 ENCOUNTER — Other Ambulatory Visit: Payer: Self-pay | Admitting: *Deleted

## 2017-01-30 NOTE — Patient Outreach (Signed)
[facility]) Care Management  [facility] Social Work  01/30/2017  Mary Harding [birth year] [id number]  Subjective:  " I been having leg cramps all night, been happening on and off for the lat several years". " I have to rub and stretch them out a lot. "   Objective:   Encounter Medications:  Outpatient Encounter Prescriptions as of 01/30/2017  Medication Sig Note  . acetaminophen (TYLENOL) 325 MG tablet Take 325 mg by mouth every 6 (six) hours as needed for mild pain or fever.   Marland Kitchen amLODipine (NORVASC) 10 MG tablet Take 10 mg by mouth daily.   . carvedilol (COREG) 12.5 MG tablet Take 12.5 mg by mouth 2 (two) times daily with a meal.   . celecoxib (CELEBREX) 200 MG capsule Take 200 mg by mouth daily.   . clopidogrel (PLAVIX) 75 MG tablet Take 1 tablet (75 mg total) by mouth daily with breakfast.   . cyclobenzaprine (FLEXERIL) 5 MG tablet Take 5 mg by mouth daily as needed. For spasms   . Eluxadoline (VIBERZI) 75 MG TABS Take 1 tablet by mouth daily. 01/19/2017: Per patient report  . EPINEPHrine (EPIPEN) 0.3 mg/0.3 mL SOAJ injection Inject 0.3 mg into the muscle once as needed (for bee stings).   . furosemide (LASIX) 20 MG tablet Take 20 mg by mouth daily.   Marland Kitchen glimepiride (AMARYL) 2 MG tablet Take 2 mg by mouth daily before breakfast.   . HYDROcodone-acetaminophen (NORCO/VICODIN) 5-325 MG tablet Take 1 tablet by mouth every 6 (six) hours as needed for moderate pain.   Marland Kitchen insulin glargine (LANTUS) 100 UNIT/ML injection Inject 20 Units into the skin 2 (two) times daily. Pt's blood sugar is between 80-120 she uses 10 units if blood sugar is > 120 she uses 20 units   . Liraglutide (VICTOZA) 18 MG/3ML SOLN Inject 1.2 mg into the skin daily.    . mirabegron ER (MYRBETRIQ) 50 MG TB24 tablet Take 50 mg by mouth daily.   . Multiple Vitamins-Minerals (MULTIVITAMIN WITH MINERALS) tablet Take 1 tablet by mouth daily.   . nitroGLYCERIN (NITROSTAT) 0.4 MG SL tablet Place 0.4 mg under the tongue  every 5 (five) minutes as needed for chest pain. 01/19/2017: Per patient report  . pantoprazole (PROTONIX) 40 MG tablet Take 40 mg by mouth daily.   . rosuvastatin (CRESTOR) 20 MG tablet Take 20 mg by mouth daily.   . valsartan (DIOVAN) 320 MG tablet Take 320 mg by mouth daily.    No facility-administered encounter medications on file as of 01/30/2017.     Functional Status:  In your present state of health, do you have any difficulty performing the following activities: 01/18/2017 01/09/2017  Hearing? N N  Vision? Y N  Difficulty concentrating or making decisions? N N  Walking or climbing stairs? Y N  Dressing or bathing? N N  Doing errands, shopping? N N  Preparing Food and eating ? N N  Using the Toilet? N N  In the past six months, have you accidently leaked urine? Y Y  Do you have problems with loss of bowel control? Y Y  Managing your Medications? N N  Managing your Finances? N N  Housekeeping or managing your Housekeeping? [name]  Some recent data might be hidden    Fall/Depression Screening:  PHQ 2/9 Scores 01/25/2017 01/18/2017 01/09/2017 01/02/2017  PHQ - 2 Score 0 0 0 0    Assessment: Patient tired from being up all night with her legs cramping.  Patient  states that very little helps, she has discussed this with her doctor.  Patient has completed the first page of the Living Will and HCPOA document.  Per patient, she would like to discuss the Living Will section with her family before completing. Her daughter is out of town this week. This social worker reviewed the Living Will section with patient and benefits of having the document completed and given to her doctor.  Patient stated that she would complete the document and have it notarized.  Patient states that she would be able to complete this on her own.  Patient reported having no further questions, concerns or community resource needs at this time.  This social worker's contact information provided if any needs arise in the  future.  Plan: Patient to be closed at this time to social work.    Sheralyn Boatman [facility] Care Management [phone]

## 2017-02-08 ENCOUNTER — Other Ambulatory Visit: Payer: Self-pay

## 2017-02-09 ENCOUNTER — Other Ambulatory Visit: Payer: Self-pay | Admitting: Pharmacist

## 2017-02-09 NOTE — Patient Outreach (Signed)
    Telephone contact with patient for community care coordination. This RNCM and patient agreed to home visit for further chronic disease education.  Plan: Home visit in the next 21 days for community care coordination, chronic disease eduation

## 2017-02-09 NOTE — Patient Outreach (Signed)
[place] [facility]) Care Management  02/09/2017  Mary Harding [birth year] [id number]  Successful phone follow-up with patient, HIPAA details verified.    Following-up to determine if patient has further medication needs.  Patient states "everything is squared away," when asked how she is doing with her medications.   She reports she is filling her medication pill planner and denies missing doses.  Patient reports she ran out of liraglutide (Victoza) for "a few days" until she got samples from PCP.  She reports she continues to receive samples of liraglutide from her PCP.     Counseled patient that if/when her PCP issues prescription for liraglutide, she may have cost savings with a 90 day supply versus a 30 day supply.   Patient denies any further medication related questions/concerns.    Patient verbalized having [facility] CM Pharmacist phone number should she have new pharmacy related concerns in the future.   Plan:  Will close pharmacy case as patient reports no further pharmacy issues.    Karrie Meres, PharmD, [place] [phone]

## 2017-02-12 DIAGNOSIS — Z9181 History of falling: Secondary | ICD-10-CM | POA: Diagnosis not present

## 2017-02-12 DIAGNOSIS — K219 Gastro-esophageal reflux disease without esophagitis: Secondary | ICD-10-CM | POA: Diagnosis not present

## 2017-02-12 DIAGNOSIS — R269 Unspecified abnormalities of gait and mobility: Secondary | ICD-10-CM | POA: Diagnosis not present

## 2017-02-12 DIAGNOSIS — R10819 Abdominal tenderness, unspecified site: Secondary | ICD-10-CM | POA: Diagnosis not present

## 2017-02-13 DIAGNOSIS — N390 Urinary tract infection, site not specified: Secondary | ICD-10-CM | POA: Diagnosis not present

## 2017-02-15 ENCOUNTER — Other Ambulatory Visit: Payer: Self-pay

## 2017-02-18 NOTE — Patient Outreach (Signed)
[facility]) Care Management   02/18/2017  Mary Harding [birth year] [id number]  Mary Harding is an 83 y.o. female  Subjective:  I exercise, I eat right, I just cannot get my blood sugars down.   Objective:   ROS  Very polite, well dressed elderly petite lady.  Physical Exam ROS.  Encounter Medications:   Outpatient Encounter Prescriptions as of 02/15/2017  Medication Sig Note  . acetaminophen (TYLENOL) 325 MG tablet Take 325 mg by mouth every 6 (six) hours as needed for mild pain or fever.   Marland Kitchen amLODipine (NORVASC) 10 MG tablet Take 10 mg by mouth daily.   . carvedilol (COREG) 12.5 MG tablet Take 12.5 mg by mouth 2 (two) times daily with a meal.   . celecoxib (CELEBREX) 200 MG capsule Take 200 mg by mouth daily.   . clopidogrel (PLAVIX) 75 MG tablet Take 1 tablet (75 mg total) by mouth daily with breakfast.   . cyclobenzaprine (FLEXERIL) 5 MG tablet Take 5 mg by mouth daily as needed. For spasms   . Eluxadoline (VIBERZI) 75 MG TABS Take 1 tablet by mouth daily. 01/19/2017: Per patient report  . EPINEPHrine (EPIPEN) 0.3 mg/0.3 mL SOAJ injection Inject 0.3 mg into the muscle once as needed (for bee stings).   . furosemide (LASIX) 20 MG tablet Take 20 mg by mouth daily.   Marland Kitchen glimepiride (AMARYL) 2 MG tablet Take 2 mg by mouth daily before breakfast.   . HYDROcodone-acetaminophen (NORCO/VICODIN) 5-325 MG tablet Take 1 tablet by mouth every 6 (six) hours as needed for moderate pain.   Marland Kitchen insulin glargine (LANTUS) 100 UNIT/ML injection Inject 20 Units into the skin 2 (two) times daily. Pt's blood sugar is between 80-120 she uses 10 units if blood sugar is > 120 she uses 20 units   . Liraglutide (VICTOZA) 18 MG/3ML SOLN Inject 1.2 mg into the skin daily.    . mirabegron ER (MYRBETRIQ) 50 MG TB24 tablet Take 50 mg by mouth daily.   . Multiple Vitamins-Minerals (MULTIVITAMIN WITH MINERALS) tablet Take 1 tablet by mouth daily.   . nitroGLYCERIN (NITROSTAT) 0.4 MG SL tablet Place  0.4 mg under the tongue every 5 (five) minutes as needed for chest pain. 01/19/2017: Per patient report  . pantoprazole (PROTONIX) 40 MG tablet Take 40 mg by mouth daily.   . rosuvastatin (CRESTOR) 20 MG tablet Take 20 mg by mouth daily.   . valsartan (DIOVAN) 320 MG tablet Take 320 mg by mouth daily.    No facility-administered encounter medications on file as of 02/15/2017.     Functional Status:   In your present state of health, do you have any difficulty performing the following activities: 01/18/2017 01/09/2017  Hearing? N N  Vision? Y N  Difficulty concentrating or making decisions? N N  Walking or climbing stairs? Y N  Dressing or bathing? N N  Doing errands, shopping? N N  Preparing Food and eating ? N N  Using the Toilet? N N  In the past six months, have you accidently leaked urine? Y Y  Do you have problems with loss of bowel control? Y Y  Managing your Medications? N N  Managing your Finances? N N  Housekeeping or managing your Housekeeping? [name]  Some recent data might be hidden    Fall/Depression Screening:    PHQ 2/9 Scores 01/25/2017 01/18/2017 01/09/2017 01/02/2017  PHQ - 2 Score 0 0 0 0    Assessment:   Patient reports compliance with  medication regimen, exercises and consume a low carbohydrate diet. Patient endorses her intent to continue in her efforts to lower her glucose levels  Plan:   Telephone contact in the next 21 days for community care coordination, assessment of needs.

## 2017-02-28 DIAGNOSIS — R109 Unspecified abdominal pain: Secondary | ICD-10-CM | POA: Diagnosis not present

## 2017-02-28 DIAGNOSIS — N3281 Overactive bladder: Secondary | ICD-10-CM | POA: Diagnosis not present

## 2017-02-28 DIAGNOSIS — E118 Type 2 diabetes mellitus with unspecified complications: Secondary | ICD-10-CM | POA: Diagnosis not present

## 2017-02-28 DIAGNOSIS — K5900 Constipation, unspecified: Secondary | ICD-10-CM | POA: Diagnosis not present

## 2017-03-27 DIAGNOSIS — E118 Type 2 diabetes mellitus with unspecified complications: Secondary | ICD-10-CM | POA: Diagnosis not present

## 2017-03-29 ENCOUNTER — Other Ambulatory Visit: Payer: Self-pay

## 2017-03-30 NOTE — Patient Outreach (Signed)
    Unsuccessful attempt made to contact patient via telephone for community care coordination.  Plan: Will attempt to make contact with patient in the next 21 days.

## 2017-04-02 ENCOUNTER — Other Ambulatory Visit: Payer: Self-pay
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
## 2017-04-04 ENCOUNTER — Other Ambulatory Visit: Payer: Self-pay
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
## 2017-04-23 DIAGNOSIS — E113293 Type 2 diabetes mellitus with mild nonproliferative diabetic retinopathy without macular edema, bilateral: Secondary | ICD-10-CM | POA: Diagnosis not present

## 2017-04-23 DIAGNOSIS — H401131 Primary open-angle glaucoma, bilateral, mild stage: Secondary | ICD-10-CM | POA: Diagnosis not present

## 2017-04-23 DIAGNOSIS — E119 Type 2 diabetes mellitus without complications: Secondary | ICD-10-CM | POA: Diagnosis not present

## 2017-05-09 DIAGNOSIS — I10 Essential (primary) hypertension: Secondary | ICD-10-CM | POA: Diagnosis not present

## 2017-05-09 DIAGNOSIS — E118 Type 2 diabetes mellitus with unspecified complications: Secondary | ICD-10-CM | POA: Diagnosis not present

## 2017-05-09 DIAGNOSIS — N3281 Overactive bladder: Secondary | ICD-10-CM | POA: Diagnosis not present

## 2017-06-07 DIAGNOSIS — I10 Essential (primary) hypertension: Secondary | ICD-10-CM | POA: Diagnosis not present

## 2017-06-07 DIAGNOSIS — E118 Type 2 diabetes mellitus with unspecified complications: Secondary | ICD-10-CM | POA: Diagnosis not present

## 2017-06-14 DIAGNOSIS — I10 Essential (primary) hypertension: Secondary | ICD-10-CM | POA: Diagnosis not present

## 2017-06-14 DIAGNOSIS — E789 Disorder of lipoprotein metabolism, unspecified: Secondary | ICD-10-CM | POA: Diagnosis not present

## 2017-06-14 DIAGNOSIS — E118 Type 2 diabetes mellitus with unspecified complications: Secondary | ICD-10-CM | POA: Diagnosis not present

## 2017-07-03 DIAGNOSIS — M1990 Unspecified osteoarthritis, unspecified site: Secondary | ICD-10-CM | POA: Diagnosis not present

## 2017-07-03 DIAGNOSIS — M549 Dorsalgia, unspecified: Secondary | ICD-10-CM | POA: Diagnosis not present

## 2017-09-25 DIAGNOSIS — R21 Rash and other nonspecific skin eruption: Secondary | ICD-10-CM | POA: Diagnosis not present

## 2017-09-25 DIAGNOSIS — L989 Disorder of the skin and subcutaneous tissue, unspecified: Secondary | ICD-10-CM | POA: Diagnosis not present

## 2017-09-25 DIAGNOSIS — W57XXXA Bitten or stung by nonvenomous insect and other nonvenomous arthropods, initial encounter: Secondary | ICD-10-CM | POA: Diagnosis not present

## 2017-10-02 DIAGNOSIS — L820 Inflamed seborrheic keratosis: Secondary | ICD-10-CM | POA: Diagnosis not present

## 2017-10-09 ENCOUNTER — Emergency Department (HOSPITAL_COMMUNITY): Payer: No Typology Code available for payment source

## 2017-10-09 ENCOUNTER — Emergency Department (HOSPITAL_COMMUNITY)
Admission: EM | Admit: 2017-10-09 | Discharge: 2017-10-09 | Disposition: A | Discharge status: Home / Self Care | Payer: No Typology Code available for payment source | Attending: Emergency Medicine | Admitting: Emergency Medicine

## 2017-10-09 ENCOUNTER — Encounter (HOSPITAL_COMMUNITY): Payer: Self-pay | Admitting: Emergency Medicine

## 2017-10-09 DIAGNOSIS — Z7902 Long term (current) use of antithrombotics/antiplatelets: Secondary | ICD-10-CM | POA: Insufficient documentation

## 2017-10-09 DIAGNOSIS — Z79899 Other long term (current) drug therapy: Secondary | ICD-10-CM | POA: Insufficient documentation

## 2017-10-09 DIAGNOSIS — I10 Essential (primary) hypertension: Secondary | ICD-10-CM | POA: Diagnosis not present

## 2017-10-09 DIAGNOSIS — M545 Low back pain: Secondary | ICD-10-CM | POA: Insufficient documentation

## 2017-10-09 DIAGNOSIS — E1140 Type 2 diabetes mellitus with diabetic neuropathy, unspecified: Secondary | ICD-10-CM | POA: Insufficient documentation

## 2017-10-09 DIAGNOSIS — M546 Pain in thoracic spine: Secondary | ICD-10-CM | POA: Diagnosis not present

## 2017-10-09 DIAGNOSIS — Z794 Long term (current) use of insulin: Secondary | ICD-10-CM | POA: Insufficient documentation

## 2017-10-09 DIAGNOSIS — S79912A Unspecified injury of left hip, initial encounter: Secondary | ICD-10-CM | POA: Diagnosis not present

## 2017-10-09 DIAGNOSIS — M25552 Pain in left hip: Secondary | ICD-10-CM | POA: Diagnosis not present

## 2017-10-09 DIAGNOSIS — I25110 Atherosclerotic heart disease of native coronary artery with unstable angina pectoris: Secondary | ICD-10-CM | POA: Insufficient documentation

## 2017-10-09 DIAGNOSIS — F17220 Nicotine dependence, chewing tobacco, uncomplicated: Secondary | ICD-10-CM | POA: Insufficient documentation

## 2017-10-09 LAB — CBG MONITORING, ED: GLUCOSE-CAPILLARY: 117 mg/dL — AB (ref 65–99)

## 2017-10-09 MED ORDER — HYDROCODONE-ACETAMINOPHEN 5-325 MG PO TABS
1.0000 | ORAL_TABLET | Freq: Once | ORAL | Status: AC
  Administered 2017-10-09: 1 via ORAL
  Filled 2017-10-09: qty 1

## 2017-10-09 MED ORDER — CYCLOBENZAPRINE HCL 10 MG PO TABS: 10.0000 mg | ORAL_TABLET | Freq: Two times a day (BID) | ORAL | 0 refills | Status: DC | PRN
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
## 2017-10-09 NOTE — ED Triage Notes (Signed)
CBG 117 
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
## 2017-10-11 DIAGNOSIS — I10 Essential (primary) hypertension: Secondary | ICD-10-CM | POA: Diagnosis not present

## 2017-10-11 DIAGNOSIS — E118 Type 2 diabetes mellitus with unspecified complications: Secondary | ICD-10-CM | POA: Diagnosis not present

## 2017-10-15 DIAGNOSIS — Z23 Encounter for immunization: Secondary | ICD-10-CM | POA: Diagnosis not present

## 2017-10-15 DIAGNOSIS — I10 Essential (primary) hypertension: Secondary | ICD-10-CM | POA: Diagnosis not present

## 2017-10-15 DIAGNOSIS — E118 Type 2 diabetes mellitus with unspecified complications: Secondary | ICD-10-CM | POA: Diagnosis not present

## 2017-10-15 DIAGNOSIS — E789 Disorder of lipoprotein metabolism, unspecified: Secondary | ICD-10-CM | POA: Diagnosis not present

## 2017-10-15 DIAGNOSIS — I2510 Atherosclerotic heart disease of native coronary artery without angina pectoris: Secondary | ICD-10-CM | POA: Diagnosis not present

## 2017-12-06 DIAGNOSIS — E118 Type 2 diabetes mellitus with unspecified complications: Secondary | ICD-10-CM | POA: Diagnosis not present

## 2017-12-06 DIAGNOSIS — I10 Essential (primary) hypertension: Secondary | ICD-10-CM | POA: Diagnosis not present

## 2017-12-26 DIAGNOSIS — I2510 Atherosclerotic heart disease of native coronary artery without angina pectoris: Secondary | ICD-10-CM | POA: Diagnosis not present

## 2017-12-26 DIAGNOSIS — E1140 Type 2 diabetes mellitus with diabetic neuropathy, unspecified: Secondary | ICD-10-CM | POA: Diagnosis not present

## 2017-12-26 DIAGNOSIS — I10 Essential (primary) hypertension: Secondary | ICD-10-CM | POA: Diagnosis not present

## 2017-12-26 DIAGNOSIS — E08319 Diabetes mellitus due to underlying condition with unspecified diabetic retinopathy without macular edema: Secondary | ICD-10-CM | POA: Diagnosis not present

## 2017-12-26 DIAGNOSIS — E118 Type 2 diabetes mellitus with unspecified complications: Secondary | ICD-10-CM | POA: Diagnosis not present

## 2017-12-26 DIAGNOSIS — E789 Disorder of lipoprotein metabolism, unspecified: Secondary | ICD-10-CM | POA: Diagnosis not present

## 2017-12-27 DIAGNOSIS — H401131 Primary open-angle glaucoma, bilateral, mild stage: Secondary | ICD-10-CM | POA: Diagnosis not present

## 2018-01-02 DIAGNOSIS — I10 Essential (primary) hypertension: Secondary | ICD-10-CM | POA: Diagnosis not present

## 2018-01-02 DIAGNOSIS — E118 Type 2 diabetes mellitus with unspecified complications: Secondary | ICD-10-CM | POA: Diagnosis not present

## 2018-01-16 DIAGNOSIS — E7800 Pure hypercholesterolemia, unspecified: Secondary | ICD-10-CM | POA: Diagnosis not present

## 2018-01-16 DIAGNOSIS — I2510 Atherosclerotic heart disease of native coronary artery without angina pectoris: Secondary | ICD-10-CM | POA: Diagnosis not present

## 2018-01-16 DIAGNOSIS — E1165 Type 2 diabetes mellitus with hyperglycemia: Secondary | ICD-10-CM | POA: Diagnosis not present

## 2018-01-16 DIAGNOSIS — K219 Gastro-esophageal reflux disease without esophagitis: Secondary | ICD-10-CM | POA: Diagnosis not present

## 2018-01-16 DIAGNOSIS — E1121 Type 2 diabetes mellitus with diabetic nephropathy: Secondary | ICD-10-CM | POA: Diagnosis not present

## 2018-01-16 DIAGNOSIS — I10 Essential (primary) hypertension: Secondary | ICD-10-CM | POA: Diagnosis not present

## 2018-01-16 DIAGNOSIS — F329 Major depressive disorder, single episode, unspecified: Secondary | ICD-10-CM | POA: Diagnosis not present

## 2018-01-16 DIAGNOSIS — N189 Chronic kidney disease, unspecified: Secondary | ICD-10-CM | POA: Diagnosis not present

## 2018-01-31 DIAGNOSIS — I2510 Atherosclerotic heart disease of native coronary artery without angina pectoris: Secondary | ICD-10-CM | POA: Diagnosis not present

## 2018-01-31 DIAGNOSIS — N390 Urinary tract infection, site not specified: Secondary | ICD-10-CM | POA: Diagnosis not present

## 2018-01-31 DIAGNOSIS — N189 Chronic kidney disease, unspecified: Secondary | ICD-10-CM | POA: Diagnosis not present

## 2018-01-31 DIAGNOSIS — F329 Major depressive disorder, single episode, unspecified: Secondary | ICD-10-CM | POA: Diagnosis not present

## 2018-01-31 DIAGNOSIS — E1121 Type 2 diabetes mellitus with diabetic nephropathy: Secondary | ICD-10-CM | POA: Diagnosis not present

## 2018-01-31 DIAGNOSIS — M19041 Primary osteoarthritis, right hand: Secondary | ICD-10-CM | POA: Diagnosis not present

## 2018-01-31 DIAGNOSIS — N183 Chronic kidney disease, stage 3 (moderate): Secondary | ICD-10-CM | POA: Diagnosis not present

## 2018-01-31 DIAGNOSIS — I10 Essential (primary) hypertension: Secondary | ICD-10-CM | POA: Diagnosis not present

## 2018-02-01 DIAGNOSIS — I10 Essential (primary) hypertension: Secondary | ICD-10-CM | POA: Diagnosis not present

## 2018-02-01 DIAGNOSIS — I2510 Atherosclerotic heart disease of native coronary artery without angina pectoris: Secondary | ICD-10-CM | POA: Diagnosis not present

## 2018-02-01 DIAGNOSIS — E1165 Type 2 diabetes mellitus with hyperglycemia: Secondary | ICD-10-CM | POA: Diagnosis not present

## 2018-02-01 DIAGNOSIS — E1121 Type 2 diabetes mellitus with diabetic nephropathy: Secondary | ICD-10-CM | POA: Diagnosis not present

## 2018-02-01 DIAGNOSIS — N189 Chronic kidney disease, unspecified: Secondary | ICD-10-CM | POA: Diagnosis not present

## 2018-02-01 DIAGNOSIS — E08319 Diabetes mellitus due to underlying condition with unspecified diabetic retinopathy without macular edema: Secondary | ICD-10-CM | POA: Diagnosis not present

## 2018-02-01 DIAGNOSIS — F329 Major depressive disorder, single episode, unspecified: Secondary | ICD-10-CM | POA: Diagnosis not present

## 2018-02-01 DIAGNOSIS — E118 Type 2 diabetes mellitus with unspecified complications: Secondary | ICD-10-CM | POA: Diagnosis not present

## 2018-02-01 DIAGNOSIS — N183 Chronic kidney disease, stage 3 (moderate): Secondary | ICD-10-CM | POA: Diagnosis not present

## 2018-02-05 DIAGNOSIS — M064 Inflammatory polyarthropathy: Secondary | ICD-10-CM | POA: Diagnosis not present

## 2018-02-05 DIAGNOSIS — M25539 Pain in unspecified wrist: Secondary | ICD-10-CM | POA: Diagnosis not present

## 2018-02-05 DIAGNOSIS — E789 Disorder of lipoprotein metabolism, unspecified: Secondary | ICD-10-CM | POA: Diagnosis not present

## 2018-02-05 DIAGNOSIS — M19042 Primary osteoarthritis, left hand: Secondary | ICD-10-CM | POA: Diagnosis not present

## 2018-02-05 DIAGNOSIS — E118 Type 2 diabetes mellitus with unspecified complications: Secondary | ICD-10-CM | POA: Diagnosis not present

## 2018-02-05 DIAGNOSIS — M1990 Unspecified osteoarthritis, unspecified site: Secondary | ICD-10-CM | POA: Diagnosis not present

## 2018-02-05 DIAGNOSIS — M79643 Pain in unspecified hand: Secondary | ICD-10-CM | POA: Diagnosis not present

## 2018-02-05 DIAGNOSIS — E875 Hyperkalemia: Secondary | ICD-10-CM | POA: Diagnosis not present

## 2018-02-05 DIAGNOSIS — E08319 Diabetes mellitus due to underlying condition with unspecified diabetic retinopathy without macular edema: Secondary | ICD-10-CM | POA: Diagnosis not present

## 2018-02-05 DIAGNOSIS — M19041 Primary osteoarthritis, right hand: Secondary | ICD-10-CM | POA: Diagnosis not present

## 2018-02-05 DIAGNOSIS — M549 Dorsalgia, unspecified: Secondary | ICD-10-CM | POA: Diagnosis not present

## 2018-02-11 DIAGNOSIS — E875 Hyperkalemia: Secondary | ICD-10-CM | POA: Diagnosis not present

## 2018-02-12 DIAGNOSIS — N184 Chronic kidney disease, stage 4 (severe): Secondary | ICD-10-CM | POA: Diagnosis not present

## 2018-02-12 DIAGNOSIS — E118 Type 2 diabetes mellitus with unspecified complications: Secondary | ICD-10-CM | POA: Diagnosis not present

## 2018-02-12 DIAGNOSIS — E1129 Type 2 diabetes mellitus with other diabetic kidney complication: Secondary | ICD-10-CM | POA: Diagnosis not present

## 2018-02-12 DIAGNOSIS — I10 Essential (primary) hypertension: Secondary | ICD-10-CM | POA: Diagnosis not present

## 2018-02-12 DIAGNOSIS — E789 Disorder of lipoprotein metabolism, unspecified: Secondary | ICD-10-CM | POA: Diagnosis not present

## 2018-03-05 DIAGNOSIS — N184 Chronic kidney disease, stage 4 (severe): Secondary | ICD-10-CM | POA: Diagnosis not present

## 2018-03-05 DIAGNOSIS — E7801 Familial hypercholesterolemia: Secondary | ICD-10-CM | POA: Diagnosis not present

## 2018-03-05 DIAGNOSIS — E118 Type 2 diabetes mellitus with unspecified complications: Secondary | ICD-10-CM | POA: Diagnosis not present

## 2018-03-05 DIAGNOSIS — I10 Essential (primary) hypertension: Secondary | ICD-10-CM | POA: Diagnosis not present

## 2018-03-05 DIAGNOSIS — E1129 Type 2 diabetes mellitus with other diabetic kidney complication: Secondary | ICD-10-CM | POA: Diagnosis not present

## 2018-03-20 DIAGNOSIS — E876 Hypokalemia: Secondary | ICD-10-CM | POA: Diagnosis not present

## 2018-03-20 DIAGNOSIS — N183 Chronic kidney disease, stage 3 (moderate): Secondary | ICD-10-CM | POA: Diagnosis not present

## 2018-03-20 DIAGNOSIS — R197 Diarrhea, unspecified: Secondary | ICD-10-CM | POA: Diagnosis not present

## 2018-03-20 DIAGNOSIS — R32 Unspecified urinary incontinence: Secondary | ICD-10-CM | POA: Diagnosis not present

## 2018-03-20 DIAGNOSIS — R109 Unspecified abdominal pain: Secondary | ICD-10-CM | POA: Diagnosis not present

## 2018-03-20 DIAGNOSIS — E118 Type 2 diabetes mellitus with unspecified complications: Secondary | ICD-10-CM | POA: Diagnosis not present

## 2018-03-20 DIAGNOSIS — N390 Urinary tract infection, site not specified: Secondary | ICD-10-CM | POA: Diagnosis not present

## 2018-03-20 DIAGNOSIS — I10 Essential (primary) hypertension: Secondary | ICD-10-CM | POA: Diagnosis not present

## 2018-03-27 DIAGNOSIS — E789 Disorder of lipoprotein metabolism, unspecified: Secondary | ICD-10-CM | POA: Diagnosis not present

## 2018-03-27 DIAGNOSIS — E876 Hypokalemia: Secondary | ICD-10-CM | POA: Diagnosis not present

## 2018-03-27 DIAGNOSIS — N184 Chronic kidney disease, stage 4 (severe): Secondary | ICD-10-CM | POA: Diagnosis not present

## 2018-03-27 DIAGNOSIS — E118 Type 2 diabetes mellitus with unspecified complications: Secondary | ICD-10-CM | POA: Diagnosis not present

## 2018-03-27 DIAGNOSIS — E1140 Type 2 diabetes mellitus with diabetic neuropathy, unspecified: Secondary | ICD-10-CM | POA: Diagnosis not present

## 2018-03-27 DIAGNOSIS — I10 Essential (primary) hypertension: Secondary | ICD-10-CM | POA: Diagnosis not present

## 2018-03-28 DIAGNOSIS — R197 Diarrhea, unspecified: Secondary | ICD-10-CM | POA: Diagnosis not present

## 2018-03-28 DIAGNOSIS — R109 Unspecified abdominal pain: Secondary | ICD-10-CM | POA: Diagnosis not present

## 2018-03-28 DIAGNOSIS — R159 Full incontinence of feces: Secondary | ICD-10-CM | POA: Diagnosis not present

## 2018-03-29 DIAGNOSIS — R109 Unspecified abdominal pain: Secondary | ICD-10-CM | POA: Diagnosis not present

## 2018-03-29 DIAGNOSIS — R197 Diarrhea, unspecified: Secondary | ICD-10-CM | POA: Diagnosis not present

## 2018-04-04 DIAGNOSIS — R197 Diarrhea, unspecified: Secondary | ICD-10-CM | POA: Diagnosis not present

## 2018-04-04 DIAGNOSIS — R159 Full incontinence of feces: Secondary | ICD-10-CM | POA: Diagnosis not present

## 2018-04-05 ENCOUNTER — Ambulatory Visit: Payer: Medicare Other

## 2018-04-05 ENCOUNTER — Encounter: Payer: Self-pay | Admitting: Registered"

## 2018-04-05 ENCOUNTER — Encounter: Payer: Medicare Other | Attending: Internal Medicine | Admitting: Registered"

## 2018-04-05 DIAGNOSIS — E119 Type 2 diabetes mellitus without complications: Secondary | ICD-10-CM | POA: Insufficient documentation

## 2018-04-05 DIAGNOSIS — E1165 Type 2 diabetes mellitus with hyperglycemia: Secondary | ICD-10-CM

## 2018-04-05 DIAGNOSIS — Z713 Dietary counseling and surveillance: Secondary | ICD-10-CM | POA: Diagnosis not present
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
## 2018-04-05 NOTE — Patient Instructions (Signed)
Dietary changes to consider: Breakfast - try fiber one cereal and a handful of nuts or some other protein Lunch - try to get more non-starchy veggies Dinner - you can increase your carb at dinner, may help to prevent low blood sugar in the morning. Snack - Consider having vegetables, or [place] yogurt, or if you have an apple make sure to have protein with it. Having less fruit, more vegetables may help bulk up your stools

## 2018-04-08 DIAGNOSIS — E1165 Type 2 diabetes mellitus with hyperglycemia: Secondary | ICD-10-CM | POA: Insufficient documentation

## 2018-04-17 DIAGNOSIS — N183 Chronic kidney disease, stage 3 (moderate): Secondary | ICD-10-CM | POA: Diagnosis not present

## 2018-04-17 DIAGNOSIS — D631 Anemia in chronic kidney disease: Secondary | ICD-10-CM | POA: Diagnosis not present

## 2018-04-17 DIAGNOSIS — I129 Hypertensive chronic kidney disease with stage 1 through stage 4 chronic kidney disease, or unspecified chronic kidney disease: Secondary | ICD-10-CM | POA: Diagnosis not present

## 2018-04-17 DIAGNOSIS — N184 Chronic kidney disease, stage 4 (severe): Secondary | ICD-10-CM | POA: Diagnosis not present

## 2018-04-18 DIAGNOSIS — R197 Diarrhea, unspecified: Secondary | ICD-10-CM | POA: Diagnosis not present

## 2018-04-18 DIAGNOSIS — I2510 Atherosclerotic heart disease of native coronary artery without angina pectoris: Secondary | ICD-10-CM | POA: Diagnosis not present

## 2018-04-18 DIAGNOSIS — K219 Gastro-esophageal reflux disease without esophagitis: Secondary | ICD-10-CM | POA: Diagnosis not present

## 2018-04-18 DIAGNOSIS — R5383 Other fatigue: Secondary | ICD-10-CM | POA: Diagnosis not present

## 2018-04-18 DIAGNOSIS — R109 Unspecified abdominal pain: Secondary | ICD-10-CM | POA: Diagnosis not present

## 2018-04-18 DIAGNOSIS — M545 Low back pain: Secondary | ICD-10-CM | POA: Diagnosis not present

## 2018-04-22 ENCOUNTER — Other Ambulatory Visit: Payer: Self-pay | Admitting: Nephrology

## 2018-04-22 DIAGNOSIS — N184 Chronic kidney disease, stage 4 (severe): Secondary | ICD-10-CM

## 2018-04-25 DIAGNOSIS — I25118 Atherosclerotic heart disease of native coronary artery with other forms of angina pectoris: Secondary | ICD-10-CM | POA: Diagnosis not present

## 2018-04-25 DIAGNOSIS — E1122 Type 2 diabetes mellitus with diabetic chronic kidney disease: Secondary | ICD-10-CM | POA: Diagnosis not present

## 2018-04-25 DIAGNOSIS — Z0189 Encounter for other specified special examinations: Secondary | ICD-10-CM | POA: Diagnosis not present

## 2018-04-25 DIAGNOSIS — N183 Chronic kidney disease, stage 3 (moderate): Secondary | ICD-10-CM | POA: Diagnosis not present

## 2018-04-25 DIAGNOSIS — E7800 Pure hypercholesterolemia, unspecified: Secondary | ICD-10-CM | POA: Diagnosis not present

## 2018-04-29 ENCOUNTER — Ambulatory Visit
Admission: RE | Admit: 2018-04-29 | Discharge: 2018-04-29 | Disposition: A | Discharge status: Home / Self Care | Payer: Medicare Other | Source: Ambulatory Visit | Attending: Nephrology | Admitting: Nephrology

## 2018-04-29 DIAGNOSIS — N184 Chronic kidney disease, stage 4 (severe): Secondary | ICD-10-CM | POA: Diagnosis not present

## 2018-05-02 DIAGNOSIS — R159 Full incontinence of feces: Secondary | ICD-10-CM | POA: Diagnosis not present

## 2018-05-06 ENCOUNTER — Other Ambulatory Visit (HOSPITAL_COMMUNITY): Payer: Self-pay | Admitting: *Deleted

## 2018-05-07 ENCOUNTER — Ambulatory Visit (HOSPITAL_COMMUNITY)
Admission: RE | Admit: 2018-05-07 | Discharge: 2018-05-07 | Disposition: A | Discharge status: Home / Self Care | Payer: Medicare Other | Source: Ambulatory Visit | Attending: Nephrology | Admitting: Nephrology

## 2018-05-07 DIAGNOSIS — Z5181 Encounter for therapeutic drug level monitoring: Secondary | ICD-10-CM | POA: Diagnosis not present

## 2018-05-07 DIAGNOSIS — N184 Chronic kidney disease, stage 4 (severe): Secondary | ICD-10-CM | POA: Insufficient documentation

## 2018-05-07 DIAGNOSIS — D631 Anemia in chronic kidney disease: Secondary | ICD-10-CM | POA: Diagnosis not present

## 2018-05-07 DIAGNOSIS — Z79899 Other long term (current) drug therapy: Secondary | ICD-10-CM | POA: Insufficient documentation

## 2018-05-07 LAB — POCT HEMOGLOBIN-HEMACUE: Hemoglobin: 11 g/dL — ABNORMAL LOW (ref 12.0–15.0)

## 2018-05-07 MED ORDER — DARBEPOETIN ALFA 40 MCG/0.4ML IJ SOSY
PREFILLED_SYRINGE | INTRAMUSCULAR | Status: AC
  Administered 2018-05-07: 11:00:00 40 ug via SUBCUTANEOUS
  Filled 2018-05-07: qty 0.4

## 2018-05-07 MED ORDER — DARBEPOETIN ALFA 40 MCG/0.4ML IJ SOSY
40.0000 ug | PREFILLED_SYRINGE | Freq: Once | INTRAMUSCULAR | Status: AC
  Administered 2018-05-07: 40 ug via SUBCUTANEOUS

## 2018-05-07 NOTE — Discharge Instructions (Signed)

## 2018-05-14 DIAGNOSIS — N3946 Mixed incontinence: Secondary | ICD-10-CM | POA: Diagnosis not present

## 2018-05-14 DIAGNOSIS — R351 Nocturia: Secondary | ICD-10-CM | POA: Diagnosis not present

## 2018-05-20 DIAGNOSIS — I209 Angina pectoris, unspecified: Secondary | ICD-10-CM | POA: Diagnosis not present

## 2018-05-22 DIAGNOSIS — E118 Type 2 diabetes mellitus with unspecified complications: Secondary | ICD-10-CM | POA: Diagnosis not present

## 2018-05-22 DIAGNOSIS — I10 Essential (primary) hypertension: Secondary | ICD-10-CM | POA: Diagnosis not present

## 2018-05-22 DIAGNOSIS — N390 Urinary tract infection, site not specified: Secondary | ICD-10-CM | POA: Diagnosis not present

## 2018-05-29 DIAGNOSIS — N184 Chronic kidney disease, stage 4 (severe): Secondary | ICD-10-CM | POA: Diagnosis not present

## 2018-05-29 DIAGNOSIS — E876 Hypokalemia: Secondary | ICD-10-CM | POA: Diagnosis not present

## 2018-05-29 DIAGNOSIS — I10 Essential (primary) hypertension: Secondary | ICD-10-CM | POA: Diagnosis not present

## 2018-05-29 DIAGNOSIS — E118 Type 2 diabetes mellitus with unspecified complications: Secondary | ICD-10-CM | POA: Diagnosis not present

## 2018-05-29 DIAGNOSIS — E789 Disorder of lipoprotein metabolism, unspecified: Secondary | ICD-10-CM | POA: Diagnosis not present

## 2018-05-30 DIAGNOSIS — I10 Essential (primary) hypertension: Secondary | ICD-10-CM | POA: Diagnosis not present

## 2018-05-30 DIAGNOSIS — I2510 Atherosclerotic heart disease of native coronary artery without angina pectoris: Secondary | ICD-10-CM | POA: Diagnosis not present

## 2018-05-30 DIAGNOSIS — N183 Chronic kidney disease, stage 3 (moderate): Secondary | ICD-10-CM | POA: Diagnosis not present

## 2018-05-30 DIAGNOSIS — E876 Hypokalemia: Secondary | ICD-10-CM | POA: Diagnosis not present

## 2018-05-30 DIAGNOSIS — Z0000 Encounter for general adult medical examination without abnormal findings: Secondary | ICD-10-CM | POA: Diagnosis not present

## 2018-05-30 DIAGNOSIS — E1165 Type 2 diabetes mellitus with hyperglycemia: Secondary | ICD-10-CM | POA: Diagnosis not present

## 2018-06-11 DIAGNOSIS — E1122 Type 2 diabetes mellitus with diabetic chronic kidney disease: Secondary | ICD-10-CM | POA: Diagnosis not present

## 2018-06-11 DIAGNOSIS — N183 Chronic kidney disease, stage 3 (moderate): Secondary | ICD-10-CM | POA: Diagnosis not present

## 2018-06-11 DIAGNOSIS — I25118 Atherosclerotic heart disease of native coronary artery with other forms of angina pectoris: Secondary | ICD-10-CM | POA: Diagnosis not present

## 2018-06-11 DIAGNOSIS — I739 Peripheral vascular disease, unspecified: Secondary | ICD-10-CM | POA: Diagnosis not present

## 2018-06-12 DIAGNOSIS — E789 Disorder of lipoprotein metabolism, unspecified: Secondary | ICD-10-CM | POA: Diagnosis not present

## 2018-06-12 DIAGNOSIS — I10 Essential (primary) hypertension: Secondary | ICD-10-CM | POA: Diagnosis not present

## 2018-06-12 DIAGNOSIS — N184 Chronic kidney disease, stage 4 (severe): Secondary | ICD-10-CM | POA: Diagnosis not present

## 2018-06-12 DIAGNOSIS — E1121 Type 2 diabetes mellitus with diabetic nephropathy: Secondary | ICD-10-CM | POA: Diagnosis not present

## 2018-06-12 DIAGNOSIS — E118 Type 2 diabetes mellitus with unspecified complications: Secondary | ICD-10-CM | POA: Diagnosis not present

## 2018-06-18 DIAGNOSIS — I10 Essential (primary) hypertension: Secondary | ICD-10-CM | POA: Diagnosis not present

## 2018-06-18 DIAGNOSIS — F339 Major depressive disorder, recurrent, unspecified: Secondary | ICD-10-CM | POA: Diagnosis not present

## 2018-06-27 DIAGNOSIS — R351 Nocturia: Secondary | ICD-10-CM | POA: Diagnosis not present

## 2018-06-27 DIAGNOSIS — M545 Low back pain: Secondary | ICD-10-CM | POA: Diagnosis not present

## 2018-06-27 DIAGNOSIS — F329 Major depressive disorder, single episode, unspecified: Secondary | ICD-10-CM | POA: Diagnosis not present

## 2018-07-03 DIAGNOSIS — N184 Chronic kidney disease, stage 4 (severe): Secondary | ICD-10-CM | POA: Diagnosis not present

## 2018-07-03 DIAGNOSIS — I10 Essential (primary) hypertension: Secondary | ICD-10-CM | POA: Diagnosis not present

## 2018-07-03 DIAGNOSIS — E118 Type 2 diabetes mellitus with unspecified complications: Secondary | ICD-10-CM | POA: Diagnosis not present

## 2018-07-03 DIAGNOSIS — E789 Disorder of lipoprotein metabolism, unspecified: Secondary | ICD-10-CM | POA: Diagnosis not present

## 2018-07-03 DIAGNOSIS — E1129 Type 2 diabetes mellitus with other diabetic kidney complication: Secondary | ICD-10-CM | POA: Diagnosis not present

## 2018-07-03 DIAGNOSIS — R7989 Other specified abnormal findings of blood chemistry: Secondary | ICD-10-CM | POA: Diagnosis not present

## 2018-07-18 DIAGNOSIS — I739 Peripheral vascular disease, unspecified: Secondary | ICD-10-CM | POA: Diagnosis not present

## 2018-07-22 DIAGNOSIS — N2581 Secondary hyperparathyroidism of renal origin: Secondary | ICD-10-CM | POA: Diagnosis not present

## 2018-07-22 DIAGNOSIS — I129 Hypertensive chronic kidney disease with stage 1 through stage 4 chronic kidney disease, or unspecified chronic kidney disease: Secondary | ICD-10-CM | POA: Diagnosis not present

## 2018-07-22 DIAGNOSIS — D631 Anemia in chronic kidney disease: Secondary | ICD-10-CM | POA: Diagnosis not present

## 2018-07-22 DIAGNOSIS — N184 Chronic kidney disease, stage 4 (severe): Secondary | ICD-10-CM | POA: Diagnosis not present

## 2018-07-23 DIAGNOSIS — E118 Type 2 diabetes mellitus with unspecified complications: Secondary | ICD-10-CM | POA: Diagnosis not present

## 2018-07-23 DIAGNOSIS — I10 Essential (primary) hypertension: Secondary | ICD-10-CM | POA: Diagnosis not present

## 2018-07-23 DIAGNOSIS — R7989 Other specified abnormal findings of blood chemistry: Secondary | ICD-10-CM | POA: Diagnosis not present

## 2018-07-23 DIAGNOSIS — E789 Disorder of lipoprotein metabolism, unspecified: Secondary | ICD-10-CM | POA: Diagnosis not present

## 2018-07-23 DIAGNOSIS — E1149 Type 2 diabetes mellitus with other diabetic neurological complication: Secondary | ICD-10-CM | POA: Diagnosis not present

## 2018-07-23 DIAGNOSIS — N183 Chronic kidney disease, stage 3 (moderate): Secondary | ICD-10-CM | POA: Diagnosis not present

## 2018-07-29 DIAGNOSIS — F329 Major depressive disorder, single episode, unspecified: Secondary | ICD-10-CM | POA: Diagnosis not present

## 2018-07-30 DIAGNOSIS — H401131 Primary open-angle glaucoma, bilateral, mild stage: Secondary | ICD-10-CM | POA: Diagnosis not present

## 2018-07-30 DIAGNOSIS — Z961 Presence of intraocular lens: Secondary | ICD-10-CM | POA: Diagnosis not present

## 2018-07-30 DIAGNOSIS — E119 Type 2 diabetes mellitus without complications: Secondary | ICD-10-CM | POA: Diagnosis not present

## 2018-07-30 DIAGNOSIS — E113293 Type 2 diabetes mellitus with mild nonproliferative diabetic retinopathy without macular edema, bilateral: Secondary | ICD-10-CM | POA: Diagnosis not present

## 2018-08-01 DIAGNOSIS — R262 Difficulty in walking, not elsewhere classified: Secondary | ICD-10-CM | POA: Diagnosis not present

## 2018-08-01 DIAGNOSIS — R2681 Unsteadiness on feet: Secondary | ICD-10-CM | POA: Diagnosis not present

## 2018-08-01 DIAGNOSIS — G629 Polyneuropathy, unspecified: Secondary | ICD-10-CM | POA: Diagnosis not present

## 2018-08-01 DIAGNOSIS — R531 Weakness: Secondary | ICD-10-CM | POA: Diagnosis not present

## 2018-08-01 DIAGNOSIS — M545 Low back pain: Secondary | ICD-10-CM | POA: Diagnosis not present

## 2018-08-07 DIAGNOSIS — I25118 Atherosclerotic heart disease of native coronary artery with other forms of angina pectoris: Secondary | ICD-10-CM | POA: Diagnosis not present

## 2018-08-07 DIAGNOSIS — I739 Peripheral vascular disease, unspecified: Secondary | ICD-10-CM | POA: Diagnosis not present

## 2018-08-07 DIAGNOSIS — E7800 Pure hypercholesterolemia, unspecified: Secondary | ICD-10-CM | POA: Diagnosis not present

## 2018-08-07 DIAGNOSIS — I10 Essential (primary) hypertension: Secondary | ICD-10-CM | POA: Diagnosis not present

## 2018-08-08 DIAGNOSIS — R531 Weakness: Secondary | ICD-10-CM | POA: Diagnosis not present

## 2018-08-08 DIAGNOSIS — R262 Difficulty in walking, not elsewhere classified: Secondary | ICD-10-CM | POA: Diagnosis not present

## 2018-08-08 DIAGNOSIS — M545 Low back pain: Secondary | ICD-10-CM | POA: Diagnosis not present

## 2018-08-08 DIAGNOSIS — R2681 Unsteadiness on feet: Secondary | ICD-10-CM | POA: Diagnosis not present

## 2018-08-08 DIAGNOSIS — G629 Polyneuropathy, unspecified: Secondary | ICD-10-CM | POA: Diagnosis not present

## 2018-08-15 DIAGNOSIS — R2681 Unsteadiness on feet: Secondary | ICD-10-CM | POA: Diagnosis not present

## 2018-08-15 DIAGNOSIS — R262 Difficulty in walking, not elsewhere classified: Secondary | ICD-10-CM | POA: Diagnosis not present

## 2018-08-15 DIAGNOSIS — M545 Low back pain: Secondary | ICD-10-CM | POA: Diagnosis not present

## 2018-08-15 DIAGNOSIS — R531 Weakness: Secondary | ICD-10-CM | POA: Diagnosis not present

## 2018-08-15 DIAGNOSIS — G629 Polyneuropathy, unspecified: Secondary | ICD-10-CM | POA: Diagnosis not present

## 2018-08-21 DIAGNOSIS — I10 Essential (primary) hypertension: Secondary | ICD-10-CM | POA: Diagnosis not present

## 2018-08-21 DIAGNOSIS — R7989 Other specified abnormal findings of blood chemistry: Secondary | ICD-10-CM | POA: Diagnosis not present

## 2018-08-21 DIAGNOSIS — N183 Chronic kidney disease, stage 3 (moderate): Secondary | ICD-10-CM | POA: Diagnosis not present

## 2018-08-21 DIAGNOSIS — E789 Disorder of lipoprotein metabolism, unspecified: Secondary | ICD-10-CM | POA: Diagnosis not present

## 2018-08-21 DIAGNOSIS — E118 Type 2 diabetes mellitus with unspecified complications: Secondary | ICD-10-CM | POA: Diagnosis not present

## 2018-08-30 DIAGNOSIS — R2681 Unsteadiness on feet: Secondary | ICD-10-CM | POA: Diagnosis not present

## 2018-08-30 DIAGNOSIS — R531 Weakness: Secondary | ICD-10-CM | POA: Diagnosis not present

## 2018-08-30 DIAGNOSIS — M545 Low back pain: Secondary | ICD-10-CM | POA: Diagnosis not present

## 2018-08-30 DIAGNOSIS — R262 Difficulty in walking, not elsewhere classified: Secondary | ICD-10-CM | POA: Diagnosis not present

## 2018-08-30 DIAGNOSIS — G629 Polyneuropathy, unspecified: Secondary | ICD-10-CM | POA: Diagnosis not present

## 2018-09-02 DIAGNOSIS — I739 Peripheral vascular disease, unspecified: Secondary | ICD-10-CM | POA: Diagnosis not present

## 2018-09-02 DIAGNOSIS — Z136 Encounter for screening for cardiovascular disorders: Secondary | ICD-10-CM | POA: Diagnosis not present

## 2018-09-06 DIAGNOSIS — R531 Weakness: Secondary | ICD-10-CM | POA: Diagnosis not present

## 2018-09-06 DIAGNOSIS — R262 Difficulty in walking, not elsewhere classified: Secondary | ICD-10-CM | POA: Diagnosis not present

## 2018-09-06 DIAGNOSIS — M545 Low back pain: Secondary | ICD-10-CM | POA: Diagnosis not present

## 2018-09-06 DIAGNOSIS — R2681 Unsteadiness on feet: Secondary | ICD-10-CM | POA: Diagnosis not present

## 2018-09-06 DIAGNOSIS — G629 Polyneuropathy, unspecified: Secondary | ICD-10-CM | POA: Diagnosis not present

## 2018-09-10 DIAGNOSIS — M545 Low back pain: Secondary | ICD-10-CM | POA: Diagnosis not present

## 2018-09-10 DIAGNOSIS — R2681 Unsteadiness on feet: Secondary | ICD-10-CM | POA: Diagnosis not present

## 2018-09-10 DIAGNOSIS — R262 Difficulty in walking, not elsewhere classified: Secondary | ICD-10-CM | POA: Diagnosis not present

## 2018-09-10 DIAGNOSIS — G629 Polyneuropathy, unspecified: Secondary | ICD-10-CM | POA: Diagnosis not present

## 2018-09-10 DIAGNOSIS — R531 Weakness: Secondary | ICD-10-CM | POA: Diagnosis not present

## 2018-09-12 DIAGNOSIS — R2681 Unsteadiness on feet: Secondary | ICD-10-CM | POA: Diagnosis not present

## 2018-09-12 DIAGNOSIS — R531 Weakness: Secondary | ICD-10-CM | POA: Diagnosis not present

## 2018-09-12 DIAGNOSIS — R262 Difficulty in walking, not elsewhere classified: Secondary | ICD-10-CM | POA: Diagnosis not present

## 2018-09-12 DIAGNOSIS — G629 Polyneuropathy, unspecified: Secondary | ICD-10-CM | POA: Diagnosis not present

## 2018-09-12 DIAGNOSIS — M545 Low back pain: Secondary | ICD-10-CM | POA: Diagnosis not present

## 2018-09-13 DIAGNOSIS — I25118 Atherosclerotic heart disease of native coronary artery with other forms of angina pectoris: Secondary | ICD-10-CM | POA: Diagnosis not present

## 2018-09-13 DIAGNOSIS — I10 Essential (primary) hypertension: Secondary | ICD-10-CM | POA: Diagnosis not present

## 2018-09-13 DIAGNOSIS — E7800 Pure hypercholesterolemia, unspecified: Secondary | ICD-10-CM | POA: Diagnosis not present

## 2018-09-13 DIAGNOSIS — I739 Peripheral vascular disease, unspecified: Secondary | ICD-10-CM | POA: Diagnosis not present

## 2018-09-20 DIAGNOSIS — R531 Weakness: Secondary | ICD-10-CM | POA: Diagnosis not present

## 2018-09-20 DIAGNOSIS — G629 Polyneuropathy, unspecified: Secondary | ICD-10-CM | POA: Diagnosis not present

## 2018-09-20 DIAGNOSIS — R2681 Unsteadiness on feet: Secondary | ICD-10-CM | POA: Diagnosis not present

## 2018-09-20 DIAGNOSIS — R262 Difficulty in walking, not elsewhere classified: Secondary | ICD-10-CM | POA: Diagnosis not present

## 2018-09-20 DIAGNOSIS — M545 Low back pain: Secondary | ICD-10-CM | POA: Diagnosis not present

## 2018-09-26 DIAGNOSIS — G629 Polyneuropathy, unspecified: Secondary | ICD-10-CM | POA: Diagnosis not present

## 2018-09-26 DIAGNOSIS — R262 Difficulty in walking, not elsewhere classified: Secondary | ICD-10-CM | POA: Diagnosis not present

## 2018-09-26 DIAGNOSIS — R2681 Unsteadiness on feet: Secondary | ICD-10-CM | POA: Diagnosis not present

## 2018-09-26 DIAGNOSIS — R531 Weakness: Secondary | ICD-10-CM | POA: Diagnosis not present

## 2018-09-26 DIAGNOSIS — M545 Low back pain: Secondary | ICD-10-CM | POA: Diagnosis not present

## 2018-09-30 DIAGNOSIS — I739 Peripheral vascular disease, unspecified: Secondary | ICD-10-CM | POA: Diagnosis present

## 2018-09-30 NOTE — H&P (Signed)
OFFICE VISIT NOTES COPIED TO EPIC FOR DOCUMENTATION  . History of Present Illness Mary Maine FNP-C; 09/13/2018 11:35 AM) Patient words: f/u for duplex; Last OV 09/02/2018.  The patient is a 84 year old female who presents for a Follow-up for Angina. Mary Harding is a AA female with known coronary artery disease by angiography in 2015 which had revealed a distal small OM1 stenosis, otherwise no significant CAD, was recommended medical therapy, has had chronic stable angina, hypertension, former tobacco use, diabetes mellitus with stage III chronic kidney disease and hyperlipidemia. She underwent Lexiscan nuclear stress test in June 2019 and was considered low risk study.  Patient presents to discuss lower extremity duplex results. Due to symptoms of claudication, hyperlipidemia that was markedly abnormal. I had started him on Pletal and switched simvastatin to Crestor. During the interim, did have some symptoms of lightheadedness taking Pletal with her other medications. She has since been taking Pletal first thing in the morning and then a few hours later taking her medications has not had any further episodes. She's had improvement in claudication symptoms particularly on the left, but does continue to have activity limiting pain on the right. Also mentions that she fell in May and sustained to wounds around her right ankle that are still present, but slowly healing.   Problem List/Past Medical (April Harrington; 09/13/2018 9:57 AM) GERD (gastroesophageal reflux disease) (K21.9)  Arthritis (M19.90)  has 4 ruptured disc in back Chest pain, atypical (R07.89)  EKG 12/18/2012: Normal sinus rhythm at rate of 78 bpm, possible left atrial abnormality. Poor R-wave progression, probably normal variant. No evidence of ischemia. Benign essential HTN (I10)  Shortness of breath (R06.02)  Chest x-ray 11/12/2013: Stable chest x-ray no active lung disease. Diabetes mellitus with stage 3 chronic  kidney disease (E11.22)  Labs 12/21/2014: BUN 13, serum creatinine 1.3. EGFR 42 mL. CMP otherwise normal. No change from Labs 12/21/2014, 09/01/2014 Hyperlipidemia, group A (E78.00)  Insulin dependent type 2 diabetes mellitus, uncontrolled (E11.65)  Labs 12/21/2014: HbA1c 8.1%. HB 11.2/HCT 35.4. TSH normal. Angina pectoris (I20.9)  Laboratory examination (Z01.89)  05/22/2018: CBC normal. Cholesterol 237, triglycerides 314, HDL 35, LDL 139. 03/28/2018: RBC 3.5, hemoglobin 10.5, hematocrit 33, MCHC 31.8, CBC otherwise normal. Glucose 119, creatinine 1.7, EGFR 29/36, potassium 3.7, CMP normal. Claudication (I73.9)  ABI 07/17/2018: This exam reveals moderately decreased perfusion of both the lower extremities, noted at the post tibial artery level (ABI 0.58) with severely abnormal waveforms at both ankles. Consider further work-up. Coronary artery disease of native artery of native heart with stable angina pectoris (I25.118) [04/09/2014]: Lexiscan Sestamibi stress test 05/20/2018: 1. Lexiscan stress test was performed. Exercise capacity was not assessed. Stress symptoms included dyspnea, heartburn, headache. Peak blood pressure was 156/78 mmHg. The resting electrocardiogram demonstrated normal sinus rhythm, normal resting conduction, no resting arrhythmias and normal rest repolarization. Stress EKG is non diagnostic for ischemia as it is a pharmacologic stress. 2. The overall quality of the study is good. There is no evidence of abnormal lung activity. Stress and rest SPECT images demonstrate homogeneous tracer distribution throughout the myocardium. Gated SPECT imaging reveals normal myocardial thickening and wall motion. The left ventricular ejection fraction was normal (69%). 3. Low risk study. Coronary angiogram 04/09/2014: Hyperdynamic left ventricle. Mild diffuse noncritical coronary artery disease, Distal OM1 showing a 70-80% hazy stenosis, plaque. Recommended medical therapy only. Echo- 12/01/13 1. Left  ventricular internal dimension is decreased. Mild to moderate concentric hypertrophy. Normal global wall motion. Normal systolic global function. Calculated  EF 55%. Doppler evidence of Grade I (impaired) diastolic dysfunction with elevated LV filling pressure. 2. Left atrial cavity is slightly dilated. Mild aneurysmal motion of the interatrial septum. Lexiscan Sestamibi Stress 11/21/2013: 1. Resting EKG NSR, Poor R wave progression. Non specific ST-Tchange, cannot r/o lateral ischemia. Stress EKG was non diagnostic for ischemia. 2. The perfusion study demonstrated mild inferior wall soft tissue attenuation artifact. There was no evidence of ischemia or scar. Dynamic gated images reveal normal wall motion and endocardial thickening. Left ventricular ejection fraction was estimated to be 59%. This is a low risk study.  Allergies (April Harrington; Sep 25, 2018 9:57 AM) No Known Drug Allergies [11/17/2013]:  Family History (April Harrington; 2018/09/25 9:57 AM) Mother  Deceased. at age 70 diabetic, brain tumor, alzheimers Father  Deceased. at age 29 from an accident, no known heart issues Siblings  6, no known heart issues 1 sister died of cancer  Social History (April Harrington; 09-25-2018 9:57 AM) Current tobacco use  Former smoker. quit about 10 years ago Non Drinker/No Alcohol Use  Marital status  Widowed. Number of Children  6. Living Situation  Lives alone.  Past Surgical History (April Harrington; 09/25/18 9:57 AM) node removed from vocal cord [1974]: Cataract Extraction-Bilateral [2013]: had 2 code blue Laser Surgery [02/2014]: Right.  Medication History (April Harrington; 09-25-18 10:11 AM) Carvedilol (12.'5MG'$  Tablet, 1 (one) Tablet Tablet Tablet T Oral two times daily, Taken starting 04/25/2018) Active. (increased from 6.'25mg'$  on 06/15/14) Mirtazapine (7.'5MG'$  Tablet, 1 Oral daily) Active. Potassium Chloride Crys ER (10MEQ Tablet ER, 1 Oral daily) Active. Lantus (100UNIT/ML  Solution, '27mg'$  Subcutaneous daily) Active. traMADol HCl ('10MG'$ /ML For Suspension, 1 Oral q6h) Active. Victoza ('18MG'$ /3ML Soln Pen-inj, 1.8 Subcutaneous daily) Active. Protonix ('40MG'$  Tablet DR, 1 Oral daily) Active. NovoLOG FlexPen (100UNIT/ML Soln Pen-inj, sliding scale Subcutaneous daily) Active. Furosemide ('20MG'$  Tablet, 1 Oral daily) Active. Clopidogrel Bisulfate ('75MG'$  Tablet, 1 Oral daily) Active. Nitrostat (0.'4MG'$  Tab Sublingual, dissolve 1 tab under tongue Sublingual as needed for chest pain) Active. Multivitamins (1 Oral daily) Active. Betamethasone Dipropionate Aug (0.05% Ointment, External two times daily) Active. Sertraline HCl ('50MG'$  Tablet, 1 Oral daily) Active. Valsartan ('320MG'$  Tablet, 1 Oral daily) Active. amLODIPine Besylate ('10MG'$  Tablet, 1 (one) Tablet Tablet Tablet T Oral daily, Taken starting 04/25/2018) Active. Cilostazol ('100MG'$  Tablet, 1 (one) Tablet Oral two times daily, Taken starting 08/09/2018) Active. Rosuvastatin Calcium ('10MG'$  Tablet, 1 Tablet Oral daily, Taken starting 08/09/2018) Active. Aspirin Adult Low Strength ('81MG'$  Tablet DR, 1 (one) Tablet DR Tablet DR Oral daily, Taken starting 06/11/2018) Active. EpiPen 2-Pak (0.'3MG'$ /0.3ML Soln Auto-inj, 1 Injection as needed) Active. Glimepiride ('1MG'$  Tablet, 1 Oral daily) Active. Iron (325 (65 Fe)MG Tablet, 1 Oral two times daily) Active. buPROPion HCl ER (XL) ('150MG'$  Tablet ER 24HR, 1 Oral daily) Active. Medications Reconciled (verbally with pt; list present)  Diagnostic Studies History (April Harrington; 09/25/18 10:03 AM) Lower Extremity Dopplers  09/02/2018: Right SFA from proximal to mid appears to be near occlusion with severely abnormal monophasic waveform. Significant velocity increase at the left mid superficial femoral artery suggests >50% stenosis. Left SFA promimal to distal is monophasic waveform suggests near occlusion. This exam reveals moderately decreased perfusion of the lower extremity,  noted at the post tibial artery level (RABI 0.54 and LABI 0.70). Consider further work-up. Coronary Angiogram [04/09/2014]: 04/09/2014: Hyperdynamic left ventricle. Mild diffuse noncritical coronary artery disease, Distal OM1 showing a 70-80% hazy stenosis, ruptured lack, appeared well-healed. Recommended medical therapy only. Treadmill stress test [2012]: Nuclear stress test  05/20/2018: 1. Lexiscan stress test  was performed. Exercise capacity was not assessed. Stress symptoms included dyspnea, heartburn, headache. Peak blood pressure was 156/78 mmHg. The resting electrocardiogram demonstrated normal sinus rhythm, normal resting conduction, no resting arrhythmias and normal rest repolarization. Stress EKG is non diagnostic for ischemia as it is a pharmacologic stress. 2. The overall quality of the study is good. There is no evidence of abnormal lung activity. Stress and rest SPECT images demonstrate homogeneous tracer distribution throughout the myocardium. Gated SPECT imaging reveals normal myocardial thickening and wall motion. The left ventricular ejection fraction was normal (69%). 3. Low risk study. Echocardiogram  Echo- 12/01/13 1. Left ventricular internal dimension is decreased. Mild to moderate concentric hypertrophy. Normal global wall motion. Normal systolic global function. Calculated EF 55%. Doppler evidence of Grade I (impaired) diastolic dysfunction with elevated LV filling pressure. 2. Left atrial cavity is slightly dilated. Mild aneurysmal motion of the interatrial septum. 3. Mitral valve structurally normal. Mild calcification of the mitral annulus Trace mitral regurgitation. Mitral valve inflow A > E ratio. 4. Tricuspid valve structurally normal. Trace tricuspid regurgitation.    Review of Systems Mary Maine, FNP-C; 09/13/2018 11:35 AM) General Not Present- Fatigue, Fever and Night Sweats. Skin Not Present- Itching and Rash. HEENT Not Present- Headache. Respiratory  Present- Decreased Exercise Tolerance. Not Present- Bloody sputum, Hemoptysis and Wakes up from Sleep Wheezing or Short of Breath. Cardiovascular Present- Chest Pain, Difficulty Breathing Lying Down (stable) and Leg Cramps. Not Present- Fainting, Orthopnea and Swelling of Extremities. Gastrointestinal Present- Diarrhea. Not Present- Abdominal Pain, Constipation, Nausea and Vomiting. Musculoskeletal Present- Back Pain, Backache and Joint Pain (bilateral knee). Neurological Not Present- Headaches. Hematology Not Present- Blood Clots, Easy Bruising and Nose Bleed.  Vitals (April Harrington; 09/13/2018 10:12 AM) 09/13/2018 10:01 AM Weight: 170.38 lb Height: 65in Body Surface Area: 1.85 m Body Mass Index: 28.35 kg/m  Pulse: 91 (Regular)  P.OX: 98% (Room air) BP: 109/69 (Sitting, Left Arm, Standard)       Physical Exam Mary Maine, FNP-C; 09/13/2018 11:35 AM) General Mental Status-Alert. General Appearance-Cooperative, Appears stated age, Not in acute distress. Orientation-Oriented X3. Build & Nutrition-Well nourished and Moderately built.  Head and Neck Thyroid Gland Characteristics - no palpable nodules, no palpable enlargement.  Chest and Lung Exam Chest and lung exam reveals -quiet, even and easy respiratory effort with no use of accessory muscles. Palpation Tender - No chest wall tenderness.  Cardiovascular Cardiovascular examination reveals -carotid auscultation reveals no bruits. Inspection Jugular vein - Right - No Distention. Auscultation Heart Sounds - S4.  Abdomen Inspection Contour - Obese. Palpation/Percussion Normal exam - Non Tender and No hepatosplenomegaly. Auscultation Normal exam - Bowel sounds normal.  Peripheral Vascular Lower Extremity Inspection - Left - No Pigmentation, No Varicose veins. Right - No Pigmentation, No Varicose veins. Palpation - Edema - Left - No edema. Right - No edema. Femoral pulse - Bilateral -  Feeble(Pulsus difficult to feel due to patient's bodily habitus.). Popliteal pulse - Bilateral - Feeble(Pulsus difficult to feel due to patient's bodily habitus.). Dorsalis pedis pulse - Bilateral - 1+. Posterior tibial pulse - Bilateral - 1+. Carotid arteries - Left-No Carotid bruit. Carotid arteries - Right-No Carotid bruit. Abdomen-No prominent abdominal aortic pulsation, No epigastric bruit.  Neurologic Motor-Grossly intact without any focal deficits.  Musculoskeletal Global Assessment Left Lower Extremity - normal range of motion without pain. Right Lower Extremity - normal range of motion without pain.   Results Mary Maine FNP-C; 09/13/2018 11:35 AM) Procedures  Name Value Date Complete duplex ultrasound  of arteries of lower extremity (14970) Comments: Lower extremity arterial duplex 09/02/2018: Right SFA from proximal to mid appears to be near occlusion with severely abnormal monophasic waveform. Significant velocity increase at the left mid superficial femoral artery suggests >50% stenosis. Left SFA promimal to distal is monophasic waveform suggests near occlusion. This exam reveals moderately decreased perfusion of the lower extremity, noted at the post tibial artery level (RABI 0.54 and LABI 0.70). Consider further work-up.  Performed: 09/02/2018 7:31 PM  Assessment & Plan (Devonna Shumate; 09/13/2018 10:36 AM) PVD (peripheral vascular disease) with claudication (I73.9) Story: ABI 07/17/2018: This exam reveals moderately decreased perfusion of both the lower extremities, noted at the post tibial artery level (ABI 0.58) with severely abnormal waveforms at both ankles. Consider further work-up. Impression: Lower extremity arterial duplex 09/02/2018: Right SFA from proximal to mid appears to be near occlusion with severely abnormal monophasic waveform. Significant velocity increase at the left mid superficial femoral artery suggests >50% stenosis. Left SFA  promimal to distal is monophasic waveform suggests near occlusion. This exam reveals moderately decreased perfusion of the lower extremity, noted at the post tibial artery level (RABI 0.54 and LABI 0.70). Consider further work-up. Coronary artery disease of native artery of native heart with stable angina pectoris (I25.118) Story: Lexiscan Sestamibi stress test 05/20/2018: 1. Lexiscan stress test was performed. Exercise capacity was not assessed. Stress symptoms included dyspnea, heartburn, headache. Peak blood pressure was 156/78 mmHg. The resting electrocardiogram demonstrated normal sinus rhythm, normal resting conduction, no resting arrhythmias and normal rest repolarization. Stress EKG is non diagnostic for ischemia as it is a pharmacologic stress. 2. The overall quality of the study is good. There is no evidence of abnormal lung activity. Stress and rest SPECT images demonstrate homogeneous tracer distribution throughout the myocardium. Gated SPECT imaging reveals normal myocardial thickening and wall motion. The left ventricular ejection fraction was normal (69%). 3. Low risk study.  Coronary angiogram 04/09/2014: Hyperdynamic left ventricle. Mild diffuse noncritical coronary artery disease, Distal OM1 showing a 70-80% hazy stenosis, plaque. Recommended medical therapy only.  Hyperlipidemia, group A (E78.00) Benign essential HTN (I10) Laboratory examination (Z01.89) 09/16/2018: Cholesterol 143, triglycerides 1.1, HDL 44, LDL 71.  Creatinine 2.2, EGFR 23, potassium 3.9, CMP otherwise normal.  RBC 3.39, hematocrit 33.5, MCV 99, CBC otherwise normal.  05/22/2018: CBC normal. Cholesterol 237, triglycerides 314, HDL 35, LDL 139.  03/28/2018: RBC 3.5, hemoglobin 10.5, hematocrit 33, MCHC 31.8, CBC otherwise normal. Glucose 119, creatinine 1.7, EGFR 29/36, potassium 3.7, CMP normal.  Note:-  Recommendations:  Mary Harding is a AA female with known coronary artery disease by angiography in  2015 which had revealed a distal small OM1 stenosis, otherwise no significant CAD, was recommended medical therapy, has had chronic stable angina, hypertension, diabetes mellitus with stage III chronic kidney disease and hyperlipidemia.   Patient presents for follow-up for claudication symptoms and discussed lower extremity duplex results. As markedly abnormal lower extremity duplex with right ABI of 0.5 and suggestive of near occlusion of the right SFA and and almost near occlusion of left SFA with left ABI of 0.7. At her last office visit, I have started her on Pletal and has had improvement in claudication symptoms in the left leg, but does continue to have right leg pain with activity. She also fell in May and had ulcerations that were slow to heal. Still present today, but are slowly improving. Vascular exam remains unchanged. Needs to schedule for peripheral arteriogram and possible angioplasty given symptoms. Patient understands the  risks, benefits, alternatives including medical therapy, CT angiography. Patient understands <1-2% risk of death, embolic complications, bleeding, infection, renal failure, urgent surgical revascularization, but not limited to these and wants to proceed. In view of her symptoms, lower extremity duplex findings, we'll set her up for right PV angiogram for further evaluation. She may need further evaluation of her left leg at some point, but hopefully can continue with medical management. I'll see her back after the procedure for further recommendations and reevaluation.   CC: Dr. Deland Pretty.  Signed by Mary Maine, FNP-C (09/13/2018 11:36 AM)

## 2018-10-01 ENCOUNTER — Ambulatory Visit (HOSPITAL_COMMUNITY)
Admission: RE | Admit: 2018-10-01 | Discharge: 2018-10-02 | Disposition: A | Discharge status: Home / Self Care | Payer: Medicare Other | Source: Ambulatory Visit | Attending: Cardiology | Admitting: Cardiology

## 2018-10-01 ENCOUNTER — Encounter (HOSPITAL_COMMUNITY): Payer: Self-pay | Admitting: General Practice

## 2018-10-01 ENCOUNTER — Other Ambulatory Visit: Payer: Self-pay

## 2018-10-01 ENCOUNTER — Ambulatory Visit (HOSPITAL_COMMUNITY)
Admission: RE | Disposition: A | Discharge status: Home / Self Care | Payer: Self-pay | Source: Ambulatory Visit | Attending: Cardiology

## 2018-10-01 DIAGNOSIS — M1990 Unspecified osteoarthritis, unspecified site: Secondary | ICD-10-CM | POA: Diagnosis not present

## 2018-10-01 DIAGNOSIS — E7800 Pure hypercholesterolemia, unspecified: Secondary | ICD-10-CM | POA: Insufficient documentation

## 2018-10-01 DIAGNOSIS — Z9889 Other specified postprocedural states: Secondary | ICD-10-CM | POA: Insufficient documentation

## 2018-10-01 DIAGNOSIS — E1165 Type 2 diabetes mellitus with hyperglycemia: Secondary | ICD-10-CM | POA: Diagnosis not present

## 2018-10-01 DIAGNOSIS — Z7982 Long term (current) use of aspirin: Secondary | ICD-10-CM | POA: Diagnosis not present

## 2018-10-01 DIAGNOSIS — Z79899 Other long term (current) drug therapy: Secondary | ICD-10-CM | POA: Insufficient documentation

## 2018-10-01 DIAGNOSIS — E1122 Type 2 diabetes mellitus with diabetic chronic kidney disease: Secondary | ICD-10-CM | POA: Diagnosis not present

## 2018-10-01 DIAGNOSIS — I129 Hypertensive chronic kidney disease with stage 1 through stage 4 chronic kidney disease, or unspecified chronic kidney disease: Secondary | ICD-10-CM | POA: Insufficient documentation

## 2018-10-01 DIAGNOSIS — Z9842 Cataract extraction status, left eye: Secondary | ICD-10-CM | POA: Diagnosis not present

## 2018-10-01 DIAGNOSIS — N183 Chronic kidney disease, stage 3 (moderate): Secondary | ICD-10-CM | POA: Insufficient documentation

## 2018-10-01 DIAGNOSIS — Z9841 Cataract extraction status, right eye: Secondary | ICD-10-CM | POA: Diagnosis not present

## 2018-10-01 DIAGNOSIS — Z833 Family history of diabetes mellitus: Secondary | ICD-10-CM | POA: Diagnosis not present

## 2018-10-01 DIAGNOSIS — Z87891 Personal history of nicotine dependence: Secondary | ICD-10-CM | POA: Diagnosis not present

## 2018-10-01 DIAGNOSIS — Z23 Encounter for immunization: Secondary | ICD-10-CM | POA: Diagnosis not present

## 2018-10-01 DIAGNOSIS — I25118 Atherosclerotic heart disease of native coronary artery with other forms of angina pectoris: Secondary | ICD-10-CM | POA: Diagnosis not present

## 2018-10-01 DIAGNOSIS — Z794 Long term (current) use of insulin: Secondary | ICD-10-CM | POA: Diagnosis not present

## 2018-10-01 DIAGNOSIS — I70211 Atherosclerosis of native arteries of extremities with intermittent claudication, right leg: Secondary | ICD-10-CM | POA: Insufficient documentation

## 2018-10-01 DIAGNOSIS — K219 Gastro-esophageal reflux disease without esophagitis: Secondary | ICD-10-CM | POA: Diagnosis not present

## 2018-10-01 DIAGNOSIS — I739 Peripheral vascular disease, unspecified: Secondary | ICD-10-CM | POA: Diagnosis present

## 2018-10-01 HISTORY — DX: Cardiac murmur, unspecified: R01.1

## 2018-10-01 HISTORY — DX: Chronic kidney disease, stage 4 (severe): N18.4

## 2018-10-01 HISTORY — PX: PERIPHERAL VASCULAR ATHERECTOMY: CATH118256

## 2018-10-01 HISTORY — DX: Low back pain: M54.5

## 2018-10-01 HISTORY — DX: Other chronic pain: G89.29

## 2018-10-01 HISTORY — DX: Anxiety disorder, unspecified: F41.9

## 2018-10-01 HISTORY — DX: Depression, unspecified: F32.A

## 2018-10-01 HISTORY — DX: Atherosclerotic heart disease of native coronary artery without angina pectoris: I25.10

## 2018-10-01 HISTORY — DX: Type 2 diabetes mellitus without complications: E11.9

## 2018-10-01 HISTORY — DX: Major depressive disorder, single episode, unspecified: F32.9

## 2018-10-01 HISTORY — DX: Family history of other specified conditions: Z84.89

## 2018-10-01 HISTORY — PX: LOWER EXTREMITY ANGIOGRAPHY: CATH118251

## 2018-10-01 HISTORY — PX: ABDOMINAL AORTOGRAM: CATH118222

## 2018-10-01 LAB — POCT ACTIVATED CLOTTING TIME: ACTIVATED CLOTTING TIME: 345 s

## 2018-10-01 LAB — GLUCOSE, CAPILLARY
GLUCOSE-CAPILLARY: 146 mg/dL — AB (ref 70–99)
Glucose-Capillary: 108 mg/dL — ABNORMAL HIGH (ref 70–99)
Glucose-Capillary: 237 mg/dL — ABNORMAL HIGH (ref 70–99)
Glucose-Capillary: 216 mg/dL — ABNORMAL HIGH (ref 70–99)
Glucose-Capillary: 230 mg/dL — ABNORMAL HIGH (ref 70–99)

## 2018-10-01 SURGERY — LOWER EXTREMITY ANGIOGRAPHY
Anesthesia: LOCAL | Laterality: Right

## 2018-10-01 MED ORDER — HYDROCODONE-ACETAMINOPHEN 5-325 MG PO TABS
2.0000 | ORAL_TABLET | Freq: Once | ORAL | Status: AC
  Administered 2018-10-01: 2 via ORAL
  Filled 2018-10-01: qty 2

## 2018-10-01 MED ORDER — SODIUM CHLORIDE 0.9 % IV BOLUS
500.0000 mL | Freq: Once | INTRAVENOUS | Status: AC
  Administered 2018-10-01: 500 mL via INTRAVENOUS

## 2018-10-01 MED ORDER — FAMOTIDINE IN NACL 20-0.9 MG/50ML-% IV SOLN
INTRAVENOUS | Status: AC
  Filled 2018-10-01: qty 50

## 2018-10-01 MED ORDER — CLOPIDOGREL BISULFATE 300 MG PO TABS
ORAL_TABLET | ORAL | Status: AC
  Filled 2018-10-01: qty 1

## 2018-10-01 MED ORDER — CLOPIDOGREL BISULFATE 75 MG PO TABS
75.0000 mg | ORAL_TABLET | Freq: Every day | ORAL | Status: DC
  Administered 2018-10-02: 75 mg via ORAL
  Filled 2018-10-01: qty 1

## 2018-10-01 MED ORDER — SODIUM CHLORIDE 0.9 % IV SOLN: 250.0000 mL | INTRAVENOUS | Status: DC | PRN

## 2018-10-01 MED ORDER — SODIUM CHLORIDE 0.9 % IV SOLN: INTRAVENOUS | Status: DC

## 2018-10-01 MED ORDER — MIDAZOLAM HCL 2 MG/2ML IJ SOLN
INTRAMUSCULAR | Status: DC | PRN
  Administered 2018-10-01: 2 mg via INTRAVENOUS
  Administered 2018-10-01: 1 mg via INTRAVENOUS

## 2018-10-01 MED ORDER — NITROGLYCERIN 1 MG/10 ML FOR IR/CATH LAB
INTRA_ARTERIAL | Status: AC
  Filled 2018-10-01: qty 10

## 2018-10-01 MED ORDER — CLOPIDOGREL BISULFATE 300 MG PO TABS
ORAL_TABLET | ORAL | Status: DC | PRN
  Administered 2018-10-01: 300 mg via ORAL

## 2018-10-01 MED ORDER — CLOPIDOGREL BISULFATE 75 MG PO TABS
75.0000 mg | ORAL_TABLET | Freq: Once | ORAL | Status: AC
  Administered 2018-10-01: 75 mg via ORAL
  Filled 2018-10-01: qty 1

## 2018-10-01 MED ORDER — SODIUM CHLORIDE 0.9% FLUSH: 3.0000 mL | INTRAVENOUS | Status: DC | PRN

## 2018-10-01 MED ORDER — INSULIN GLARGINE 100 UNIT/ML ~~LOC~~ SOLN
27.0000 [IU] | Freq: Every day | SUBCUTANEOUS | Status: DC
  Administered 2018-10-01: 10 [IU] via SUBCUTANEOUS
  Filled 2018-10-01: qty 0.27

## 2018-10-01 MED ORDER — MIDAZOLAM HCL 2 MG/2ML IJ SOLN
INTRAMUSCULAR | Status: AC
  Filled 2018-10-01: qty 2

## 2018-10-01 MED ORDER — LABETALOL HCL 5 MG/ML IV SOLN: 10.0000 mg | INTRAVENOUS | Status: DC | PRN

## 2018-10-01 MED ORDER — SODIUM CHLORIDE 0.9% FLUSH: 3.0000 mL | Freq: Two times a day (BID) | INTRAVENOUS | Status: DC

## 2018-10-01 MED ORDER — LABETALOL HCL 5 MG/ML IV SOLN
INTRAVENOUS | Status: AC
  Filled 2018-10-01: qty 4

## 2018-10-01 MED ORDER — SODIUM CHLORIDE 0.9 % WEIGHT BASED INFUSION
1.0000 mL/kg/h | INTRAVENOUS | Status: AC
  Administered 2018-10-01: 1 mL/kg/h via INTRAVENOUS

## 2018-10-01 MED ORDER — ACETAMINOPHEN 325 MG PO TABS: 650.0000 mg | ORAL_TABLET | ORAL | Status: DC | PRN

## 2018-10-01 MED ORDER — TRAMADOL HCL 50 MG PO TABS: 50.0000 mg | ORAL_TABLET | Freq: Four times a day (QID) | ORAL | Status: DC | PRN

## 2018-10-01 MED ORDER — HYDRALAZINE HCL 20 MG/ML IJ SOLN: 5.0000 mg | INTRAMUSCULAR | Status: DC | PRN

## 2018-10-01 MED ORDER — IODIXANOL 320 MG/ML IV SOLN
INTRAVENOUS | Status: DC | PRN
  Administered 2018-10-01: 30 mL via INTRAVENOUS

## 2018-10-01 MED ORDER — FENTANYL CITRATE (PF) 100 MCG/2ML IJ SOLN
INTRAMUSCULAR | Status: DC | PRN
  Administered 2018-10-01: 50 ug via INTRAVENOUS
  Administered 2018-10-01: 25 ug via INTRAVENOUS
  Administered 2018-10-01: 50 ug via INTRAVENOUS

## 2018-10-01 MED ORDER — INSULIN ASPART 100 UNIT/ML ~~LOC~~ SOLN
0.0000 [IU] | Freq: Three times a day (TID) | SUBCUTANEOUS | Status: DC
  Administered 2018-10-01: 5 [IU] via SUBCUTANEOUS

## 2018-10-01 MED ORDER — ONDANSETRON HCL 4 MG/2ML IJ SOLN: 4.0000 mg | Freq: Four times a day (QID) | INTRAMUSCULAR | Status: DC | PRN

## 2018-10-01 MED ORDER — HEPARIN (PORCINE) IN NACL 1000-0.9 UT/500ML-% IV SOLN
INTRAVENOUS | Status: AC
  Filled 2018-10-01: qty 1000

## 2018-10-01 MED ORDER — INFLUENZA VAC SPLIT HIGH-DOSE 0.5 ML IM SUSY
0.5000 mL | PREFILLED_SYRINGE | INTRAMUSCULAR | Status: AC
  Administered 2018-10-02: 0.5 mL via INTRAMUSCULAR
  Filled 2018-10-01: qty 0.5

## 2018-10-01 MED ORDER — PANTOPRAZOLE SODIUM 40 MG PO TBEC
40.0000 mg | DELAYED_RELEASE_TABLET | Freq: Every day | ORAL | Status: DC
  Administered 2018-10-02: 40 mg via ORAL
  Filled 2018-10-01: qty 1

## 2018-10-01 MED ORDER — FENTANYL CITRATE (PF) 100 MCG/2ML IJ SOLN
INTRAMUSCULAR | Status: AC
  Filled 2018-10-01: qty 2

## 2018-10-01 MED ORDER — HEPARIN SODIUM (PORCINE) 1000 UNIT/ML IJ SOLN
INTRAMUSCULAR | Status: DC | PRN
  Administered 2018-10-01: 10000 [IU] via INTRAVENOUS

## 2018-10-01 MED ORDER — LIDOCAINE HCL (PF) 1 % IJ SOLN
INTRAMUSCULAR | Status: AC
  Filled 2018-10-01: qty 30

## 2018-10-01 MED ORDER — BUPROPION HCL ER (XL) 150 MG PO TB24
150.0000 mg | ORAL_TABLET | Freq: Every day | ORAL | Status: DC
  Administered 2018-10-02: 150 mg via ORAL
  Filled 2018-10-01: qty 1

## 2018-10-01 MED ORDER — LIDOCAINE HCL (PF) 1 % IJ SOLN
INTRAMUSCULAR | Status: DC | PRN
  Administered 2018-10-01: 15 mL

## 2018-10-01 MED ORDER — FERROUS SULFATE 325 (65 FE) MG PO TABS
325.0000 mg | ORAL_TABLET | Freq: Two times a day (BID) | ORAL | Status: DC
  Administered 2018-10-01 – 2018-10-02 (×2): 325 mg via ORAL
  Filled 2018-10-01 (×2): qty 1

## 2018-10-01 MED ORDER — ROSUVASTATIN CALCIUM 10 MG PO TABS
10.0000 mg | ORAL_TABLET | Freq: Every day | ORAL | Status: DC
  Administered 2018-10-02: 10 mg via ORAL
  Filled 2018-10-01: qty 1

## 2018-10-01 MED ORDER — INSULIN ASPART 100 UNIT/ML ~~LOC~~ SOLN: 10.0000 [IU] | Freq: Three times a day (TID) | SUBCUTANEOUS | Status: DC

## 2018-10-01 MED ORDER — LABETALOL HCL 5 MG/ML IV SOLN
INTRAVENOUS | Status: DC | PRN
  Administered 2018-10-01: 15 mL via INTRAVENOUS

## 2018-10-01 MED ORDER — FAMOTIDINE IN NACL 20-0.9 MG/50ML-% IV SOLN
INTRAVENOUS | Status: AC | PRN
  Administered 2018-10-01: 20 mg via INTRAVENOUS

## 2018-10-01 MED ORDER — CARVEDILOL 12.5 MG PO TABS
12.5000 mg | ORAL_TABLET | Freq: Two times a day (BID) | ORAL | Status: DC
  Administered 2018-10-01 – 2018-10-02 (×2): 12.5 mg via ORAL
  Filled 2018-10-01 (×2): qty 1

## 2018-10-01 SURGICAL SUPPLY — 28 items
BALLN IN.PACT DCB 5X150 (BALLOONS) ×4
CATH HAWKONE LX EXTENDED TIP (CATHETERS) ×8 IMPLANT
CATH OMNI FLUSH 5F 65CM (CATHETERS) ×4 IMPLANT
CATH TEMPO AQUA 5F 100CM (CATHETERS) ×4 IMPLANT
CLOSURE MYNX CONTROL 6F/7F (Vascular Products) ×4 IMPLANT
DCB IN.PACT 5X150 (BALLOONS) ×3 IMPLANT
DEVICE SPIDERFX EMB PROT 6MM (WIRE) ×4 IMPLANT
FILTER CO2 0.2 MICRON (VASCULAR PRODUCTS) ×4 IMPLANT
GUIDEWIRE ANGLED .035X150CM (WIRE) ×4 IMPLANT
KIT ENCORE 26 ADVANTAGE (KITS) ×4 IMPLANT
KIT MICROPUNCTURE NIT STIFF (SHEATH) ×4 IMPLANT
KIT PV (KITS) ×4 IMPLANT
RESERVOIR CO2 (VASCULAR PRODUCTS) ×4 IMPLANT
SET FLUSH CO2 (MISCELLANEOUS) ×4 IMPLANT
SHEATH HIGHFLEX ANSEL 7FR 55CM (SHEATH) ×4 IMPLANT
SHEATH PINNACLE 5F 10CM (SHEATH) ×4 IMPLANT
SHEATH PINNACLE 7F 10CM (SHEATH) ×4 IMPLANT
SHEATH PROBE COVER 6X72 (BAG) ×4 IMPLANT
SHIELD RADPAD SCOOP 12X17 (MISCELLANEOUS) ×4 IMPLANT
STOPCOCK MORSE 400PSI 3WAY (MISCELLANEOUS) ×4 IMPLANT
SYRINGE MEDRAD AVANTA MACH 7 (SYRINGE) ×4 IMPLANT
TAPE VIPERTRACK RADIOPAQ (MISCELLANEOUS) ×3 IMPLANT
TAPE VIPERTRACK RADIOPAQUE (MISCELLANEOUS) ×1
TRANSDUCER W/STOPCOCK (MISCELLANEOUS) ×4 IMPLANT
TRAY PV CATH (CUSTOM PROCEDURE TRAY) ×4 IMPLANT
TUBING CIL FLEX 10 FLL-RA (TUBING) ×4 IMPLANT
WIRE HITORQ VERSACORE ST 145CM (WIRE) ×4 IMPLANT
WIRE VERSACORE LOC 115CM (WIRE) ×8 IMPLANT

## 2018-10-01 NOTE — Progress Notes (Signed)
Hematoma present. Pressure held for 5 minutes. Site soft, unremarkable.

## 2018-10-01 NOTE — Progress Notes (Signed)
Left groin ecchymosis. No discernible hematoma at this point. Overnight observation. Discharge tomorrow.   Nigel Mormon, MD [facility] Pager: [phone] Office: [phone] If no answer Cell [phone]

## 2018-10-01 NOTE — Progress Notes (Signed)
Assumed care of patient from Crisoforo Oxford, RN. Site firm, bruising present.

## 2018-10-01 NOTE — Discharge Instructions (Signed)

## 2018-10-01 NOTE — Interval H&P Note (Signed)
History and Physical Interval Note:  10/01/2018 7:34 AM  Mary Harding  has presented today for surgery, with the diagnosis of pad  The various methods of treatment have been discussed with the patient and family. After consideration of risks, benefits and other options for treatment, the patient has consented to  Procedure(s): LOWER EXTREMITY ANGIOGRAPHY (Right) and possible angioplasty as a surgical intervention for class 3b claudication .  The patient's history has been reviewed, patient examined, no change in status, stable for surgery.  I have reviewed the patient's chart and labs.  Questions were answered to the patient's satisfaction.     Adrian Prows

## 2018-10-01 NOTE — Progress Notes (Signed)
Hematoma returned. Aprroximately 5 inches wide. Pressure held for 5 minutes. SIte now soft, bruising unchanged. Dr. Einar Gip notified.

## 2018-10-02 ENCOUNTER — Encounter (HOSPITAL_COMMUNITY): Payer: Self-pay | Admitting: Cardiology

## 2018-10-02 DIAGNOSIS — E1165 Type 2 diabetes mellitus with hyperglycemia: Secondary | ICD-10-CM | POA: Diagnosis not present

## 2018-10-02 DIAGNOSIS — N183 Chronic kidney disease, stage 3 (moderate): Secondary | ICD-10-CM | POA: Diagnosis not present

## 2018-10-02 DIAGNOSIS — I129 Hypertensive chronic kidney disease with stage 1 through stage 4 chronic kidney disease, or unspecified chronic kidney disease: Secondary | ICD-10-CM | POA: Diagnosis not present

## 2018-10-02 DIAGNOSIS — I70211 Atherosclerosis of native arteries of extremities with intermittent claudication, right leg: Secondary | ICD-10-CM | POA: Diagnosis not present

## 2018-10-02 DIAGNOSIS — Z23 Encounter for immunization: Secondary | ICD-10-CM | POA: Diagnosis not present

## 2018-10-02 DIAGNOSIS — E1122 Type 2 diabetes mellitus with diabetic chronic kidney disease: Secondary | ICD-10-CM | POA: Diagnosis not present

## 2018-10-02 LAB — CBC
HEMATOCRIT: 26.4 % — AB (ref 36.0–46.0)
Hemoglobin: 8.5 g/dL — ABNORMAL LOW (ref 12.0–15.0)
MCH: 32.4 pg (ref 26.0–34.0)
MCHC: 32.2 g/dL (ref 30.0–36.0)
MCV: 100.8 fL — AB (ref 80.0–100.0)
PLATELETS: UNDETERMINED 10*3/uL (ref 150–400)
RBC: 2.62 MIL/uL — ABNORMAL LOW (ref 3.87–5.11)
RDW: 14 % (ref 11.5–15.5)
WBC: 9.4 10*3/uL (ref 4.0–10.5)
nRBC: 0 % (ref 0.0–0.2)

## 2018-10-02 LAB — GLUCOSE, CAPILLARY: Glucose-Capillary: 123 mg/dL — ABNORMAL HIGH (ref 70–99)

## 2018-10-02 MED ORDER — ASPIRIN 81 MG PO TABS: 81.0000 mg | ORAL_TABLET | Freq: Every day | ORAL | Status: DC

## 2018-10-02 MED FILL — Nitroglycerin IV Soln 100 MCG/ML in D5W: INTRA_ARTERIAL | Qty: 10 | Status: AC

## 2018-10-02 NOTE — Discharge Summary (Signed)
Physician Discharge Summary  Patient ID: Mary Harding MRN: [id number] DOB/AGE: March 10, 1934 84 y.o.  Admit date: 10/01/2018 Discharge date: 10/02/2018  Primary Discharge Diagnosis 1.  Peripheral arterial disease with claudication S/P right SFA directional atherectomy followed by [facility] angioplasty 2.  Residual left SFA 70 to 80% mid stenosis  Secondary Discharge Diagnosis 3.  Mild to moderate single-vessel coronary artery disease involving distal OM1 70 to 80% stenosis, with stable angina pectoris 4.  Stage III chronic kidney disease due to hypertension 5.  Hyperlipidemia  Significant Diagnostic Studies: Peripheral arteriogram 10/01/2018: Angiographic data: Abdominal arteriogram reveals presence of 2 renal arteries on the right and one renal on the left, left renal artery ostium has severe calcification and 20 to 30% stenosis.  Diffuse disease of the right renal arteries.  Aortoiliac bifurcation is widely patent with moderate amount of calcification noted in the right common iliac artery.  There was a 40 mm pressure gradient across the right common iliac artery ostium.  Right SFA with distal runoff: High-grade stenosis noted in the proximal and mid segment constituting 99% stenosis.  Below the right knee there is two-vessel runoff in the form of peroneal and posterior tibial arteries.  Anterior tibial is occluded.  Left SFA with distal runoff: At the level of the adductor canal, there is diffuse disease of the left SFA with mild to moderate amount of calcification, there is a tandem 50 to 60% stenosis followed by a 70 to 80% stenosis in the mid segment.  Below the left knee there is two-vessel runoff, tibial vessels were not well visualized as we utilized CO2 for angiography.  Intervention data: PTA and directional atherectomy with HawkOne LX of the right proximal to mid SFA followed by [facility] angioplasty with 5.0 x 150 mm InPact Admiral balloon, 99% to 0%.  Hospital Course: Patient admitted  electively for peripheral arteriogram, due to renal insufficiency, CO2 was utilized to perform arteriogram.  She underwent successful angioplasty to her right SFA.  Left groin arterial access was closed with minx, however patient developed hematoma in the holding area that necessitated patient's admission to the hospital.  The following morning hematoma was felt to be stable, she was asymptomatic without any pain or discomfort, hence felt stable for discharge.  Although hemoglobin was noted to be low, felt that it was probably related to dilutional anemia due to excessive fluid that was given during preprocedural and postprocedural times due to contrast exposure in view of stage III-IV chronic kidney disease.  Recommendations on discharge:  Patient be discharged home, medical therapy for left SFA disease unless symptoms are worse.  She will obtain ABI postprocedure in the next 2 to 4 weeks.  30 mL contrast utilized.  CO2 angiography performed.  Discharge Exam: Blood pressure (!) 141/53, pulse 85, temperature 98.4 F (36.9 C), temperature source Oral, resp. rate 15, height 5\' 6"  (1.676 m), weight 76.4 kg, SpO2 99 %.   General: Alert and oriented, no acute distress. Cardiac exam S1 and S2 is normal, no gallop or murmur appreciated. Chest exam: Clear to auscultate, no chest wall tenderness. Abdomen: No obese abdomen, nontender.  Bowel sounds heard in all 4 quadrants. Extremities: Left groin small hematoma measuring about 3 to 4 cm, without any tenderness or bruit.  Ecchymosis noted. Vascular exam: Bilateral femorals 2-3+, right popliteal 2+, left popliteal faint, right PT 2+, left PT absent.  Right foot is warm, left foot is colder.  Capillary refill normal bilaterally.  Labs:   Lab Results  Component Value  Date   WBC 9.4 10/02/2018   HGB 8.5 (L) 10/02/2018   HCT 26.4 (L) 10/02/2018   MCV 100.8 (H) 10/02/2018   PLT PLATELET CLUMPS NOTED ON SMEAR, UNABLE TO ESITMATE 10/02/2018    Lipid Panel      Component Value Date/Time   CHOL 94 04/09/2014 0256   TRIG 124 04/09/2014 0256   HDL 33 (L) 04/09/2014 0256   CHOLHDL 2.8 04/09/2014 0256   VLDL 25 04/09/2014 0256   LDLCALC 36 04/09/2014 0256    BNP (last 3 results) No results for input(s): BNP in the last 8760 hours.  HEMOGLOBIN A1C Lab Results  Component Value Date   HGBA1C 9.0 (H) 04/08/2014   MPG 212 (H) 04/08/2014   FOLLOW UP PLANS AND APPOINTMENTS  Allergies as of 10/02/2018      Reactions   Bee Venom Anaphylaxis   Other    general anesthesia- Malignant hypothermia       Medication List    TAKE these medications   aspirin 81 MG tablet Take 1 tablet (81 mg total) by mouth daily.   buPROPion 150 MG 24 hr tablet Commonly known as:  WELLBUTRIN XL Take 150 mg by mouth daily.   carvedilol 12.5 MG tablet Commonly known as:  COREG Take 12.5 mg by mouth 2 (two) times daily with a meal.   CLEAR EYES OP Place 1 drop into both eyes daily as needed (dry eyes).   clopidogrel 75 MG tablet Commonly known as:  PLAVIX Take 1 tablet (75 mg total) by mouth daily with breakfast.   diclofenac sodium 1 % Gel Commonly known as:  VOLTAREN Apply 1 application topically 4 (four) times daily as needed (pain).   EPIPEN 0.3 mg/0.3 mL Soaj injection Generic drug:  EPINEPHrine Inject 0.3 mg into the muscle once as needed (for bee stings).   ferrous sulfate 325 (65 FE) MG tablet Take 325 mg by mouth 2 (two) times daily.   furosemide 20 MG tablet Commonly known as:  LASIX Take 20 mg by mouth daily.   insulin aspart 100 UNIT/ML injection Commonly known as:  novoLOG Inject 10-20 Units into the skin 3 (three) times daily before meals.   insulin glargine 100 UNIT/ML injection Commonly known as:  LANTUS Inject 27 Units into the skin daily.   multivitamin with minerals tablet Take 1 tablet by mouth daily.   nitroGLYCERIN 0.4 MG SL tablet Commonly known as:  NITROSTAT Place 0.4 mg under the tongue every 5 (five) minutes  as needed for chest pain.   OVER THE COUNTER MEDICATION Apply 1 application topically as needed (pain). Volfenac gel   pantoprazole 40 MG tablet Commonly known as:  PROTONIX Take 40 mg by mouth daily.   potassium chloride 10 MEQ tablet Commonly known as:  K-DUR,KLOR-CON Take 10 mEq by mouth daily.   ROLAIDS PO Take 1 tablet by mouth See admin instructions. Take 1 tablet at bedtime, may take an additional 1 tablet as needed for leg cramps   rosuvastatin 10 MG tablet Commonly known as:  CRESTOR Take 10 mg by mouth daily.   traMADol 50 MG tablet Commonly known as:  ULTRAM Take 50 mg by mouth every 6 (six) hours as needed for moderate pain.   VICTOZA 18 MG/3ML Soln injection Generic drug:  Liraglutide Inject 1.8 mg into the skin daily.      Follow-up Information    Adrian Prows, MD.   Specialty:  Cardiology Why:  Keep previous appointment Contact information: [place]  [place] [address] [phone]          Adrian Prows, MD 10/02/2018, 10:01 AM  Pager: [phone] Office: [phone] If no answer: [phone]

## 2018-10-10 DIAGNOSIS — I724 Aneurysm of artery of lower extremity: Secondary | ICD-10-CM | POA: Diagnosis not present

## 2018-10-10 DIAGNOSIS — E7800 Pure hypercholesterolemia, unspecified: Secondary | ICD-10-CM | POA: Diagnosis not present

## 2018-10-10 DIAGNOSIS — I25118 Atherosclerotic heart disease of native coronary artery with other forms of angina pectoris: Secondary | ICD-10-CM | POA: Diagnosis not present

## 2018-10-10 DIAGNOSIS — I739 Peripheral vascular disease, unspecified: Secondary | ICD-10-CM | POA: Diagnosis not present

## 2018-10-11 DIAGNOSIS — I724 Aneurysm of artery of lower extremity: Secondary | ICD-10-CM | POA: Diagnosis not present

## 2018-10-17 DIAGNOSIS — Z9861 Coronary angioplasty status: Secondary | ICD-10-CM | POA: Diagnosis not present

## 2018-10-17 DIAGNOSIS — I25118 Atherosclerotic heart disease of native coronary artery with other forms of angina pectoris: Secondary | ICD-10-CM | POA: Diagnosis not present

## 2018-10-17 DIAGNOSIS — I739 Peripheral vascular disease, unspecified: Secondary | ICD-10-CM | POA: Diagnosis not present

## 2018-10-25 ENCOUNTER — Ambulatory Visit (HOSPITAL_COMMUNITY)
Admission: RE | Admit: 2018-10-25 | Discharge: 2018-10-25 | Disposition: A | Discharge status: Home / Self Care | Payer: Medicare Other | Source: Ambulatory Visit | Attending: Cardiology | Admitting: Cardiology

## 2018-10-25 ENCOUNTER — Ambulatory Visit (HOSPITAL_BASED_OUTPATIENT_CLINIC_OR_DEPARTMENT_OTHER): Payer: Medicare Other

## 2018-10-25 ENCOUNTER — Encounter (HOSPITAL_COMMUNITY)
Admission: RE | Disposition: A | Discharge status: Home / Self Care | Payer: Self-pay | Source: Ambulatory Visit | Attending: Cardiology

## 2018-10-25 ENCOUNTER — Other Ambulatory Visit: Payer: Self-pay

## 2018-10-25 DIAGNOSIS — I25118 Atherosclerotic heart disease of native coronary artery with other forms of angina pectoris: Secondary | ICD-10-CM | POA: Insufficient documentation

## 2018-10-25 DIAGNOSIS — Z87891 Personal history of nicotine dependence: Secondary | ICD-10-CM | POA: Diagnosis not present

## 2018-10-25 DIAGNOSIS — Z794 Long term (current) use of insulin: Secondary | ICD-10-CM | POA: Diagnosis not present

## 2018-10-25 DIAGNOSIS — E1165 Type 2 diabetes mellitus with hyperglycemia: Secondary | ICD-10-CM | POA: Insufficient documentation

## 2018-10-25 DIAGNOSIS — M1990 Unspecified osteoarthritis, unspecified site: Secondary | ICD-10-CM | POA: Diagnosis not present

## 2018-10-25 DIAGNOSIS — E1151 Type 2 diabetes mellitus with diabetic peripheral angiopathy without gangrene: Secondary | ICD-10-CM | POA: Diagnosis not present

## 2018-10-25 DIAGNOSIS — T81718A Complication of other artery following a procedure, not elsewhere classified, initial encounter: Secondary | ICD-10-CM | POA: Diagnosis present

## 2018-10-25 DIAGNOSIS — Z539 Procedure and treatment not carried out, unspecified reason: Secondary | ICD-10-CM | POA: Diagnosis not present

## 2018-10-25 DIAGNOSIS — Z7982 Long term (current) use of aspirin: Secondary | ICD-10-CM | POA: Diagnosis not present

## 2018-10-25 DIAGNOSIS — I724 Aneurysm of artery of lower extremity: Secondary | ICD-10-CM | POA: Diagnosis not present

## 2018-10-25 DIAGNOSIS — I129 Hypertensive chronic kidney disease with stage 1 through stage 4 chronic kidney disease, or unspecified chronic kidney disease: Secondary | ICD-10-CM | POA: Insufficient documentation

## 2018-10-25 DIAGNOSIS — I729 Aneurysm of unspecified site: Secondary | ICD-10-CM

## 2018-10-25 DIAGNOSIS — N183 Chronic kidney disease, stage 3 (moderate): Secondary | ICD-10-CM | POA: Diagnosis not present

## 2018-10-25 DIAGNOSIS — E785 Hyperlipidemia, unspecified: Secondary | ICD-10-CM | POA: Insufficient documentation

## 2018-10-25 DIAGNOSIS — E1122 Type 2 diabetes mellitus with diabetic chronic kidney disease: Secondary | ICD-10-CM | POA: Insufficient documentation

## 2018-10-25 DIAGNOSIS — K219 Gastro-esophageal reflux disease without esophagitis: Secondary | ICD-10-CM | POA: Insufficient documentation

## 2018-10-25 LAB — GLUCOSE, CAPILLARY: Glucose-Capillary: 139 mg/dL — ABNORMAL HIGH (ref 70–99)

## 2018-10-25 SURGERY — PERIPHERAL VASCULAR THROMBECTOMY
Anesthesia: LOCAL

## 2018-10-25 MED ORDER — THROMBIN 5000 UNITS EX SOLR
5000.0000 [IU] | Freq: Once | CUTANEOUS | Status: DC
  Filled 2018-10-25 (×2): qty 5000

## 2018-10-25 MED ORDER — SODIUM CHLORIDE 0.9 % IV SOLN: INTRAVENOUS | Status: DC

## 2018-10-25 MED ORDER — LIDOCAINE HCL (PF) 1 % IJ SOLN
INTRAMUSCULAR | Status: AC
  Filled 2018-10-25: qty 30

## 2018-10-25 NOTE — H&P (Signed)
OFFICE VISIT NOTES COPIED TO EPIC FOR DOCUMENTATION  . History of Present Illness [name] MD; 10/12/2018 6:23 PM) Patient words: 7-10 day f/u pv cath; Last OV 09/13/2018.  The patient is a 84 year old female who presents with angina.  Additional reasons for visit:  Leg claudication is described as the following: [name] is a AA female with known coronary artery disease by angiography in 2015 which had revealed a distal small OM1 stenosis, otherwise no significant CAD, was recommended medical therapy, has had chronic stable angina, hypertension, former tobacco use, diabetes mellitus with stage III chronic kidney disease and hyperlipidemia. She underwent Lexiscan nuclear stress test in June 2019 and was considered low risk study.  Due to severe symptoms of claudication, severely abnormal lower extremity arterial duplex, underwent peripheral arteriogram with very limited contrast on 10/01/2018 via left groin access, had successful angioplasty of right SFA. Since then patient states the symptoms of claudication have essentially resolved. She does complain of left groin pain with activity at the access site. She has noticed bluish discoloration left groin and a small swelling but no other specific complaints today.    Problem List/Past Medical Mary Harding; 10/10/2018 1:17 PM) GERD (gastroesophageal reflux disease) (K21.9)  Arthritis (M19.90)  has 4 ruptured disc in back Chest pain, atypical (R07.89)  EKG 12/18/2012: Normal sinus rhythm at rate of 78 bpm, possible left atrial abnormality. Poor R-wave progression, probably normal variant. No evidence of ischemia. Benign essential HTN (I10)  Shortness of breath (R06.02)  Chest x-ray 11/12/2013: Stable chest x-ray no active lung disease. Diabetes mellitus with stage 3 chronic kidney disease (E11.22)  Labs 12/21/2014: BUN 13, serum creatinine 1.3. EGFR 42 mL. CMP otherwise normal. No change from Labs 12/21/2014,  09/01/2014 Hyperlipidemia, group A (E78.00)  Insulin dependent type 2 diabetes mellitus, uncontrolled (E11.65)  Labs 12/21/2014: HbA1c 8.1%. HB 11.2/HCT 35.4. TSH normal. Angina pectoris (I20.9)  Laboratory examination (Z01.89)  09/16/2018: Cholesterol 143, triglycerides 1.1, HDL 44, LDL 71. Creatinine 2.2, EGFR 23, potassium 3.9, CMP otherwise normal. RBC 3.39, hematocrit 33.5, MCV 99, CBC otherwise normal. 05/22/2018: CBC normal. Cholesterol 237, triglycerides 314, HDL 35, LDL 139. 03/28/2018: RBC 3.5, hemoglobin 10.5, hematocrit 33, MCHC 31.8, CBC otherwise normal. Glucose 119, creatinine 1.7, EGFR 29/36, potassium 3.7, CMP normal. PVD (peripheral vascular disease) with claudication (I73.9)  Peripheral arteriogram 10/01/2018: Right Prox and mid SFA 99% to 0% with directional atherectomy and DCB 5x150 mm InPact Admiral balloon. Right AT occluded. Right CIA 40 mm Hg PG. left Mid SFA focal 70-80% disease. Left AT appears occluded. Lower extremity arterial duplex 09/02/2018: Right SFA from proximal to mid appears to be near occlusion with severely abnormal monophasic waveform. Significant velocity increase at the left mid superficial femoral artery suggests >50% stenosis. Left SFA promimal to distal is monophasic waveform suggests near occlusion. This exam reveals moderately decreased perfusion of the lower extremity, noted at the post tibial artery level (RABI 0.54 and LABI 0.70). Consider further work-up. Coronary artery disease of native artery of native heart with stable angina pectoris (I25.118) [04/09/2014]: Lexiscan Sestamibi stress test 05/20/2018: 1. Lexiscan stress test was performed. Exercise capacity was not assessed. Stress symptoms included dyspnea, heartburn, headache. Peak blood pressure was 156/78 mmHg. The resting electrocardiogram demonstrated normal sinus rhythm, normal resting conduction, no resting arrhythmias and normal rest repolarization. Stress EKG is non diagnostic for ischemia as it  is a pharmacologic stress. 2. The overall quality of the study is good. There is no evidence of abnormal lung  activity. Stress and rest SPECT images demonstrate homogeneous tracer distribution throughout the myocardium. Gated SPECT imaging reveals normal myocardial thickening and wall motion. The left ventricular ejection fraction was normal (69%). 3. Low risk study. Coronary angiogram 04/09/2014: Hyperdynamic left ventricle. Mild diffuse noncritical coronary artery disease, Distal OM1 showing a 70-80% hazy stenosis, plaque. Recommended medical therapy only. Echo- 12/01/13 1. Left ventricular internal dimension is decreased. Mild to moderate concentric hypertrophy. Normal global wall motion. Normal systolic global function. Calculated EF 55%. Doppler evidence of Grade I (impaired) diastolic dysfunction with elevated LV filling pressure. 2. Left atrial cavity is slightly dilated. Mild aneurysmal motion of the interatrial septum. Lexiscan Sestamibi Stress 11/21/2013: 1. Resting EKG NSR, Poor R wave progression. Non specific ST-Tchange, cannot r/o lateral ischemia. Stress EKG was non diagnostic for ischemia. 2. The perfusion study demonstrated mild inferior wall soft tissue attenuation artifact. There was no evidence of ischemia or scar. Dynamic gated images reveal normal wall motion and endocardial thickening. Left ventricular ejection fraction was estimated to be 59%. This is a low risk study.  Allergies Mary Harding; 11/01/18 1:17 PM) No Known Drug Allergies [11/17/2013]:  Family History Mary Harding; 11/01/18 1:17 PM) Mother  Deceased. at age 92 diabetic, brain tumor, alzheimers Father  Deceased. at age 15 from an accident, no known heart issues Siblings  6, no known heart issues 1 sister died of cancer  Social History Mary Harding; 01-Nov-2018 1:17 PM) Current tobacco use  Former smoker. quit about 10 years ago Non Drinker/No Alcohol Use  Marital status  Widowed. Number of Children   6. Living Situation  Lives alone.  Past Surgical History Mary Furbish Mary Harding; 11-01-2018 1:17 PM) node removed from vocal cord [1974]: Cataract Extraction-Bilateral [2013]: had 2 code blue Laser Surgery [02/2014]: Right.  Medication History [name], MD; 11/01/2018 2:06 PM) Clopidogrel Bisulfate (75MG Tablet, 1 Oral daily, Taken starting 10/01/2018) Active. (Started Post right SFA PTA) Rosuvastatin Calcium (10MG Tablet, 1 Tablet Oral daily, Taken starting 10/04/2018) Active. Aspirin Adult Low Strength (81MG Tablet DR, 1 (one) Tablet DR Tablet DR T Oral daily, Taken starting 06/11/2018) Active. Carvedilol (12.5MG Tablet, 1 (one) Tablet Tablet Tablet T Oral two times daily, Taken starting 04/25/2018) Active. (increased from 6.59m on 06/15/14) amLODIPine Besylate (10MG Tablet, 1 (one) Tablet Tablet Tablet T Oral daily, Taken starting 04/25/2018) Active. Lantus (100UNIT/ML Solution, 28mSubcutaneous daily) Active. Protonix (40MG Tablet DR, 1 Oral daily) Active. Multivitamins (1 Oral daily) Active. EpiPen 2-Pak (0.3MG/0.3ML Soln Auto-inj, 1 Injection as needed) Active. Nitrostat (0.4MG Tab Sublingual, dissolve 1 tab under tongue Sublingual as needed for chest pain) Active. Furosemide (20MG Tablet, 1 Oral daily) Active. Glimepiride (1MG Tablet, 1 Oral daily) Active. Potassium Chloride Crys ER (10MEQ Tablet ER, 1 Oral daily) Active. Iron (325 (65 Fe)MG Tablet, 1 Oral two times daily) Active. buPROPion HCl ER (XL) (150MG Tablet ER 24HR, 1 Oral daily) Active. traMADol HCl (10MG/ML For Suspension, 1 Oral q6h) Active. Victoza (18MG/3ML Soln Pen-inj, 1.8 Subcutaneous daily) Active. NovoLOG FlexPen (100UNIT/ML Soln Pen-inj, sliding scale Subcutaneous daily) Active. Sertraline HCl (50MG Tablet, 1 Oral daily) Active. Valsartan (320MG Tablet, 1 Oral daily) Active. Betamethasone Dipropionate Aug (0.05% Ointment, External two times daily) Active. Mirtazapine (7.5MG  Tablet, 1 Oral daily) Active. Medications Reconciled (verbally "everything the same") Cilostazol (100MG Tablet, 1 (one) Tablet Oral two times daily, Taken starting 08/09/2018) Discontinued. (Started Plavix post PTA righrt SFA)  Diagnostic Studies History (SFrances Furbishohnson; 1011/15/2019:20 PM) ABI's  ABI 07/17/2018: This exam reveals moderately decreased perfusion of both the  lower extremities, noted at the post tibial artery level (ABI 0.58) with severely abnormal waveforms at both ankles. Consider further work-up. Lower Extremity Dopplers  09/02/2018: Right SFA from proximal to mid appears to be near occlusion with severely abnormal monophasic waveform. Significant velocity increase at the left mid superficial femoral artery suggests >50% stenosis. Left SFA promimal to distal is monophasic waveform suggests near occlusion. This exam reveals moderately decreased perfusion of the lower extremity, noted at the post tibial artery level (RABI 0.54 and LABI 0.70). Consider further work-up. Nuclear stress test  Lexiscan Sestamibi Stress 11/21/2013: 1. Resting EKG NSR, Poor R wave progression. Non specific ST-Tchange, cannot r/o lateral ischemia. Stress EKG was non diagnostic for ischemia. 2. The perfusion study demonstrated mild inferior wall soft tissue attenuation artifact. There was no evidence of ischemia or scar. Dynamic gated images reveal normal wall motion and endocardial thickening. Left ventricular ejection fraction was estimated to be 59%. This is a low risk study. Coronary Angiogram [04/09/2014]: 04/09/2014: Hyperdynamic left ventricle. Mild diffuse noncritical coronary artery disease, Distal OM1 showing a 70-80% hazy stenosis, ruptured lack, appeared well-healed. Recommended medical therapy only. Colonoscopy [2010]: Normal. Treadmill stress test [2012]: Nuclear stress test  05/20/2018: 1. Lexiscan stress test was performed. Exercise capacity was not assessed. Stress symptoms included  dyspnea, heartburn, headache. Peak blood pressure was 156/78 mmHg. The resting electrocardiogram demonstrated normal sinus rhythm, normal resting conduction, no resting arrhythmias and normal rest repolarization. Stress EKG is non diagnostic for ischemia as it is a pharmacologic stress. 2. The overall quality of the study is good. There is no evidence of abnormal lung activity. Stress and rest SPECT images demonstrate homogeneous tracer distribution throughout the myocardium. Gated SPECT imaging reveals normal myocardial thickening and wall motion. The left ventricular ejection fraction was normal (69%). 3. Low risk study. Sleep Study [2011]: tested positive for sleep apnea, but does not use a CPAP Echocardiogram  Echo- 12/01/13 1. Left ventricular internal dimension is decreased. Mild to moderate concentric hypertrophy. Normal global wall motion. Normal systolic global function. Calculated EF 55%. Doppler evidence of Grade I (impaired) diastolic dysfunction with elevated LV filling pressure. 2. Left atrial cavity is slightly dilated. Mild aneurysmal motion of the interatrial septum. 3. Mitral valve structurally normal. Mild calcification of the mitral annulus Trace mitral regurgitation. Mitral valve inflow A > E ratio. 4. Tricuspid valve structurally normal. Trace tricuspid regurgitation. PV Cath  10/01/2018: Right Prox and mid SFA 99% to 0% with directional atherectomy and DCB 5x150 mm InPact Admiral balloon. Right AT occluded. Right CIA 40 mm Hg PG. left Mid SFA focal 70-80% disease. Left AT appears occluded.    Review of Systems [name] MD; 10/12/2018 6:24 PM) General Not Present- Fatigue, Fever and Night Sweats. Skin Not Present- Itching and Rash. HEENT Not Present- Headache. Respiratory Not Present- Bloody sputum, Hemoptysis and Wakes up from Sleep Wheezing or Short of Breath. Cardiovascular Present- Difficulty Breathing On Exertion and Leg Cramps (Right leg, improved since  angioplasty). Not Present- Chest Pain, Fainting, Orthopnea and Swelling of Extremities. Gastrointestinal Present- Diarrhea. Not Present- Abdominal Pain, Constipation, Nausea and Vomiting. Musculoskeletal Present- Back Pain, Backache and Joint Pain (bilateral knee). Neurological Not Present- Headaches. Hematology Not Present- Blood Clots, Easy Bruising and Nose Bleed. All other systems negative  Vitals [name] MD; 10/10/2018 2:03 PM) 10/10/2018 1:21 PM Weight: 170.19 lb Height: 65in Body Surface Area: 1.85 m Body Mass Index: 28.32 kg/m  Pulse: 84 (Regular)  P.OX: 98% (Room air) BP: 124/68 (Sitting, Left  Arm, Standard)       Physical Exam [name] MD; 10/10/2018 2:14 PM) General Mental Status-Alert. General Appearance-Cooperative, Appears stated age, Not in acute distress. Orientation-Oriented X3. Build & Nutrition-Well nourished and Moderately built.  Head and Neck Thyroid Gland Characteristics - no palpable nodules, no palpable enlargement.  Chest and Lung Exam Chest and lung exam reveals -quiet, even and easy respiratory effort with no use of accessory muscles. Palpation Tender - No chest wall tenderness.  Cardiovascular Cardiovascular examination reveals -carotid auscultation reveals no bruits. Inspection Jugular vein - Right - No Distention. Auscultation Heart Sounds - S4.  Abdomen Inspection Contour - Obese. Palpation/Percussion Normal exam - Non Tender and No hepatosplenomegaly. Auscultation Normal exam - Bowel sounds normal.  Peripheral Vascular Lower Extremity Inspection - Bilateral - No Varicose veins. Palpation - Edema - Left - No edema. Right - No edema. Femoral pulse - Left - Feeble(ecchymosis and pulsatile small mass measures 2 cm, probably hematoma). Bilateral - Feeble(Pulsus difficult to feel due to patient's bodily habitus.). Popliteal pulse - Bilateral - Feeble(Pulsus difficult to feel due to  patient's bodily habitus.). Dorsalis pedis pulse - Bilateral - Feeble. Posterior tibial pulse - Left - Absent. Right - 2+. Carotid arteries - Left-No Carotid bruit. Carotid arteries - Right-No Carotid bruit. Abdomen-No prominent abdominal aortic pulsation, No epigastric bruit.  Neurologic Motor-Grossly intact without any focal deficits.  Musculoskeletal Global Assessment Left Lower Extremity - normal range of motion without pain. Right Lower Extremity - normal range of motion without pain.  Assessment & Plan [name] MD; 10/12/2018 6:24 PM) PVD (peripheral vascular disease) with claudication (I73.9) Story: Peripheral arteriogram 10/01/2018: Right Prox and mid SFA 99% to 0% with directional atherectomy and DCB 5x150 mm InPact Admiral balloon. Right AT occluded. Right CIA 40 mm Hg PG. left Mid SFA focal 70-80% disease. Left AT appears occluded.  Lower extremity arterial duplex 09/02/2018: Right SFA from proximal to mid appears to be near occlusion with severely abnormal monophasic waveform. Significant velocity increase at the left mid superficial femoral artery suggests >50% stenosis. Left SFA promimal to distal is monophasic waveform suggests near occlusion. This exam reveals moderately decreased perfusion of the lower extremity, noted at the post tibial artery level (RABI 0.54 and LABI 0.70). Consider further work-up. Current Plans Restarted Clopidogrel Bisulfate 75MG, 1 Tablet daily, #90, 90 days starting 10/10/2018, Ref. x3. Local Order: Started Post right SFA PTA 10/01/18. 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 Left Femoral Pseudoaneurysm measures 1.3x2.4 cm Multiple compressions to close off the aneurysm  were attempted but unsuccessful in spite of holding appropriate compression for 8 minutes x 2. Consider surgical consultation or injection of pseudoaneurysm with thrombin (anatomy appears appropriate). Hyperlipidemia, group A (E78.00) Current Plans Continued Rosuvastatin Calcium 10MG, 1 Tablet daily, #90, 90 days starting 10/10/2018, Ref. x3. Coronary artery disease of native artery of native heart with stable angina pectoris (I25.118) Story: Lexiscan Sestamibi stress test 05/20/2018: 1. Lexiscan stress test was performed. Exercise capacity was not assessed. Stress symptoms included dyspnea, heartburn, headache. Peak blood pressure was 156/78 mmHg. The resting electrocardiogram demonstrated normal sinus rhythm, normal resting conduction, no resting arrhythmias and normal rest repolarization. Stress EKG is non diagnostic for ischemia as it is a pharmacologic stress. 2. The overall quality of the study  is good. There is no evidence of abnormal lung activity. Stress and rest SPECT images demonstrate homogeneous tracer distribution throughout the myocardium. Gated SPECT imaging reveals normal myocardial thickening and wall motion. The left ventricular ejection fraction was normal (69%). 3. Low risk study.  Coronary angiogram 04/09/2014: Hyperdynamic left ventricle. Mild diffuse noncritical coronary artery disease, Distal OM1 showing a 70-80% hazy stenosis, plaque. Recommended medical therapy only.  Echo- 12/01/13 1. Left ventricular internal dimension is decreased. Mild to moderate concentric hypertrophy. Normal global wall motion. Normal systolic global function. Calculated EF 55%. Doppler evidence of Grade I (impaired) diastolic dysfunction with elevated LV filling pressure. 2. Left atrial cavity is slightly dilated. Mild aneurysmal motion of the interatrial septum.  Lexiscan Sestamibi Stress 11/21/2013: 1. Resting EKG NSR, Poor R wave progression. Non specific ST-Tchange, cannot r/o lateral ischemia.  Stress EKG was non diagnostic for ischemia. 2. The perfusion study demonstrated mild inferior wall soft tissue attenuation artifact. There was no evidence of ischemia or scar. Dynamic gated images reveal normal wall motion and endocardial thickening. Left ventricular ejection fraction was estimated to be 59%. This is a low risk study. Impression: EKG 04/25/2018: Sinus rhythm with first-degree AV block at the rate of 88 bpm, left atrial abnormality, normal axis. Poor R-wave progression, cannot exclude anteroseptal infarct old. Normal QT interval. Normal EKG. (T abnormality aVL, probably normal). No significant change from EKG 08/13/2015. Benign essential HTN (I10) Laboratory examination (E02.23) Story: 09/16/2018: Cholesterol 143, triglycerides 1.1, HDL 44, LDL 71. Creatinine 2.2, EGFR 23, potassium 3.9, CMP otherwise normal. RBC 3.39, hematocrit 33.5, MCV 99, CBC otherwise normal.  05/22/2018: CBC normal. Cholesterol 237, triglycerides 314, HDL 35, LDL 139.  03/28/2018: RBC 3.5, hemoglobin 10.5, hematocrit 33, MCHC 31.8, CBC otherwise normal. Glucose 119, creatinine 1.7, EGFR 29/36, potassium 3.7, CMP normal.  Note:-  Recommendations:  [name] is a AA female with known coronary artery disease by angiography in 2015 which had revealed a distal small OM1 stenosis, otherwise no significant CAD, was recommended medical therapy, has had chronic stable angina, hypertension, diabetes mellitus with stage III chronic kidney disease and hyperlipidemia.   Due to severe symptoms of claudication and severely abnormal ABI and lower extremity arterial duplex underwent left SFA atherectomy followed by drug coated balloon angioplasty on 10/01/2018. She does have a left groin hematoma and a bruit, I would like to obtain ultrasound of the groin to exclude pseudoaneurysm formation. She now has an excellent pulses in both right PT and feeble pulse in the right AT.  She does have residual left SFA stenosis.  However, as long as she remains asymptomatic. Continue medical therapy is advised. I have resumed her Plavix that she was started on in the hospital and also statins.  Addendum: [place] left groin positie for a small 1.3x2.4 cm pseudoaneurysm, unsuccessful compression effort and pseudoaneurysm persisted. Will schedule for thrombin injection.  CC: Dr. Deland Pretty.    Signed by [name], MD (10/12/2018 6:25 PM)

## 2018-10-25 NOTE — Progress Notes (Signed)
*  PRELIMINARY RESULTS* Vascular Ultrasound Limited Left Lower Extremity Arterial Duplex has been completed. There is no evidence of pseudoaneurysm. There is a heterogenous area of the left groin, suggestive of hematoma.  Preliminary results discussed with Dr. Einar Gip.  10/25/2018 10:20 AM Maudry Mayhew, MHA, RVT, RDCS, RDMS

## 2018-11-13 DIAGNOSIS — M5441 Lumbago with sciatica, right side: Secondary | ICD-10-CM | POA: Diagnosis not present

## 2018-11-13 DIAGNOSIS — E118 Type 2 diabetes mellitus with unspecified complications: Secondary | ICD-10-CM | POA: Diagnosis not present

## 2018-11-18 DIAGNOSIS — N183 Chronic kidney disease, stage 3 (moderate): Secondary | ICD-10-CM | POA: Diagnosis not present

## 2018-11-19 DIAGNOSIS — R531 Weakness: Secondary | ICD-10-CM | POA: Diagnosis not present

## 2018-11-19 DIAGNOSIS — G629 Polyneuropathy, unspecified: Secondary | ICD-10-CM | POA: Diagnosis not present

## 2018-11-19 DIAGNOSIS — R2681 Unsteadiness on feet: Secondary | ICD-10-CM | POA: Diagnosis not present

## 2018-11-19 DIAGNOSIS — R262 Difficulty in walking, not elsewhere classified: Secondary | ICD-10-CM | POA: Diagnosis not present

## 2018-11-19 DIAGNOSIS — M545 Low back pain: Secondary | ICD-10-CM | POA: Diagnosis not present

## 2018-11-25 DIAGNOSIS — E876 Hypokalemia: Secondary | ICD-10-CM | POA: Diagnosis not present

## 2018-11-25 DIAGNOSIS — I129 Hypertensive chronic kidney disease with stage 1 through stage 4 chronic kidney disease, or unspecified chronic kidney disease: Secondary | ICD-10-CM | POA: Diagnosis not present

## 2018-11-25 DIAGNOSIS — N2581 Secondary hyperparathyroidism of renal origin: Secondary | ICD-10-CM | POA: Diagnosis not present

## 2018-11-25 DIAGNOSIS — N184 Chronic kidney disease, stage 4 (severe): Secondary | ICD-10-CM | POA: Diagnosis not present

## 2018-11-26 DIAGNOSIS — G629 Polyneuropathy, unspecified: Secondary | ICD-10-CM | POA: Diagnosis not present

## 2018-11-26 DIAGNOSIS — R2681 Unsteadiness on feet: Secondary | ICD-10-CM | POA: Diagnosis not present

## 2018-11-26 DIAGNOSIS — R531 Weakness: Secondary | ICD-10-CM | POA: Diagnosis not present

## 2018-11-26 DIAGNOSIS — M545 Low back pain: Secondary | ICD-10-CM | POA: Diagnosis not present

## 2018-11-26 DIAGNOSIS — R262 Difficulty in walking, not elsewhere classified: Secondary | ICD-10-CM | POA: Diagnosis not present

## 2018-12-03 DIAGNOSIS — R2681 Unsteadiness on feet: Secondary | ICD-10-CM | POA: Diagnosis not present

## 2018-12-03 DIAGNOSIS — R531 Weakness: Secondary | ICD-10-CM | POA: Diagnosis not present

## 2018-12-03 DIAGNOSIS — G629 Polyneuropathy, unspecified: Secondary | ICD-10-CM | POA: Diagnosis not present

## 2018-12-03 DIAGNOSIS — R262 Difficulty in walking, not elsewhere classified: Secondary | ICD-10-CM | POA: Diagnosis not present

## 2018-12-03 DIAGNOSIS — M545 Low back pain: Secondary | ICD-10-CM | POA: Diagnosis not present

## 2018-12-04 DIAGNOSIS — N183 Chronic kidney disease, stage 3 (moderate): Secondary | ICD-10-CM | POA: Diagnosis not present

## 2018-12-04 DIAGNOSIS — R7989 Other specified abnormal findings of blood chemistry: Secondary | ICD-10-CM | POA: Diagnosis not present

## 2018-12-04 DIAGNOSIS — I10 Essential (primary) hypertension: Secondary | ICD-10-CM | POA: Diagnosis not present

## 2018-12-04 DIAGNOSIS — E118 Type 2 diabetes mellitus with unspecified complications: Secondary | ICD-10-CM | POA: Diagnosis not present

## 2018-12-13 DIAGNOSIS — R262 Difficulty in walking, not elsewhere classified: Secondary | ICD-10-CM | POA: Diagnosis not present

## 2018-12-13 DIAGNOSIS — R531 Weakness: Secondary | ICD-10-CM | POA: Diagnosis not present

## 2018-12-13 DIAGNOSIS — M545 Low back pain: Secondary | ICD-10-CM | POA: Diagnosis not present

## 2018-12-13 DIAGNOSIS — G629 Polyneuropathy, unspecified: Secondary | ICD-10-CM | POA: Diagnosis not present

## 2018-12-13 DIAGNOSIS — R2681 Unsteadiness on feet: Secondary | ICD-10-CM | POA: Diagnosis not present

## 2018-12-17 DIAGNOSIS — R531 Weakness: Secondary | ICD-10-CM | POA: Diagnosis not present

## 2018-12-17 DIAGNOSIS — G629 Polyneuropathy, unspecified: Secondary | ICD-10-CM | POA: Diagnosis not present

## 2018-12-17 DIAGNOSIS — M545 Low back pain: Secondary | ICD-10-CM | POA: Diagnosis not present

## 2018-12-17 DIAGNOSIS — R262 Difficulty in walking, not elsewhere classified: Secondary | ICD-10-CM | POA: Diagnosis not present

## 2018-12-17 DIAGNOSIS — R2681 Unsteadiness on feet: Secondary | ICD-10-CM | POA: Diagnosis not present

## 2018-12-19 DIAGNOSIS — R2681 Unsteadiness on feet: Secondary | ICD-10-CM | POA: Diagnosis not present

## 2018-12-19 DIAGNOSIS — R262 Difficulty in walking, not elsewhere classified: Secondary | ICD-10-CM | POA: Diagnosis not present

## 2018-12-19 DIAGNOSIS — G629 Polyneuropathy, unspecified: Secondary | ICD-10-CM | POA: Diagnosis not present

## 2018-12-19 DIAGNOSIS — R531 Weakness: Secondary | ICD-10-CM | POA: Diagnosis not present

## 2018-12-19 DIAGNOSIS — M545 Low back pain: Secondary | ICD-10-CM | POA: Diagnosis not present

## 2018-12-24 DIAGNOSIS — R2681 Unsteadiness on feet: Secondary | ICD-10-CM | POA: Diagnosis not present

## 2018-12-24 DIAGNOSIS — R531 Weakness: Secondary | ICD-10-CM | POA: Diagnosis not present

## 2018-12-24 DIAGNOSIS — M545 Low back pain: Secondary | ICD-10-CM | POA: Diagnosis not present

## 2018-12-24 DIAGNOSIS — G629 Polyneuropathy, unspecified: Secondary | ICD-10-CM | POA: Diagnosis not present

## 2018-12-24 DIAGNOSIS — R262 Difficulty in walking, not elsewhere classified: Secondary | ICD-10-CM | POA: Diagnosis not present

## 2018-12-25 DIAGNOSIS — I739 Peripheral vascular disease, unspecified: Secondary | ICD-10-CM | POA: Diagnosis not present

## 2018-12-27 DIAGNOSIS — G629 Polyneuropathy, unspecified: Secondary | ICD-10-CM | POA: Diagnosis not present

## 2018-12-27 DIAGNOSIS — M545 Low back pain: Secondary | ICD-10-CM | POA: Diagnosis not present

## 2018-12-27 DIAGNOSIS — R531 Weakness: Secondary | ICD-10-CM | POA: Diagnosis not present

## 2018-12-27 DIAGNOSIS — R262 Difficulty in walking, not elsewhere classified: Secondary | ICD-10-CM | POA: Diagnosis not present

## 2018-12-27 DIAGNOSIS — R2681 Unsteadiness on feet: Secondary | ICD-10-CM | POA: Diagnosis not present

## 2018-12-31 DIAGNOSIS — R262 Difficulty in walking, not elsewhere classified: Secondary | ICD-10-CM | POA: Diagnosis not present

## 2018-12-31 DIAGNOSIS — R531 Weakness: Secondary | ICD-10-CM | POA: Diagnosis not present

## 2018-12-31 DIAGNOSIS — R2681 Unsteadiness on feet: Secondary | ICD-10-CM | POA: Diagnosis not present

## 2018-12-31 DIAGNOSIS — G629 Polyneuropathy, unspecified: Secondary | ICD-10-CM | POA: Diagnosis not present

## 2018-12-31 DIAGNOSIS — M545 Low back pain: Secondary | ICD-10-CM | POA: Diagnosis not present

## 2019-01-01 DIAGNOSIS — E7800 Pure hypercholesterolemia, unspecified: Secondary | ICD-10-CM | POA: Diagnosis not present

## 2019-01-01 DIAGNOSIS — I739 Peripheral vascular disease, unspecified: Secondary | ICD-10-CM | POA: Diagnosis not present

## 2019-01-01 DIAGNOSIS — I25118 Atherosclerotic heart disease of native coronary artery with other forms of angina pectoris: Secondary | ICD-10-CM | POA: Diagnosis not present

## 2019-01-01 DIAGNOSIS — I10 Essential (primary) hypertension: Secondary | ICD-10-CM | POA: Diagnosis not present

## 2019-01-07 DIAGNOSIS — Z6828 Body mass index (BMI) 28.0-28.9, adult: Secondary | ICD-10-CM | POA: Diagnosis not present

## 2019-01-07 DIAGNOSIS — I10 Essential (primary) hypertension: Secondary | ICD-10-CM | POA: Diagnosis not present

## 2019-01-07 DIAGNOSIS — M5416 Radiculopathy, lumbar region: Secondary | ICD-10-CM | POA: Diagnosis not present

## 2019-01-09 ENCOUNTER — Other Ambulatory Visit: Payer: Self-pay | Admitting: Neurosurgery

## 2019-01-09 DIAGNOSIS — M5416 Radiculopathy, lumbar region: Secondary | ICD-10-CM

## 2019-01-12 ENCOUNTER — Ambulatory Visit
Admission: RE | Admit: 2019-01-12 | Discharge: 2019-01-12 | Disposition: A | Discharge status: Home / Self Care | Payer: Medicare Other | Source: Ambulatory Visit | Attending: Neurosurgery | Admitting: Neurosurgery

## 2019-01-12 DIAGNOSIS — M5416 Radiculopathy, lumbar region: Secondary | ICD-10-CM

## 2019-01-12 DIAGNOSIS — M5136 Other intervertebral disc degeneration, lumbar region: Secondary | ICD-10-CM | POA: Diagnosis not present

## 2019-01-14 DIAGNOSIS — M5416 Radiculopathy, lumbar region: Secondary | ICD-10-CM | POA: Diagnosis not present

## 2019-01-17 DIAGNOSIS — R531 Weakness: Secondary | ICD-10-CM | POA: Diagnosis not present

## 2019-01-17 DIAGNOSIS — R2681 Unsteadiness on feet: Secondary | ICD-10-CM | POA: Diagnosis not present

## 2019-01-17 DIAGNOSIS — R262 Difficulty in walking, not elsewhere classified: Secondary | ICD-10-CM | POA: Diagnosis not present

## 2019-01-17 DIAGNOSIS — G629 Polyneuropathy, unspecified: Secondary | ICD-10-CM | POA: Diagnosis not present

## 2019-01-17 DIAGNOSIS — M545 Low back pain: Secondary | ICD-10-CM | POA: Diagnosis not present

## 2019-01-21 DIAGNOSIS — M545 Low back pain: Secondary | ICD-10-CM | POA: Diagnosis not present

## 2019-01-21 DIAGNOSIS — R262 Difficulty in walking, not elsewhere classified: Secondary | ICD-10-CM | POA: Diagnosis not present

## 2019-01-21 DIAGNOSIS — R2681 Unsteadiness on feet: Secondary | ICD-10-CM | POA: Diagnosis not present

## 2019-01-21 DIAGNOSIS — R531 Weakness: Secondary | ICD-10-CM | POA: Diagnosis not present

## 2019-01-21 DIAGNOSIS — G629 Polyneuropathy, unspecified: Secondary | ICD-10-CM | POA: Diagnosis not present

## 2019-01-28 DIAGNOSIS — E118 Type 2 diabetes mellitus with unspecified complications: Secondary | ICD-10-CM | POA: Diagnosis not present

## 2019-01-28 DIAGNOSIS — R7989 Other specified abnormal findings of blood chemistry: Secondary | ICD-10-CM | POA: Diagnosis not present

## 2019-01-28 DIAGNOSIS — I10 Essential (primary) hypertension: Secondary | ICD-10-CM | POA: Diagnosis not present

## 2019-01-29 DIAGNOSIS — R262 Difficulty in walking, not elsewhere classified: Secondary | ICD-10-CM | POA: Diagnosis not present

## 2019-01-29 DIAGNOSIS — R2681 Unsteadiness on feet: Secondary | ICD-10-CM | POA: Diagnosis not present

## 2019-01-29 DIAGNOSIS — M545 Low back pain: Secondary | ICD-10-CM | POA: Diagnosis not present

## 2019-01-29 DIAGNOSIS — R531 Weakness: Secondary | ICD-10-CM | POA: Diagnosis not present

## 2019-01-29 DIAGNOSIS — G629 Polyneuropathy, unspecified: Secondary | ICD-10-CM | POA: Diagnosis not present

## 2019-01-31 DIAGNOSIS — H401131 Primary open-angle glaucoma, bilateral, mild stage: Secondary | ICD-10-CM | POA: Diagnosis not present

## 2019-02-04 DIAGNOSIS — M47816 Spondylosis without myelopathy or radiculopathy, lumbar region: Secondary | ICD-10-CM | POA: Diagnosis not present

## 2019-02-10 DIAGNOSIS — R7989 Other specified abnormal findings of blood chemistry: Secondary | ICD-10-CM | POA: Diagnosis not present

## 2019-02-10 DIAGNOSIS — E118 Type 2 diabetes mellitus with unspecified complications: Secondary | ICD-10-CM | POA: Diagnosis not present

## 2019-02-10 DIAGNOSIS — E789 Disorder of lipoprotein metabolism, unspecified: Secondary | ICD-10-CM | POA: Diagnosis not present

## 2019-02-10 DIAGNOSIS — I10 Essential (primary) hypertension: Secondary | ICD-10-CM | POA: Diagnosis not present

## 2019-02-10 DIAGNOSIS — N184 Chronic kidney disease, stage 4 (severe): Secondary | ICD-10-CM | POA: Diagnosis not present

## 2019-03-04 DIAGNOSIS — M47816 Spondylosis without myelopathy or radiculopathy, lumbar region: Secondary | ICD-10-CM | POA: Diagnosis not present

## 2019-03-20 DIAGNOSIS — M47816 Spondylosis without myelopathy or radiculopathy, lumbar region: Secondary | ICD-10-CM | POA: Diagnosis not present

## 2019-03-20 DIAGNOSIS — Z6828 Body mass index (BMI) 28.0-28.9, adult: Secondary | ICD-10-CM | POA: Diagnosis not present

## 2019-03-24 DIAGNOSIS — N189 Chronic kidney disease, unspecified: Secondary | ICD-10-CM | POA: Diagnosis not present

## 2019-03-24 DIAGNOSIS — N184 Chronic kidney disease, stage 4 (severe): Secondary | ICD-10-CM | POA: Diagnosis not present

## 2019-04-02 DIAGNOSIS — D631 Anemia in chronic kidney disease: Secondary | ICD-10-CM | POA: Diagnosis not present

## 2019-04-02 DIAGNOSIS — I129 Hypertensive chronic kidney disease with stage 1 through stage 4 chronic kidney disease, or unspecified chronic kidney disease: Secondary | ICD-10-CM | POA: Diagnosis not present

## 2019-04-02 DIAGNOSIS — N184 Chronic kidney disease, stage 4 (severe): Secondary | ICD-10-CM | POA: Diagnosis not present

## 2019-04-02 DIAGNOSIS — E876 Hypokalemia: Secondary | ICD-10-CM | POA: Diagnosis not present

## 2019-04-02 DIAGNOSIS — N2581 Secondary hyperparathyroidism of renal origin: Secondary | ICD-10-CM | POA: Diagnosis not present

## 2019-04-04 ENCOUNTER — Other Ambulatory Visit: Payer: Self-pay | Admitting: Cardiology

## 2019-04-04 DIAGNOSIS — I739 Peripheral vascular disease, unspecified: Secondary | ICD-10-CM

## 2019-04-17 DIAGNOSIS — Z7189 Other specified counseling: Secondary | ICD-10-CM | POA: Diagnosis not present

## 2019-04-17 DIAGNOSIS — F329 Major depressive disorder, single episode, unspecified: Secondary | ICD-10-CM | POA: Diagnosis not present

## 2019-04-17 DIAGNOSIS — M5126 Other intervertebral disc displacement, lumbar region: Secondary | ICD-10-CM | POA: Diagnosis not present

## 2019-04-17 DIAGNOSIS — M5136 Other intervertebral disc degeneration, lumbar region: Secondary | ICD-10-CM | POA: Diagnosis not present

## 2019-04-17 DIAGNOSIS — K5900 Constipation, unspecified: Secondary | ICD-10-CM | POA: Diagnosis not present

## 2019-04-24 DIAGNOSIS — K5900 Constipation, unspecified: Secondary | ICD-10-CM | POA: Diagnosis not present

## 2019-04-24 DIAGNOSIS — K219 Gastro-esophageal reflux disease without esophagitis: Secondary | ICD-10-CM | POA: Diagnosis not present

## 2019-04-24 DIAGNOSIS — E611 Iron deficiency: Secondary | ICD-10-CM | POA: Diagnosis not present

## 2019-04-24 DIAGNOSIS — M5136 Other intervertebral disc degeneration, lumbar region: Secondary | ICD-10-CM | POA: Diagnosis not present

## 2019-04-24 DIAGNOSIS — M5126 Other intervertebral disc displacement, lumbar region: Secondary | ICD-10-CM | POA: Diagnosis not present

## 2019-04-24 DIAGNOSIS — F329 Major depressive disorder, single episode, unspecified: Secondary | ICD-10-CM | POA: Diagnosis not present

## 2019-04-24 DIAGNOSIS — Z7189 Other specified counseling: Secondary | ICD-10-CM | POA: Diagnosis not present

## 2019-04-30 DIAGNOSIS — M47816 Spondylosis without myelopathy or radiculopathy, lumbar region: Secondary | ICD-10-CM | POA: Diagnosis not present

## 2019-05-01 ENCOUNTER — Other Ambulatory Visit: Payer: Self-pay

## 2019-05-01 ENCOUNTER — Other Ambulatory Visit: Payer: Self-pay | Admitting: Internal Medicine

## 2019-05-01 ENCOUNTER — Encounter (HOSPITAL_COMMUNITY): Payer: Self-pay

## 2019-05-01 ENCOUNTER — Ambulatory Visit
Admission: RE | Admit: 2019-05-01 | Discharge: 2019-05-01 | Disposition: A | Discharge status: Home / Self Care | Payer: Medicare Other | Source: Ambulatory Visit | Attending: Internal Medicine | Admitting: Internal Medicine

## 2019-05-01 ENCOUNTER — Inpatient Hospital Stay (HOSPITAL_COMMUNITY): Payer: Medicare Other

## 2019-05-01 ENCOUNTER — Inpatient Hospital Stay (HOSPITAL_COMMUNITY)
Admission: EM | Admit: 2019-05-01 | Discharge: 2019-05-08 | DRG: 326 | Disposition: A | Discharge status: Hospice | Payer: Medicare Other | Attending: Family Medicine | Admitting: Family Medicine

## 2019-05-01 ENCOUNTER — Emergency Department (HOSPITAL_COMMUNITY): Payer: Medicare Other

## 2019-05-01 DIAGNOSIS — J690 Pneumonitis due to inhalation of food and vomit: Secondary | ICD-10-CM | POA: Diagnosis not present

## 2019-05-01 DIAGNOSIS — F329 Major depressive disorder, single episode, unspecified: Secondary | ICD-10-CM | POA: Diagnosis not present

## 2019-05-01 DIAGNOSIS — R195 Other fecal abnormalities: Secondary | ICD-10-CM | POA: Diagnosis not present

## 2019-05-01 DIAGNOSIS — E43 Unspecified severe protein-calorie malnutrition: Secondary | ICD-10-CM | POA: Diagnosis not present

## 2019-05-01 DIAGNOSIS — M47816 Spondylosis without myelopathy or radiculopathy, lumbar region: Secondary | ICD-10-CM | POA: Diagnosis present

## 2019-05-01 DIAGNOSIS — R1084 Generalized abdominal pain: Secondary | ICD-10-CM

## 2019-05-01 DIAGNOSIS — J90 Pleural effusion, not elsewhere classified: Secondary | ICD-10-CM | POA: Diagnosis not present

## 2019-05-01 DIAGNOSIS — Z7401 Bed confinement status: Secondary | ICD-10-CM | POA: Diagnosis not present

## 2019-05-01 DIAGNOSIS — Z833 Family history of diabetes mellitus: Secondary | ICD-10-CM | POA: Diagnosis not present

## 2019-05-01 DIAGNOSIS — Z7982 Long term (current) use of aspirin: Secondary | ICD-10-CM

## 2019-05-01 DIAGNOSIS — K5900 Constipation, unspecified: Secondary | ICD-10-CM | POA: Diagnosis present

## 2019-05-01 DIAGNOSIS — N189 Chronic kidney disease, unspecified: Secondary | ICD-10-CM

## 2019-05-01 DIAGNOSIS — G4733 Obstructive sleep apnea (adult) (pediatric): Secondary | ICD-10-CM | POA: Diagnosis present

## 2019-05-01 DIAGNOSIS — Z9981 Dependence on supplemental oxygen: Secondary | ICD-10-CM | POA: Diagnosis not present

## 2019-05-01 DIAGNOSIS — B159 Hepatitis A without hepatic coma: Secondary | ICD-10-CM | POA: Diagnosis present

## 2019-05-01 DIAGNOSIS — N179 Acute kidney failure, unspecified: Secondary | ICD-10-CM | POA: Diagnosis not present

## 2019-05-01 DIAGNOSIS — R109 Unspecified abdominal pain: Secondary | ICD-10-CM | POA: Diagnosis not present

## 2019-05-01 DIAGNOSIS — K5730 Diverticulosis of large intestine without perforation or abscess without bleeding: Secondary | ICD-10-CM | POA: Diagnosis present

## 2019-05-01 DIAGNOSIS — Z6830 Body mass index (BMI) 30.0-30.9, adult: Secondary | ICD-10-CM | POA: Diagnosis not present

## 2019-05-01 DIAGNOSIS — R1900 Intra-abdominal and pelvic swelling, mass and lump, unspecified site: Secondary | ICD-10-CM | POA: Diagnosis not present

## 2019-05-01 DIAGNOSIS — Z7189 Other specified counseling: Secondary | ICD-10-CM | POA: Diagnosis not present

## 2019-05-01 DIAGNOSIS — N838 Other noninflammatory disorders of ovary, fallopian tube and broad ligament: Secondary | ICD-10-CM

## 2019-05-01 DIAGNOSIS — C7800 Secondary malignant neoplasm of unspecified lung: Secondary | ICD-10-CM | POA: Diagnosis not present

## 2019-05-01 DIAGNOSIS — C569 Malignant neoplasm of unspecified ovary: Secondary | ICD-10-CM | POA: Diagnosis not present

## 2019-05-01 DIAGNOSIS — Z20828 Contact with and (suspected) exposure to other viral communicable diseases: Secondary | ICD-10-CM | POA: Diagnosis present

## 2019-05-01 DIAGNOSIS — R188 Other ascites: Secondary | ICD-10-CM | POA: Diagnosis present

## 2019-05-01 DIAGNOSIS — K644 Residual hemorrhoidal skin tags: Secondary | ICD-10-CM | POA: Diagnosis present

## 2019-05-01 DIAGNOSIS — K222 Esophageal obstruction: Secondary | ICD-10-CM | POA: Diagnosis present

## 2019-05-01 DIAGNOSIS — E785 Hyperlipidemia, unspecified: Secondary | ICD-10-CM | POA: Diagnosis present

## 2019-05-01 DIAGNOSIS — Z6827 Body mass index (BMI) 27.0-27.9, adult: Secondary | ICD-10-CM

## 2019-05-01 DIAGNOSIS — K769 Liver disease, unspecified: Secondary | ICD-10-CM | POA: Diagnosis not present

## 2019-05-01 DIAGNOSIS — C801 Malignant (primary) neoplasm, unspecified: Secondary | ICD-10-CM | POA: Diagnosis not present

## 2019-05-01 DIAGNOSIS — I10 Essential (primary) hypertension: Secondary | ICD-10-CM | POA: Diagnosis not present

## 2019-05-01 DIAGNOSIS — Z66 Do not resuscitate: Secondary | ICD-10-CM | POA: Diagnosis not present

## 2019-05-01 DIAGNOSIS — C562 Malignant neoplasm of left ovary: Secondary | ICD-10-CM | POA: Diagnosis not present

## 2019-05-01 DIAGNOSIS — R194 Change in bowel habit: Secondary | ICD-10-CM | POA: Diagnosis not present

## 2019-05-01 DIAGNOSIS — E871 Hypo-osmolality and hyponatremia: Secondary | ICD-10-CM | POA: Diagnosis not present

## 2019-05-01 DIAGNOSIS — Z8249 Family history of ischemic heart disease and other diseases of the circulatory system: Secondary | ICD-10-CM

## 2019-05-01 DIAGNOSIS — J9600 Acute respiratory failure, unspecified whether with hypoxia or hypercapnia: Secondary | ICD-10-CM | POA: Diagnosis not present

## 2019-05-01 DIAGNOSIS — K7460 Unspecified cirrhosis of liver: Secondary | ICD-10-CM | POA: Diagnosis present

## 2019-05-01 DIAGNOSIS — K210 Gastro-esophageal reflux disease with esophagitis: Principal | ICD-10-CM | POA: Diagnosis present

## 2019-05-01 DIAGNOSIS — Z91030 Bee allergy status: Secondary | ICD-10-CM

## 2019-05-01 DIAGNOSIS — E1142 Type 2 diabetes mellitus with diabetic polyneuropathy: Secondary | ICD-10-CM | POA: Diagnosis not present

## 2019-05-01 DIAGNOSIS — K3189 Other diseases of stomach and duodenum: Secondary | ICD-10-CM | POA: Diagnosis not present

## 2019-05-01 DIAGNOSIS — I701 Atherosclerosis of renal artery: Secondary | ICD-10-CM | POA: Diagnosis present

## 2019-05-01 DIAGNOSIS — M2550 Pain in unspecified joint: Secondary | ICD-10-CM | POA: Diagnosis not present

## 2019-05-01 DIAGNOSIS — R180 Malignant ascites: Secondary | ICD-10-CM | POA: Diagnosis present

## 2019-05-01 DIAGNOSIS — R0602 Shortness of breath: Secondary | ICD-10-CM

## 2019-05-01 DIAGNOSIS — J189 Pneumonia, unspecified organism: Secondary | ICD-10-CM

## 2019-05-01 DIAGNOSIS — Z741 Need for assistance with personal care: Secondary | ICD-10-CM | POA: Diagnosis not present

## 2019-05-01 DIAGNOSIS — R1310 Dysphagia, unspecified: Secondary | ICD-10-CM

## 2019-05-01 DIAGNOSIS — I129 Hypertensive chronic kidney disease with stage 1 through stage 4 chronic kidney disease, or unspecified chronic kidney disease: Secondary | ICD-10-CM | POA: Diagnosis present

## 2019-05-01 DIAGNOSIS — M1990 Unspecified osteoarthritis, unspecified site: Secondary | ICD-10-CM | POA: Diagnosis present

## 2019-05-01 DIAGNOSIS — Z802 Family history of malignant neoplasm of other respiratory and intrathoracic organs: Secondary | ICD-10-CM

## 2019-05-01 DIAGNOSIS — Z4682 Encounter for fitting and adjustment of non-vascular catheter: Secondary | ICD-10-CM | POA: Diagnosis not present

## 2019-05-01 DIAGNOSIS — K209 Esophagitis, unspecified: Secondary | ICD-10-CM | POA: Diagnosis not present

## 2019-05-01 DIAGNOSIS — I2510 Atherosclerotic heart disease of native coronary artery without angina pectoris: Secondary | ICD-10-CM | POA: Diagnosis present

## 2019-05-01 DIAGNOSIS — E860 Dehydration: Secondary | ICD-10-CM | POA: Diagnosis present

## 2019-05-01 DIAGNOSIS — Z794 Long term (current) use of insulin: Secondary | ICD-10-CM

## 2019-05-01 DIAGNOSIS — J9601 Acute respiratory failure with hypoxia: Secondary | ICD-10-CM | POA: Diagnosis not present

## 2019-05-01 DIAGNOSIS — E1151 Type 2 diabetes mellitus with diabetic peripheral angiopathy without gangrene: Secondary | ICD-10-CM | POA: Diagnosis present

## 2019-05-01 DIAGNOSIS — E1122 Type 2 diabetes mellitus with diabetic chronic kidney disease: Secondary | ICD-10-CM | POA: Diagnosis present

## 2019-05-01 DIAGNOSIS — D649 Anemia, unspecified: Secondary | ICD-10-CM | POA: Diagnosis present

## 2019-05-01 DIAGNOSIS — K921 Melena: Secondary | ICD-10-CM | POA: Diagnosis present

## 2019-05-01 DIAGNOSIS — R971 Elevated cancer antigen 125 [CA 125]: Secondary | ICD-10-CM | POA: Diagnosis not present

## 2019-05-01 DIAGNOSIS — K8020 Calculus of gallbladder without cholecystitis without obstruction: Secondary | ICD-10-CM | POA: Diagnosis present

## 2019-05-01 DIAGNOSIS — F419 Anxiety disorder, unspecified: Secondary | ICD-10-CM | POA: Diagnosis present

## 2019-05-01 DIAGNOSIS — R900 Intracranial space-occupying lesion found on diagnostic imaging of central nervous system: Secondary | ICD-10-CM | POA: Diagnosis not present

## 2019-05-01 DIAGNOSIS — R935 Abnormal findings on diagnostic imaging of other abdominal regions, including retroperitoneum: Secondary | ICD-10-CM | POA: Diagnosis not present

## 2019-05-01 DIAGNOSIS — Z515 Encounter for palliative care: Secondary | ICD-10-CM | POA: Diagnosis not present

## 2019-05-01 DIAGNOSIS — N184 Chronic kidney disease, stage 4 (severe): Secondary | ICD-10-CM | POA: Diagnosis present

## 2019-05-01 DIAGNOSIS — R069 Unspecified abnormalities of breathing: Secondary | ICD-10-CM | POA: Diagnosis not present

## 2019-05-01 DIAGNOSIS — M5136 Other intervertebral disc degeneration, lumbar region: Secondary | ICD-10-CM | POA: Diagnosis present

## 2019-05-01 DIAGNOSIS — K7689 Other specified diseases of liver: Secondary | ICD-10-CM | POA: Diagnosis not present

## 2019-05-01 DIAGNOSIS — C786 Secondary malignant neoplasm of retroperitoneum and peritoneum: Secondary | ICD-10-CM | POA: Diagnosis not present

## 2019-05-01 DIAGNOSIS — Z0189 Encounter for other specified special examinations: Secondary | ICD-10-CM

## 2019-05-01 DIAGNOSIS — Z79899 Other long term (current) drug therapy: Secondary | ICD-10-CM

## 2019-05-01 DIAGNOSIS — K624 Stenosis of anus and rectum: Secondary | ICD-10-CM | POA: Diagnosis not present

## 2019-05-01 DIAGNOSIS — K2270 Barrett's esophagus without dysplasia: Secondary | ICD-10-CM | POA: Diagnosis present

## 2019-05-01 DIAGNOSIS — K219 Gastro-esophageal reflux disease without esophagitis: Secondary | ICD-10-CM | POA: Diagnosis not present

## 2019-05-01 DIAGNOSIS — D539 Nutritional anemia, unspecified: Secondary | ICD-10-CM | POA: Diagnosis not present

## 2019-05-01 DIAGNOSIS — Z7902 Long term (current) use of antithrombotics/antiplatelets: Secondary | ICD-10-CM

## 2019-05-01 DIAGNOSIS — J918 Pleural effusion in other conditions classified elsewhere: Secondary | ICD-10-CM | POA: Diagnosis not present

## 2019-05-01 DIAGNOSIS — I252 Old myocardial infarction: Secondary | ICD-10-CM

## 2019-05-01 DIAGNOSIS — K5790 Diverticulosis of intestine, part unspecified, without perforation or abscess without bleeding: Secondary | ICD-10-CM | POA: Diagnosis not present

## 2019-05-01 DIAGNOSIS — I70219 Atherosclerosis of native arteries of extremities with intermittent claudication, unspecified extremity: Secondary | ICD-10-CM | POA: Diagnosis not present

## 2019-05-01 DIAGNOSIS — Z87891 Personal history of nicotine dependence: Secondary | ICD-10-CM

## 2019-05-01 LAB — URINALYSIS, ROUTINE W REFLEX MICROSCOPIC
Bilirubin Urine: NEGATIVE
Glucose, UA: NEGATIVE mg/dL
Ketones, ur: NEGATIVE mg/dL
Nitrite: NEGATIVE
Protein, ur: 30 mg/dL — AB
Specific Gravity, Urine: 1.009 (ref 1.005–1.030)
pH: 6 (ref 5.0–8.0)

## 2019-05-01 LAB — SARS CORONAVIRUS 2 BY RT PCR (HOSPITAL ORDER, PERFORMED IN ~~LOC~~ HOSPITAL LAB): SARS Coronavirus 2: NEGATIVE

## 2019-05-01 LAB — CBG MONITORING, ED: Glucose-Capillary: 102 mg/dL — ABNORMAL HIGH (ref 70–99)

## 2019-05-01 LAB — LACTIC ACID, PLASMA: Lactic Acid, Venous: 1.1 mmol/L (ref 0.5–1.9)

## 2019-05-01 LAB — BRAIN NATRIURETIC PEPTIDE: B Natriuretic Peptide: 47.2 pg/mL (ref 0.0–100.0)

## 2019-05-01 LAB — COMPREHENSIVE METABOLIC PANEL
ALT: 12 U/L (ref 0–44)
AST: 16 U/L (ref 15–41)
Albumin: 2.8 g/dL — ABNORMAL LOW (ref 3.5–5.0)
Alkaline Phosphatase: 75 U/L (ref 38–126)
Anion gap: 12 (ref 5–15)
BUN: 18 mg/dL (ref 8–23)
CO2: 25 mmol/L (ref 22–32)
Calcium: 9.2 mg/dL (ref 8.9–10.3)
Chloride: 100 mmol/L (ref 98–111)
Creatinine, Ser: 2.1 mg/dL — ABNORMAL HIGH (ref 0.44–1.00)
GFR calc Af Amer: 25 mL/min — ABNORMAL LOW (ref >60)
GFR calc non Af Amer: 21 mL/min — ABNORMAL LOW (ref >60)
Glucose, Bld: 145 mg/dL — ABNORMAL HIGH (ref 70–99)
Potassium: 3.6 mmol/L (ref 3.5–5.1)
Sodium: 137 mmol/L (ref 135–145)
Total Bilirubin: 0.6 mg/dL (ref 0.3–1.2)
Total Protein: 6.1 g/dL — ABNORMAL LOW (ref 6.5–8.1)

## 2019-05-01 LAB — CBC WITH DIFFERENTIAL/PLATELET
Abs Immature Granulocytes: 0.1 10*3/uL — ABNORMAL HIGH (ref 0.00–0.07)
Basophils Absolute: 0.1 10*3/uL (ref 0.0–0.1)
Basophils Relative: 1 %
Eosinophils Absolute: 0.1 10*3/uL (ref 0.0–0.5)
Eosinophils Relative: 1 %
HCT: 34.1 % — ABNORMAL LOW (ref 36.0–46.0)
Hemoglobin: 10.9 g/dL — ABNORMAL LOW (ref 12.0–15.0)
Immature Granulocytes: 1 %
Lymphocytes Relative: 24 %
Lymphs Abs: 2.6 10*3/uL (ref 0.7–4.0)
MCH: 31.1 pg (ref 26.0–34.0)
MCHC: 32 g/dL (ref 30.0–36.0)
MCV: 97.4 fL (ref 80.0–100.0)
Monocytes Absolute: 1 10*3/uL (ref 0.1–1.0)
Monocytes Relative: 9 %
Neutro Abs: 7 10*3/uL (ref 1.7–7.7)
Neutrophils Relative %: 64 %
Platelets: 297 10*3/uL (ref 150–400)
RBC: 3.5 MIL/uL — ABNORMAL LOW (ref 3.87–5.11)
RDW: 14 % (ref 11.5–15.5)
WBC: 11 10*3/uL — ABNORMAL HIGH (ref 4.0–10.5)
nRBC: 0 % (ref 0.0–0.2)

## 2019-05-01 LAB — BODY FLUID CELL COUNT WITH DIFFERENTIAL
Lymphs, Fluid: 28 %
Monocyte-Macrophage-Serous Fluid: 46 % — ABNORMAL LOW (ref 50–90)
Neutrophil Count, Fluid: 26 % — ABNORMAL HIGH (ref 0–25)
Total Nucleated Cell Count, Fluid: 530 cu mm (ref 0–1000)

## 2019-05-01 LAB — PROTIME-INR
INR: 1.1 (ref 0.8–1.2)
Prothrombin Time: 14.5 seconds (ref 11.4–15.2)

## 2019-05-01 LAB — GRAM STAIN: Special Requests: NORMAL

## 2019-05-01 LAB — GLUCOSE, CAPILLARY: Glucose-Capillary: 58 mg/dL — ABNORMAL LOW (ref 70–99)

## 2019-05-01 LAB — TROPONIN I: Troponin I: 0.03 ng/mL (ref <0.03)

## 2019-05-01 MED ORDER — ROSUVASTATIN CALCIUM 5 MG PO TABS
10.0000 mg | ORAL_TABLET | Freq: Every day | ORAL | Status: DC
  Administered 2019-05-02 – 2019-05-07 (×6): 10 mg via ORAL
  Filled 2019-05-01 (×6): qty 2

## 2019-05-01 MED ORDER — SODIUM CHLORIDE 0.9 % IV SOLN: 250.0000 mL | INTRAVENOUS | Status: DC | PRN

## 2019-05-01 MED ORDER — ACETAMINOPHEN 650 MG RE SUPP: 650.0000 mg | Freq: Four times a day (QID) | RECTAL | Status: DC | PRN

## 2019-05-01 MED ORDER — LACTATED RINGERS IV BOLUS
1000.0000 mL | Freq: Once | INTRAVENOUS | Status: AC
  Administered 2019-05-01: 1000 mL via INTRAVENOUS

## 2019-05-01 MED ORDER — LIDOCAINE 5 % EX PTCH
1.0000 | MEDICATED_PATCH | CUTANEOUS | Status: DC
  Administered 2019-05-02 – 2019-05-08 (×7): 1 via TRANSDERMAL
  Filled 2019-05-01 (×7): qty 1

## 2019-05-01 MED ORDER — DEXTROSE 50 % IV SOLN
INTRAVENOUS | Status: AC
  Administered 2019-05-02: 25 mL
  Filled 2019-05-01: qty 50

## 2019-05-01 MED ORDER — INSULIN ASPART 100 UNIT/ML ~~LOC~~ SOLN
0.0000 [IU] | Freq: Three times a day (TID) | SUBCUTANEOUS | Status: DC
  Administered 2019-05-03: 1 [IU] via SUBCUTANEOUS

## 2019-05-01 MED ORDER — BUPROPION HCL ER (XL) 150 MG PO TB24
150.0000 mg | ORAL_TABLET | Freq: Every day | ORAL | Status: DC
  Administered 2019-05-02 – 2019-05-08 (×7): 150 mg via ORAL
  Filled 2019-05-01 (×7): qty 1

## 2019-05-01 MED ORDER — CARVEDILOL 12.5 MG PO TABS
12.5000 mg | ORAL_TABLET | Freq: Two times a day (BID) | ORAL | Status: DC
  Administered 2019-05-02 – 2019-05-08 (×12): 12.5 mg via ORAL
  Filled 2019-05-01 (×12): qty 1

## 2019-05-01 MED ORDER — LIDOCAINE HCL 2 % IJ SOLN
10.0000 mL | Freq: Once | INTRAMUSCULAR | Status: AC
  Administered 2019-05-01: 19:00:00 200 mg via INTRADERMAL
  Filled 2019-05-01: qty 20

## 2019-05-01 MED ORDER — ONDANSETRON HCL 4 MG/2ML IJ SOLN
4.0000 mg | Freq: Once | INTRAMUSCULAR | Status: AC
  Administered 2019-05-01: 4 mg via INTRAVENOUS
  Filled 2019-05-01: qty 2

## 2019-05-01 MED ORDER — SODIUM CHLORIDE 0.9% FLUSH
3.0000 mL | Freq: Two times a day (BID) | INTRAVENOUS | Status: DC
  Administered 2019-05-02: 09:00:00 3 mL via INTRAVENOUS

## 2019-05-01 MED ORDER — SODIUM CHLORIDE 0.9% FLUSH
3.0000 mL | Freq: Two times a day (BID) | INTRAVENOUS | Status: DC
  Administered 2019-05-01 – 2019-05-03 (×4): 3 mL via INTRAVENOUS

## 2019-05-01 MED ORDER — PANTOPRAZOLE SODIUM 40 MG PO TBEC
40.0000 mg | DELAYED_RELEASE_TABLET | Freq: Every day | ORAL | Status: DC
  Administered 2019-05-02 – 2019-05-06 (×5): 40 mg via ORAL
  Filled 2019-05-01 (×5): qty 1

## 2019-05-01 MED ORDER — ACETAMINOPHEN 325 MG PO TABS: 650.0000 mg | ORAL_TABLET | Freq: Four times a day (QID) | ORAL | Status: DC | PRN

## 2019-05-01 MED ORDER — SODIUM CHLORIDE 0.9% FLUSH: 3.0000 mL | INTRAVENOUS | Status: DC | PRN

## 2019-05-01 MED ORDER — CLOPIDOGREL BISULFATE 75 MG PO TABS
75.0000 mg | ORAL_TABLET | Freq: Every day | ORAL | Status: DC
  Administered 2019-05-02: 75 mg via ORAL
  Filled 2019-05-01: qty 1

## 2019-05-01 MED ORDER — MORPHINE SULFATE (PF) 4 MG/ML IV SOLN
4.0000 mg | Freq: Once | INTRAVENOUS | Status: AC
  Administered 2019-05-01: 19:00:00 4 mg via INTRAVENOUS
  Filled 2019-05-01: qty 1

## 2019-05-01 MED ORDER — TRAMADOL HCL 50 MG PO TABS
50.0000 mg | ORAL_TABLET | Freq: Four times a day (QID) | ORAL | Status: DC | PRN
  Administered 2019-05-02 (×2): 50 mg via ORAL
  Filled 2019-05-01 (×2): qty 1

## 2019-05-01 MED ORDER — ENOXAPARIN SODIUM 30 MG/0.3ML ~~LOC~~ SOLN
30.0000 mg | SUBCUTANEOUS | Status: DC
  Administered 2019-05-02 – 2019-05-05 (×4): 30 mg via SUBCUTANEOUS
  Filled 2019-05-01 (×4): qty 0.3

## 2019-05-01 NOTE — ED Triage Notes (Signed)
Pt stated that she she has not had a bowel movement in a week & she feels nauseas & intermitently vomits. She states to have a pressure feeling in her chest as well as acid reflux & her PCP was the one that wanted to to come to ED for evaluation.

## 2019-05-01 NOTE — H&P (Addendum)
[facility] Admission History and Physical Service Pager: [phone]  Patient name: Mary Harding Medical record number: [id number] Date of birth: 03-23-1934 Age: 85 y.o. Gender: female  Primary Care Provider: Deland Pretty, MD Consultants: None Code Status: Full Code Emergency Contact: Ellese Julius [phone]  Chief Complaint: Constipation, nausea, vomiting  Assessment and Plan: Mary Harding is a 85 y.o. female presenting with consipation, nausea an dintermittint vomiting. PMH is significant for CAD, HTN, HLD, T2DM, CKD IV, anxiety/depression, arthritis, OSA, peripheral neuropathy, and PAD.  Liver Nodules  Abdominal Ascites: Acute, stable Differential includes SBP vs Malignancy vs Cirrhosis vs CHF. CHF unlikely as she has no LE edema, no SOB, normal BNP, although no echo in Epic. Cirrhosis is less likely given no recent infectious process and the acuity with which her issues began (2 weeks). Could consider obtaining a hepatitis panel if paracentesis is unrevealing. SBP less likely as she has no abdominal pain, no fever, mild leukocytosis. Malignancy unfortunately is the most likely cause of her new onset dysphagia and ascities. CT also notable for Mildly nodular appearance to the liver concerning for cirrhosis and moderate to large volume of abdominal ascites with recommendation for fluid analysis. ED performed diagnostic paracentesis with culture, cytology, and gram stain. Patient denies any history of liver disease or RUQ pain. Denies any significant alcohol use (social drinker when younger). Possible fluid wave notable on exam. Liver enzymes, alk phos, PT/INR and bilirubin all WNL.  - admit to med-tele, FPTS, attending Dr. Ardelia Mems - S/p Zofran  - Follow up FOBT - continuous cardiac monitoring x 24 hours - AM CBC, CMP, A1C, lipid panel - Follow up CA 125 - SLP for swallow study - NPO until swallow study; sips with meds as she has been able to do this at  home - Lovenox adjusted for AKI - consult GI in AM, consider colonoscopy. Will also benefit from EDG due to CT Abd findings of dilated and fluid around lower esophagus and wall thickening of the gastric fundus and body.  - Consult Oncology - consult GI in AM - follow up paracentesis labs - follow up complete abdominal ultrasound  Constipation, Narrow Stools, Melena: acute, stable One week history of constipation with intermittent narrow stools. CT abdomen/pelvis significant for moderate amount stool burden throughout the colon without evidence of small bowel obstruction or diverticulitis cholelithiasis.  She notes dark stools although she is on iron chronically  Denies any weight loss or bright red blood per rectum.  Denies any abdominal pain or pain with bowel movements.  Differential includes fecal impaction versus colonic neoplasm.  Unlikely diverticulitis or bowel obstruction given CT results.  Appears more acute thus making IBS or Crohn's more unlikely. Patient endorsed having a normal colonoscopy in the past, although no results in chart. KUB with contrast performed in 2017 notable for extensive diverticulosis without diverticulitis with difficulty excluding polyploid lesions given the innumerable diverticula.  In the ED, patient was hemodynamically stable and afebrile. Patient has chronic normocytic anemia and Hgb is at baseline. FOBT collected. Zofran x 1 for nausea. Overall physical exam unremarkable with firm abdomen with only mild tenderness to LLQ. Rectal exam with notable external hemorrhoid, otherwise unremarkable. Stool soft and normal appearing.  - vitals per floor  - Considered started bowel regimen given moderate stool burden on CT, however opted to await GI recs. If no planned procedures, recommend starting bowel regimen. - consult GI in AM, consider colonoscopy - Follow up FOBT  Dysphagia: acute, worsening Endorsed  1 month history of dysphagia to both solids and liquids with daily  vomitus of food eaten. Notes food gets stuck mid-sternum. Denies any weight loss. CT abd/pelvis notable for wall thickening of the gastric body and fundus of unknown clinical significance and fluid dilation of the distal esophagus with recommendation for EGD. History of GERD on Protonix. Some concern for esophageal stricture vs neoplasm. Denies any pain. - SLP for swallow eval - NPO until swallow study - Consult GI for possible EGD  Bibasilar Lung Opacities: Concern for atelectasis vs PNA on CXR. CT abd/pelvis notable for large bilateral pleural effusions. Breath sounds appreciated throughout without any obvious crackles. Denies any fevers, chills, cough, sputum production, or worsening SOB. On admission she was hemodynamically stable on room air and afebrile with no leukocytosis. Unlikely PNA given clinical picture. She does not appear to be fluid overloaded on exam. Does not appear she has CHF but no echo in chart. - continue to monitor - If respiratory status deteriorates or develops a fever, recommend repeat CXR, blood culture, and antibiotics.  Normocytic Anemia: stable Hgb 10.9 (Baseline 10). MCV 97.4. No iron panel in chart. Home meds: Iron 3647m. Does endorse darker stool which would be consistent with iron vs upper GI bleed. Differential includes upper GI bleed vs iron deficiency anemia vs anemia of CKD. Denies any active bleeding. - continue home iron supplement - follow up iron studies - follow up FOBT  AKI on CKD IV: Per chart review, patient had abdominal arteriogram in 09/2018 that revealed presence of 2 renal arteries on the right and one renal on the left, left renal artery ostium had severe calcification and 20 to 30% stenosis. Diffuse disease of the right renal arteries. Creatinine on admission 2.10 (baseline 1.0 in 2015). BUN 18. This may be new baseline for patient given T2DM, CAD, and HLD. - avoid nephrotoxic agents  - continue to monitor   Elevated Troponin: Trop 0.03 on  admission. Denies any current chest pain, but does endorse intermittent sharp substernal chest pain that occurs at rest and relieved with nitroglycerin. She does have a history of CAD with PCI in 2015 showing stable stenosis, the worst being 70-80% stenosis in the circumflex coronary artery. - EKG - Trend trops, now then q6 hours  Enlarged Ovary  Thickened Endometrial Stripe: Enlarged left ovary measuring 3 x 4.1 cm and diffuse thickening of the endometrial stripe on CT abd/pelvis. Denies pelvic pain or AUB. - follow up transvaginal and pelvic ultrasound  H/o CAD  PAD  Renal Artery Stenosis: Had PCI in 2015 which showed stable Circumflex coronary artery stenosis which cardiology opted to treat medically with aggressive risk modification. She had angioplasty to he right SFA in 09/2018. Home meds: Plavix 726mand Coreg 12.47m9mID. In regards to her Plavix, she is instructed to take it daily, but patient reports taking it 3 times per week because of reported bleeding and finger discoloration with CBG finger sticks. - continue home meds  HTN:  BP on admission 154/77. Home meds: Coreg 12.47mg93mD. Denies any headaches or vision changes. - continue home meds - continue to monitor  HLD: Last lipid panel notable in chart was 2015. Home meds: Crestor 10mg23m- continue home meds - follow up lipid panel  T2DM:  No hemoglobin A1C in chart. Home meds: Liragluitide 1.8mg Q27mLantus 24U QD, Novolog Sliding scale TID. Did not have Lantus morning of admission. - sSSI - CBGs q4 hours while NPO - consider starting Lantus pending CBG's -  follow up A1C  GERD:  Home med: Protonix 239m QD - continue Protonix 458mQD  Chronic back pain: MRI of lumbar spine 12/2018 notable for lumbar spondylosis and degenerative disc disease causing impingement to L3-S1, sacral Tarlov (perineural) cyst to right of S1 level, 39m72megenerative anterolisthesis to right of S1, and facet joint effusions at L3-L5 bilaterally.  Per patient, had fractured multiple lumbar vertebra 40-50 years ago. Has been treated with steroid injections periodicially. Home meds: Tramadol 72m81mhrs. She notes it does not really help. - continue home meds - Lidocaine patch and K-pad  Anxiety/Depression: Home meds: Wellbutrin 172mg86m Denies any SI/HI or auditory/visual hallucinations. - continue home meds  FEN/GI: NPO until swallow eval, Sips with meds Prophylaxis: Loveox  Disposition: Pending workup  History of Present Illness:  Mary Harding 84 y.33 female presenting with constipation without a bowel movement x 6 days. She notes her BM's thin x 1 week. She notes that her stools are dark green but she is on iron and this is unchanged. Denies any abdominal pain or weight loss.  She also endorses trouble swallowing both foods and liquids, gets "lodged" in mid sternum. She has been eating soft scrambled eggs for >1 month. She has to force her food back up when it gets stuck. She notes she has been vomiting x 1 month. She gets the urge to vomit after each meal and sometimes has to force herself to vomit. Denies any blood in her vomit.  She also endorses some mid lower back pain, which is chronic for patient, but has been hurting more. She saw PCP lst week and was placed on Prednisone which helped. She notes the pain came back so she went to see PCP this week and had CT abdomen/pelvis performed today. Her PCP called her back with the results and told her to come to the ED.   She notes she has been sleeping a lot more. More unsteady on her feet. She has intermittent sharp sub-sternal chest pain that is relieved with nitroglycerin. She notes it occurs when sitting, does not occur with exertion. Denies any current chest pain. Started having some sharp left upper back pain starting today. Has chronic SOB at baseline which has not worsened. She also notes being more tired, having less energy, and feels like her memory is worsening.  Denies  any pelvic pain, vaginal bleeding, fever, chills, cough, congestion, sore throat, orthopnea, leg swelling, dysuria, hematuria, urgency, frequency, dizziness, HA, focal weakness.   Denies any current alcohol use. Drank alcohol when younger (early 20's), socially. Denies any illicit drug use. History of tobacco use, smoked 1/2pack/day x starting at 85yo, quite ~20 years ago.   Family history: Sister died of lung cancer (early 50's)76'sther had sinus cancer.   Last colonoscopy 20-30 years ago. Partial colonoscopy 7-8 years ago. As far as patient knows results were negative. She had KUB with contrast in 2017 notable for "Exuberant diverticulosis without evidence of diverticulitis. No annular constricting lesions are observed. Certainly polypoid lesions cannot be absolutely excluded given the innumerable diverticula"  In the ED, she was hemodynamically stable and afebrile. Labs significant for CBC with WBC 11, Hgb 10.9, MCV 97.4, Plt 297. CMP with normal electrolytes, Cr 2.10, BUN 18, AST/ALT/Alk phos and bilirubin WNL. Trop 0.03. PT/INR  14.5/1.1. BNP 47.2. Lactic Acid 1.1. She received 1L LR bolus and Zofran x 1. She had FOBT and bedside paracentesis performed with body fluid culture, cytology, and gram stain, as well as  CA 125. Patient was admitted for further work up.   Review Of Systems: Per HPI with the following additions:   Review of Systems  Constitutional: Positive for malaise/fatigue. Negative for chills, fever and weight loss.  HENT: Negative for congestion and sore throat.   Respiratory: Positive for shortness of breath (chronic). Negative for cough, hemoptysis and sputum production.   Cardiovascular: Positive for chest pain. Negative for orthopnea and leg swelling.  Gastrointestinal: Positive for constipation, melena, nausea and vomiting. Negative for abdominal pain, blood in stool and diarrhea.  Genitourinary: Negative for dysuria, flank pain, frequency, hematuria and urgency.   Musculoskeletal: Positive for back pain.  Neurological: Positive for weakness. Negative for dizziness, focal weakness and headaches.  Psychiatric/Behavioral: Negative for hallucinations.    Patient Active Problem List   Diagnosis Date Noted  . Pseudoaneurysm following procedure ([place]) 10/25/2018  . PAD (peripheral artery disease) ([place]) 10/01/2018  . Claudication in peripheral vascular disease ([place]) 09/30/2018  . Uncontrolled type 2 diabetes mellitus with hyperglycemia ([place]) 04/08/2018  . Hyperlipidemia 01/18/2017  . Urinary tract infection 01/18/2017  . Type II or unspecified type diabetes mellitus with renal manifestations, not stated as uncontrolled(250.40) 04/10/2014  . Type II or unspecified type diabetes mellitus without mention of complication, uncontrolled 04/10/2014  . Coronary atherosclerosis of native coronary artery 04/10/2014  . Unstable angina pectoris ([place]) 04/08/2014    Past Medical History: Past Medical History:  Diagnosis Date  . Anxiety   . Arthritis    mild  . Blood transfusion 1960s   "related to having babies" (10/01/2018)  . Chronic lower back pain    hx ruptured disc;cyst on nerve ending  . CKD (chronic kidney disease), stage IV ([place])   . Coronary artery disease   . Depression   . Edema extremities    pt takes Torsemide daily  . Family history of adverse reaction to anesthesia    "I've had several people die from anesthesia in my family; malignant hyperthermia" (10/01/2018)  . GERD (gastroesophageal reflux disease)    takes Protonix daily  . Heart murmur   . History of shingles    73yr ago  . Hyperlipidemia    takes Crestor daily  . Hypertension    takes Amlodipine daily  . Nocturia   . Peripheral neuropathy    left  . Sciatica    left side  . Shortness of breath    with exertion  . Sleep apnea    sleep study done in 2012;used CPAP "in the past; not anymore" (10/01/2018)  . Type II diabetes mellitus ([place]    takes  Glimepiride,Metformin,and Victoza;Lantus bid  . Urinary frequency    takes Detrol daily    Past Surgical History: Past Surgical History:  Procedure Laterality Date  . ABDOMINAL AORTOGRAM N/A 10/01/2018   Procedure: ABDOMINAL AORTOGRAM;  Surgeon: GAdrian Prows MD;  [facility];  Service: Cardiovascular;  Laterality: N/A;  . CATARACT EXTRACTION W/PHACO  02/14/2012   Procedure: CATARACT EXTRACTION PHACO AND INTRAOCULAR LENS PLACEMENT (IOC);  Surgeon: GAdonis Brook MD;  [place]  Service: Ophthalmology;  Laterality: Left;  . COLONOSCOPY    . DILATION AND CURETTAGE OF UTERUS    . ESOPHAGOGASTRODUODENOSCOPY    . EYE SURGERY    . LEFT HEART CATHETERIZATION WITH CORONARY ANGIOGRAM N/A 04/09/2014   Procedure: LEFT HEART CATHETERIZATION WITH CORONARY ANGIOGRAM;  Surgeon: JLaverda Page MD;  [facility];  Service: Cardiovascular;  Laterality: N/A;  . LOWER EXTREMITY ANGIOGRAPHY Bilateral 10/01/2018  Procedure: LOWER EXTREMITY ANGIOGRAPHY;  Surgeon: Adrian Prows, MD;  Location: [place] [facility];  Service: Cardiovascular;  Laterality: Bilateral;  . LYMPH NODE DISSECTION Left 1970s   "neck"  . PARS PLANA VITRECTOMY  02/14/2012   Procedure: PARS PLANA VITRECTOMY WITH 23 GAUGE;  Surgeon: Adonis Brook, MD;  Location: [facility];  Service: Ophthalmology;  Laterality: Left;  . PERIPHERAL VASCULAR ATHERECTOMY Right 10/01/2018   SFA WITH DRUG COATED BALLOON  . PERIPHERAL VASCULAR ATHERECTOMY Right 10/01/2018   Procedure: PERIPHERAL VASCULAR ATHERECTOMY;  Surgeon: Adrian Prows, MD;  Location: [place] [facility];  Service: Cardiovascular;  Laterality: Right;  SFA WITH DRUG COATED BALLOON  . REFRACTIVE SURGERY    . TUBAL LIGATION      Social History: Social History   Tobacco Use  . Smoking status: Former Smoker    Packs/day: 1.00    Years: 15.00    Pack years: 15.00    Types: Cigarettes    Last attempt to quit: 01/18/1997    Years since quitting: 22.2  . Smokeless  tobacco: Never Used  Substance Use Topics  . Alcohol use: Not Currently  . Drug use: Never   Additional social history: History of drinking socially in 20's, but denies any current or recent history of alcohol use.  Denies any illicit drug use. History of tobacco use, smoked 1/2pack/day x starting at 85yo, quite ~20 years ago. Please also refer to relevant sections of EMR.  Family History: Family History  Problem Relation Age of Onset  . Diabetes Mother   . Dementia Mother   . Early death Father   . Diabetes Sister   . Heart disease Sister   . Heart disease Maternal Grandfather   . Anesthesia problems Neg Hx   . Hypotension Neg Hx   . Malignant hyperthermia Neg Hx   . Pseudochol deficiency Neg Hx    Allergies and Medications: Allergies  Allergen Reactions  . Bee Venom Anaphylaxis  . Other     general anesthesia- Malignant hypothermia    No current facility-administered medications on file prior to encounter.    Current Outpatient Medications on File Prior to Encounter  Medication Sig Dispense Refill  . aspirin 81 MG tablet Take 1 tablet (81 mg total) by mouth daily. 30 tablet   . buPROPion (WELLBUTRIN XL) 150 MG 24 hr tablet Take 150 mg by mouth daily.  11  . carvedilol (COREG) 12.5 MG tablet Take 12.5 mg by mouth 2 (two) times daily with a meal.    . clopidogrel (PLAVIX) 75 MG tablet Take 1 tablet (75 mg total) by mouth daily with breakfast. 30 tablet 3  . diclofenac sodium (VOLTAREN) 1 % GEL Apply 1 application topically 4 (four) times daily as needed (pain).    Marland Kitchen EPINEPHrine (EPIPEN) 0.3 mg/0.3 mL SOAJ injection Inject 0.3 mg into the muscle once as needed (for bee stings).    . ferrous sulfate 325 (65 FE) MG tablet Take 325 mg by mouth 2 (two) times daily.    . furosemide (LASIX) 20 MG tablet Take 20 mg by mouth daily.    . insulin aspart (NOVOLOG) 100 UNIT/ML injection Inject 3-10 Units into the skin 3 (three) times daily before meals.     . insulin glargine (LANTUS)  100 UNIT/ML injection Inject 27 Units into the skin daily.     . Liraglutide (VICTOZA) 18 MG/3ML SOLN Inject 1.8 mg into the skin daily.     . Multiple Vitamins-Minerals (MULTIVITAMIN WITH MINERALS) tablet Take 1 tablet  by mouth daily.    . Naphazoline HCl (CLEAR EYES OP) Place 1 drop into both eyes daily as needed (dry eyes).    . nitroGLYCERIN (NITROSTAT) 0.4 MG SL tablet Place 0.4 mg under the tongue every 5 (five) minutes as needed for chest pain.    . pantoprazole (PROTONIX) 40 MG tablet Take 40 mg by mouth daily.    . potassium chloride (K-DUR,KLOR-CON) 10 MEQ tablet Take 10 mEq by mouth daily.    . rosuvastatin (CRESTOR) 10 MG tablet Take 10 mg by mouth daily.    . traMADol (ULTRAM) 50 MG tablet Take 50 mg by mouth every 6 (six) hours as needed for moderate pain.      Objective: BP (!) 147/80   Pulse 98   Temp 98.4 F (36.9 C) (Oral)   Resp 20   Ht 5' 6"  (1.676 m)   SpO2 97%   BMI 27.44 kg/m  Exam: General: Pleasant elderly African-American female, well nourished, well developed, in no acute distress with non-toxic appearance, eating comfortably in ED bed HEENT: normocephalic, atraumatic, moist mucous membranes Neck: supple, normal ROM CV: regular rate and rhythm without murmurs, rubs, or gallops, no lower extremity edema, 2+ radial and pedal pulses bilaterally Lungs: clear to auscultation bilaterally with normal work of breathing on RA, no crackles apprecaited Abdomen: firm, mild tenderness to LLQ, non-distended, normoactive bowel sounds, possible fluid wave appreciated Skin: warm, dry, no rashes or lesions Extremities: warm and well perfused, normal tone Neuro: Alert and orientedx3, speech normal Rectal exam: negative without mass, lesions or tenderness, no tenderness noted, external hemorrhoids noted. Stool soft and brown in color.  Labs and Imaging: CBC BMET  Recent Labs  Lab 05/01/19 1714  WBC 11.0*  HGB 10.9*  HCT 34.1*  PLT 297   Recent Labs  Lab  05/01/19 1714  NA 137  K 3.6  CL 100  CO2 25  BUN 18  CREATININE 2.10*  GLUCOSE 145*  CALCIUM 9.2     Trop 0.03 PT/INR  14.5/1.1 BNP 47.2 Lactic Acid 1.1  Ct Abdomen Pelvis Wo Contrast  Result Date: 05/01/2019 CLINICAL DATA:  Abdominal pain. Constipation x5 days with nausea and vomiting. EXAM: CT ABDOMEN AND PELVIS WITHOUT CONTRAST TECHNIQUE: Multidetector CT imaging of the abdomen and pelvis was performed following the standard protocol without IV contrast. COMPARISON:  None. FINDINGS: Lower chest: There are moderate to large bilateral pleural effusions. Bibasilar atelectasis is noted. Calcifications are noted of the mitral valve and coronary arteries. Atherosclerotic changes are noted of the thoracic aorta. Hepatobiliary: The liver contour appears somewhat nodular. There is no discrete hepatic mass, however evaluation is significantly limited in the absence of IV contrast. There is cholelithiasis without definite CT evidence of acute cholecystitis. The common bile duct is not well appreciated but there does not appear to be overt biliary ductal dilatation. Pancreas: Unremarkable. No pancreatic ductal dilatation or surrounding inflammatory changes. Spleen: Normal in size without focal abnormality. Adrenals/Urinary Tract: No hydronephrosis. No radiopaque kidney stones. The bladder is decompressed which limits evaluations. The adrenal glands are unremarkable. Stomach/Bowel: There is scattered colonic diverticula without CT evidence of diverticulitis. There is a moderate amount of stool in the colon. The appendix is located in the right lower quadrant and is unremarkable. There is no bowel obstruction. There appears to be some tethering of the small bowel loops, which are mostly centrally located. There appears to be diffuse wall thickening of the gastric body and fundus. The esophagus is dilated and fluid-filled. There are  few low-attenuation cystic structures in the upper abdomen (axial series 2,  image 39) which are not well evaluated. These could represent mildly dilated loops of small bowel. Vascular/Lymphatic: Aortic atherosclerosis. No enlarged abdominal or pelvic lymph nodes. Reproductive: The left ovary appears enlarged for the patient's age measuring approximately 4.1 by 3 cm. The right ovary is unremarkable. There appears to be some diffuse thickening of the endometrial canal, however this is suboptimally evaluated secondary to lack of IV contrast. Other: There is a large volume of abdominal ascites. There is some haziness of the mesentery. There are no definite peritoneal implants, however evaluation is significantly limited by lack of IV contrast. Musculoskeletal: No acute or significant osseous findings. IMPRESSION: 1. Moderate to large bilateral pleural effusions. 2. Moderate to large volume of abdominal ascites. There is some haziness of the omentum and mesentery without definite peritoneal implants. However, evaluation is limited by lack of IV contrast. Fluid evaluation of the ascites is recommended to help exclude a malignant cause. 3. Mildly nodular appearance to the liver which can be seen in patients with cirrhosis. 4. Cholelithiasis without CT evidence of acute cholecystitis. 5. Wall thickening of the gastric body and fundus of unknown clinical significance. In addition, there is fluid dilation of the distal esophagus. Further evaluation with EGD should be considered. 6. Enlarged left ovary measuring 3 x 4.1 cm. Further evaluation with ultrasound is recommended. In addition, there appears to be diffuse thickening of the endometrial stripe. This should be further evaluated with ultrasound. 7. Moderate amount of stool throughout the colon. No evidence of a small-bowel obstruction. No CT evidence of diverticulitis. Aortic Atherosclerosis (ICD10-I70.0). Electronically Signed   By: Constance Holster M.D.   On: 05/01/2019 13:42   Dg Chest Portable 1 View  Result Date: 05/01/2019 CLINICAL  DATA:  Dyspnea EXAM: PORTABLE CHEST 1 VIEW COMPARISON:  04/08/2014 chest radiograph. FINDINGS: Stable cardiomediastinal silhouette with normal heart size. No pneumothorax. Small bilateral pleural effusions. No pulmonary edema. Hazy right parahilar and bibasilar lung opacities. IMPRESSION: 1. Small bilateral pleural effusions. 2. Hazy right parahilar and bibasilar lung opacities, which could represent atelectasis and/or pneumonia. Electronically Signed   By: Ilona Sorrel M.D.   On: 05/01/2019 17:28     Danna Hefty, DO 05/01/2019, 7:48 PM PGY-1, [facility] pager: [phone], text pages welcome  Resident Attestation   I saw and evaluated the patient, performing the key elements of the service.I  personally performed or re-performed the history, physical exam, and medical decision making activities of this service and have verified that the service and findings are accurately documented in the resident's note. I developed the management plan that is described in the resident's note, and I agree with the content, with my edits above in red.   Harolyn Rutherford, DO [facility], PGY-2

## 2019-05-01 NOTE — Progress Notes (Signed)
Hypoglycemic Event  CBG: 58    Treatment: D50 25 mL (12.5 gm)  Symptoms: None  Follow-up CBG: Time:0043 CBG Result:133  Possible Reasons for Event: No meal intake    Comments/MD notified   [place] N Vincenza Dail

## 2019-05-01 NOTE — ED Notes (Signed)
Attempted report 

## 2019-05-01 NOTE — ED Notes (Signed)
ED TO INPATIENT HANDOFF REPORT  ED Nurse Name and Phone #: Suezanne Jacquet [phone]  S Name/Age/Gender Mary Harding 84 y.o. female Room/Bed: 019C/019C  Code Status   Code Status: Prior  Home/SNF/Other Home Patient oriented to: self, place, time and situation Is this baseline? Yes   Triage Complete: Triage complete  Chief Complaint constipation/chest pressure   Triage Note Pt stated that she she has not had a bowel movement in a week & she feels nauseas & intermitently vomits. She states to have a pressure feeling in her chest as well as acid reflux & her PCP was the one that wanted to to come to ED for evaluation.    Allergies Allergies  Allergen Reactions  . Bee Venom Anaphylaxis  . Other Other (See Comments)    general anesthesia- Malignant hypothermia     Level of Care/Admitting Diagnosis ED Disposition    ED Disposition Condition Comment   Admit  The patient appears reasonably stabilized for admission considering the current resources, flow, and capabilities available in the ED at this time, and I doubt any other [facility] requiring further screening and/or treatment in the ED prior to admission is  present.       B Medical/Surgery History Past Medical History:  Diagnosis Date  . Anxiety   . Arthritis    mild  . Blood transfusion 1960s   "related to having babies" (10/01/2018)  . Chronic lower back pain    hx ruptured disc;cyst on nerve ending  . CKD (chronic kidney disease), stage IV ([place])   . Coronary artery disease   . Depression   . Edema extremities    pt takes Torsemide daily  . Family history of adverse reaction to anesthesia    "I've had several people die from anesthesia in my family; malignant hyperthermia" (10/01/2018)  . GERD (gastroesophageal reflux disease)    takes Protonix daily  . Heart murmur   . History of shingles    25yrs ago  . Hyperlipidemia    takes Crestor daily  . Hypertension    takes Amlodipine daily  . Nocturia   . Peripheral  neuropathy    left  . Sciatica    left side  . Shortness of breath    with exertion  . Sleep apnea    sleep study done in 2012;used CPAP "in the past; not anymore" (10/01/2018)  . Type II diabetes mellitus ([place])    takes Glimepiride,Metformin,and Victoza;Lantus bid  . Urinary frequency    takes Detrol daily   Past Surgical History:  Procedure Laterality Date  . ABDOMINAL AORTOGRAM N/A 10/01/2018   Procedure: ABDOMINAL AORTOGRAM;  Surgeon: Adrian Prows, MD;  Location: [place] [facility];  Service: Cardiovascular;  Laterality: N/A;  . CATARACT EXTRACTION W/PHACO  02/14/2012   Procedure: CATARACT EXTRACTION PHACO AND INTRAOCULAR LENS PLACEMENT (IOC);  Surgeon: Adonis Brook, MD;  Location: [place];  Service: Ophthalmology;  Laterality: Left;  . COLONOSCOPY    . DILATION AND CURETTAGE OF UTERUS    . ESOPHAGOGASTRODUODENOSCOPY    . EYE SURGERY    . LEFT HEART CATHETERIZATION WITH CORONARY ANGIOGRAM N/A 04/09/2014   Procedure: LEFT HEART CATHETERIZATION WITH CORONARY ANGIOGRAM;  Surgeon: [name], MD;  Location: [facility] CATH [facility];  Service: Cardiovascular;  Laterality: N/A;  . LOWER EXTREMITY ANGIOGRAPHY Bilateral 10/01/2018   Procedure: LOWER EXTREMITY ANGIOGRAPHY;  Surgeon: Adrian Prows, MD;  Location: [place] [facility];  Service: Cardiovascular;  Laterality: Bilateral;  . LYMPH NODE DISSECTION Left 1970s   "  neck"  . PARS PLANA VITRECTOMY  02/14/2012   Procedure: PARS PLANA VITRECTOMY WITH 23 GAUGE;  Surgeon: Adonis Brook, MD;  Location: [place];  Service: Ophthalmology;  Laterality: Left;  . PERIPHERAL VASCULAR ATHERECTOMY Right 10/01/2018   SFA WITH DRUG COATED BALLOON  . PERIPHERAL VASCULAR ATHERECTOMY Right 10/01/2018   Procedure: PERIPHERAL VASCULAR ATHERECTOMY;  Surgeon: Adrian Prows, MD;  Location: [place] [facility];  Service: Cardiovascular;  Laterality: Right;  SFA WITH DRUG COATED BALLOON  . REFRACTIVE SURGERY    . TUBAL LIGATION       A IV Location/Drains/Wounds Patient  Lines/Drains/Airways Status   Active Line/Drains/Airways    Name:   Placement date:   Placement time:   Site:   Days:   Peripheral IV 05/01/19 Left Forearm   05/01/19    1933    Forearm   less than 1          Intake/Output Last 24 hours No intake or output data in the 24 hours ending 05/01/19 2126  Labs/Imaging Results for orders placed or performed during the hospital encounter of 05/01/19 (from the past 48 hour(s))  CBC with Differential     Status: Abnormal   Collection Time: 05/01/19  5:14 PM  Result Value Ref Range   WBC 11.0 (H) 4.0 - 10.5 K/uL   RBC 3.50 (L) 3.87 - 5.11 MIL/uL   Hemoglobin 10.9 (L) 12.0 - 15.0 g/dL   HCT 34.1 (L) 36.0 - 46.0 %   MCV 97.4 80.0 - 100.0 fL   MCH 31.1 26.0 - 34.0 pg   MCHC 32.0 30.0 - 36.0 g/dL   RDW 14.0 11.5 - 15.5 %   Platelets 297 150 - 400 K/uL   nRBC 0.0 0.0 - 0.2 %   Neutrophils Relative % 64 %   Neutro Abs 7.0 1.7 - 7.7 K/uL   Lymphocytes Relative 24 %   Lymphs Abs 2.6 0.7 - 4.0 K/uL   Monocytes Relative 9 %   Monocytes Absolute 1.0 0.1 - 1.0 K/uL   Eosinophils Relative 1 %   Eosinophils Absolute 0.1 0.0 - 0.5 K/uL   Basophils Relative 1 %   Basophils Absolute 0.1 0.0 - 0.1 K/uL   Immature Granulocytes 1 %   Abs Immature Granulocytes 0.10 (H) 0.00 - 0.07 K/uL    Comment: Performed at [facility], [address] [place], [place] [address]  Comprehensive metabolic panel     Status: Abnormal   Collection Time: 05/01/19  5:14 PM  Result Value Ref Range   Sodium 137 135 - 145 mmol/L   Potassium 3.6 3.5 - 5.1 mmol/L   Chloride 100 98 - 111 mmol/L   CO2 25 22 - 32 mmol/L   Glucose, Bld 145 (H) 70 - 99 mg/dL   BUN 18 8 - 23 mg/dL   Creatinine, Ser 2.10 (H) 0.44 - 1.00 mg/dL   Calcium 9.2 8.9 - 10.3 mg/dL   Total Protein 6.1 (L) 6.5 - 8.1 g/dL   Albumin 2.8 (L) 3.5 - 5.0 g/dL   AST 16 15 - 41 U/L   ALT 12 0 - 44 U/L   Alkaline Phosphatase 75 38 - 126 U/L   Total Bilirubin 0.6 0.3 - 1.2 mg/dL   GFR calc non Af Amer 21  (L) >60 mL/min   GFR calc Af Amer 25 (L) >60 mL/min   Anion gap 12 5 - 15    Comment: Performed at [facility], [place],  [place] [address]  Troponin I - ONCE - STAT     Status: Abnormal   Collection Time: 05/01/19  5:14 PM  Result Value Ref Range   Troponin I 0.03 (HH) <0.03 ng/mL    Comment: CRITICAL RESULT CALLED TO, READ BACK BY AND VERIFIED WITH: P.POLLEUM RN 1827 05/01/2019 MCCORMICK K Performed at [facility] [address], [place], [place] [address]   Protime-INR     Status: None   Collection Time: 05/01/19  5:14 PM  Result Value Ref Range   Prothrombin Time 14.5 11.4 - 15.2 seconds   INR 1.1 0.8 - 1.2    Comment: (NOTE) INR goal varies based on device and disease states. Performed at [facility], [place] [address] [place], [place] [address]   Brain natriuretic peptide     Status: None   Collection Time: 05/01/19  5:14 PM  Result Value Ref Range   B Natriuretic Peptide 47.2 0.0 - 100.0 pg/mL    Comment: Performed at [facility] [address], [place], [place] [address]  Lactic acid, plasma     Status: None   Collection Time: 05/01/19  5:14 PM  Result Value Ref Range   Lactic Acid, Venous 1.1 0.5 - 1.9 mmol/L    Comment: Performed at [facility] [address] [place], [place] [address]  SARS Coronavirus 2 (CEPHEID - Performed in [facility] hospital lab), Hosp Order     Status: None   Collection Time: 05/01/19  7:21 PM  Result Value Ref Range   SARS Coronavirus 2 NEGATIVE NEGATIVE    Comment: (NOTE) If result is NEGATIVE SARS-CoV-2 target nucleic acids are NOT DETECTED. The SARS-CoV-2 RNA is generally detectable in upper and lower  respiratory specimens during the acute phase of infection. The lowest  concentration of SARS-CoV-2 viral copies this assay can detect is 250  copies / mL. A negative result does not preclude SARS-CoV-2 infection  and should not be used as the sole basis for treatment or other  patient  management decisions.  A negative result may occur with  improper specimen collection / handling, submission of specimen other  than nasopharyngeal swab, presence of viral mutation(s) within the  areas targeted by this assay, and inadequate number of viral copies  (<250 copies / mL). A negative result must be combined with clinical  observations, patient history, and epidemiological information. If result is POSITIVE SARS-CoV-2 target nucleic acids are DETECTED. The SARS-CoV-2 RNA is generally detectable in upper and lower  respiratory specimens dur ing the acute phase of infection.  Positive  results are indicative of active infection with SARS-CoV-2.  Clinical  correlation with patient history and other diagnostic information is  necessary to determine patient infection status.  Positive results do  not rule out bacterial infection or co-infection with other viruses. If result is PRESUMPTIVE POSTIVE SARS-CoV-2 nucleic acids MAY BE PRESENT.   A presumptive positive result was obtained on the submitted specimen  and confirmed on repeat testing.  While 2019 novel coronavirus  (SARS-CoV-2) nucleic acids may be present in the submitted sample  additional confirmatory testing may be necessary for epidemiological  and / or clinical management purposes  to differentiate between  SARS-CoV-2 and other Sarbecovirus currently known to infect humans.  If clinically indicated additional testing with an alternate test  methodology [id number]) is advised. The SARS-CoV-2 RNA is generally  detectable in upper and lower respiratory sp ecimens during the acute  phase of infection.  The expected result is Negative. Fact Sheet for Patients:  StrictlyIdeas.no Fact Sheet for Healthcare Providers: BankingDealers.co.za This test is not yet approved or cleared by the [place] FDA and has been authorized for detection and/or diagnosis of SARS-CoV-2 by FDA under  an Emergency Use Authorization (EUA).  This EUA will remain in effect (meaning this test can be used) for the duration of the COVID-19 declaration under Section 564(b)(1) of the Act, 21 U.S.C. section 360bbb-3(b)(1), unless the authorization is terminated or revoked sooner. Performed at [facility], [place] [address] [place], [place] [address]   CBG monitoring, ED     Status: Abnormal   Collection Time: 05/01/19  7:54 PM  Result Value Ref Range   Glucose-Capillary 102 (H) 70 - 99 mg/dL  Urinalysis, Routine w reflex microscopic     Status: Abnormal   Collection Time: 05/01/19  8:05 PM  Result Value Ref Range   Color, Urine YELLOW YELLOW   APPearance CLEAR CLEAR   Specific Gravity, Urine 1.009 1.005 - 1.030   pH 6.0 5.0 - 8.0   Glucose, UA NEGATIVE NEGATIVE mg/dL   Hgb urine dipstick SMALL (A) NEGATIVE   Bilirubin Urine NEGATIVE NEGATIVE   Ketones, ur NEGATIVE NEGATIVE mg/dL   Protein, ur 30 (A) NEGATIVE mg/dL   Nitrite NEGATIVE NEGATIVE   Leukocytes,Ua TRACE (A) NEGATIVE   RBC / HPF 0-5 0 - 5 RBC/hpf   WBC, UA 6-10 0 - 5 WBC/hpf   Bacteria, UA RARE (A) NONE SEEN   Squamous Epithelial / LPF 0-5 0 - 5    Comment: Performed at [facility], [place] [address] [place], [place] [address]   Ct Abdomen Pelvis Wo Contrast  Result Date: 05/01/2019 CLINICAL DATA:  Abdominal pain. Constipation x5 days with nausea and vomiting. EXAM: CT ABDOMEN AND PELVIS WITHOUT CONTRAST TECHNIQUE: Multidetector CT imaging of the abdomen and pelvis was performed following the standard protocol without IV contrast. COMPARISON:  None. FINDINGS: Lower chest: There are moderate to large bilateral pleural effusions. Bibasilar atelectasis is noted. Calcifications are noted of the mitral valve and coronary arteries. Atherosclerotic changes are noted of the thoracic aorta. Hepatobiliary: The liver contour appears somewhat nodular. There is no discrete hepatic mass, however evaluation is significantly limited in  the absence of IV contrast. There is cholelithiasis without definite CT evidence of acute cholecystitis. The common bile duct is not well appreciated but there does not appear to be overt biliary ductal dilatation. Pancreas: Unremarkable. No pancreatic ductal dilatation or surrounding inflammatory changes. Spleen: Normal in size without focal abnormality. Adrenals/Urinary Tract: No hydronephrosis. No radiopaque kidney stones. The bladder is decompressed which limits evaluations. The adrenal glands are unremarkable. Stomach/Bowel: There is scattered colonic diverticula without CT evidence of diverticulitis. There is a moderate amount of stool in the colon. The appendix is located in the right lower quadrant and is unremarkable. There is no bowel obstruction. There appears to be some tethering of the small bowel loops, which are mostly centrally located. There appears to be diffuse wall thickening of the gastric body and fundus. The esophagus is dilated and fluid-filled. There are few low-attenuation cystic structures in the upper abdomen (axial series 2, image 39) which are not well evaluated. These could represent mildly dilated loops of small bowel. Vascular/Lymphatic: Aortic atherosclerosis. No enlarged abdominal or pelvic lymph nodes. Reproductive: The left ovary appears enlarged for the patient's age measuring approximately 4.1 by 3 cm. The right ovary is unremarkable. There appears to be some diffuse  thickening of the endometrial canal, however this is suboptimally evaluated secondary to lack of IV contrast. Other: There is a large volume of abdominal ascites. There is some haziness of the mesentery. There are no definite peritoneal implants, however evaluation is significantly limited by lack of IV contrast. Musculoskeletal: No acute or significant osseous findings. IMPRESSION: 1. Moderate to large bilateral pleural effusions. 2. Moderate to large volume of abdominal ascites. There is some haziness of the  omentum and mesentery without definite peritoneal implants. However, evaluation is limited by lack of IV contrast. Fluid evaluation of the ascites is recommended to help exclude a malignant cause. 3. Mildly nodular appearance to the liver which can be seen in patients with cirrhosis. 4. Cholelithiasis without CT evidence of acute cholecystitis. 5. Wall thickening of the gastric body and fundus of unknown clinical significance. In addition, there is fluid dilation of the distal esophagus. Further evaluation with EGD should be considered. 6. Enlarged left ovary measuring 3 x 4.1 cm. Further evaluation with ultrasound is recommended. In addition, there appears to be diffuse thickening of the endometrial stripe. This should be further evaluated with ultrasound. 7. Moderate amount of stool throughout the colon. No evidence of a small-bowel obstruction. No CT evidence of diverticulitis. Aortic Atherosclerosis (ICD10-I70.0). Electronically Signed   By: Constance Holster M.D.   On: 05/01/2019 13:42   Dg Chest Portable 1 View  Result Date: 05/01/2019 CLINICAL DATA:  Dyspnea EXAM: PORTABLE CHEST 1 VIEW COMPARISON:  04/08/2014 chest radiograph. FINDINGS: Stable cardiomediastinal silhouette with normal heart size. No pneumothorax. Small bilateral pleural effusions. No pulmonary edema. Hazy right parahilar and bibasilar lung opacities. IMPRESSION: 1. Small bilateral pleural effusions. 2. Hazy right parahilar and bibasilar lung opacities, which could represent atelectasis and/or pneumonia. Electronically Signed   By: Ilona Sorrel M.D.   On: 05/01/2019 17:28    Pending Labs Unresulted Labs (From admission, onward)    Start     Ordered   05/01/19 2055  Occult blood card to lab, stool  Once,   R     05/01/19 2054   05/01/19 1846  Body fluid cell count with differential  Once,   R    Question:  Are there also cytology or pathology orders on this specimen?  Answer:  Yes   05/01/19 1845   05/01/19 1845  Body fluid  culture  [facility] ED BODY FLUID PANEL)  ONCE - STAT,   STAT    Question Answer Comment  Are there also cytology or pathology orders on this specimen? Yes   Patient immune status Normal      05/01/19 1845   05/01/19 1845  Gram stain  [facility] ED BODY FLUID PANEL)  ONCE - STAT,   STAT    Comments:  Stat order placed in ED   Question:  Patient immune status  Answer:  Normal   05/01/19 1845   05/01/19 1845  Glucose, Body Fluid Other  ([facility] ED BODY FLUID PANEL)  ONCE - STAT,   STAT     05/01/19 1845   05/01/19 1641  CA 125  Once,   R     05/01/19 1640          Vitals/Pain Today's Vitals   05/01/19 1934 05/01/19 1945 05/01/19 1946 05/01/19 2115  BP:  (!) 154/77  (!) 168/82  Pulse:  93    Resp:  20    Temp:      TempSrc:      SpO2:  99%    Height:  PainSc: 2   2      Isolation Precautions No active isolations  Medications Medications  ondansetron (ZOFRAN) injection 4 mg (4 mg Intravenous Given 05/01/19 1735)  lactated ringers bolus 1,000 mL (0 mLs Intravenous Hold 05/01/19 1736)  lidocaine (XYLOCAINE) 2 % (with pres) injection 200 mg (200 mg Intradermal Given 05/01/19 1917)  morphine 4 MG/ML injection 4 mg (4 mg Intravenous Given 05/01/19 1916)    Mobility walks with device Low fall risk   Focused Assessments GI   R Recommendations: See Admitting Provider Note  Report given to:   Additional Notes: Paracentesis performed at ED bedside. Occult stool positive.

## 2019-05-01 NOTE — ED Provider Notes (Signed)
[facility] EMERGENCY DEPARTMENT Provider Note   CSN: [id number] Arrival date & time: 05/01/19  1628   History   Chief Complaint Chief Complaint  Patient presents with  . Constipation    HPI Mary Harding is a 85 y.o. female.  HPI 85 year old female with a history of CAD, HTN, DM, CKD stage III, HLD presents with complaint of constipation.  Patient states that she has had 2 weeks of worsening nausea and vomiting associated abdominal distention and tightness.  She states that she is only had 3 small bowel movements for the past week.  She is still passing flatus.  Denies fever, cough.  She has had some shortness of breath but denies chest pain.  He does states that she has had difficulty with swallowing solid foods and feels like it does not fully pass.  She has had 2 cause herself to vomit and attempt to regurgitate the food bolus.  She reports that she is only been eating cereal and small meals.  Past Medical History:  Diagnosis Date  . Anxiety   . Arthritis    mild  . Blood transfusion 1960s   "related to having babies" (10/01/2018)  . Chronic lower back pain    hx ruptured disc;cyst on nerve ending  . CKD (chronic kidney disease), stage IV ([place])   . Coronary artery disease   . Depression   . Edema extremities    pt takes Torsemide daily  . Family history of adverse reaction to anesthesia    "I've had several people die from anesthesia in my family; malignant hyperthermia" (10/01/2018)  . GERD (gastroesophageal reflux disease)    takes Protonix daily  . Heart murmur   . History of shingles    74yrs ago  . Hyperlipidemia    takes Crestor daily  . Hypertension    takes Amlodipine daily  . Nocturia   . Peripheral neuropathy    left  . Sciatica    left side  . Shortness of breath    with exertion  . Sleep apnea    sleep study done in 2012;used CPAP "in the past; not anymore" (10/01/2018)  . Type II diabetes mellitus ([place])    takes  Glimepiride,Metformin,and Victoza;Lantus bid  . Urinary frequency    takes Detrol daily    Patient Active Problem List   Diagnosis Date Noted  . Pseudoaneurysm following procedure ([place]) 10/25/2018  . PAD (peripheral artery disease) ([place]) 10/01/2018  . Claudication in peripheral vascular disease ([place]) 09/30/2018  . Uncontrolled type 2 diabetes mellitus with hyperglycemia ([place]) 04/08/2018  . Hyperlipidemia 01/18/2017  . Urinary tract infection 01/18/2017  . Type II or unspecified type diabetes mellitus with renal manifestations, not stated as uncontrolled(250.40) 04/10/2014  . Type II or unspecified type diabetes mellitus without mention of complication, uncontrolled 04/10/2014  . Coronary atherosclerosis of native coronary artery 04/10/2014  . Unstable angina pectoris ([place]) 04/08/2014    Past Surgical History:  Procedure Laterality Date  . ABDOMINAL AORTOGRAM N/A 10/01/2018   Procedure: ABDOMINAL AORTOGRAM;  Surgeon: Adrian Prows, MD;  Location: [facility];  Service: Cardiovascular;  Laterality: N/A;  . CATARACT EXTRACTION W/PHACO  02/14/2012   Procedure: CATARACT EXTRACTION PHACO AND INTRAOCULAR LENS PLACEMENT (IOC);  Surgeon: Adonis Brook, MD;  Location: [facility];  Service: Ophthalmology;  Laterality: Left;  . COLONOSCOPY    . DILATION AND CURETTAGE OF UTERUS    . ESOPHAGOGASTRODUODENOSCOPY    . EYE SURGERY    . LEFT HEART CATHETERIZATION  WITH CORONARY ANGIOGRAM N/A 04/09/2014   Procedure: LEFT HEART CATHETERIZATION WITH CORONARY ANGIOGRAM;  Surgeon: [name], MD;  Location: [facility] CATH LAB;  Service: Cardiovascular;  Laterality: N/A;  . LOWER EXTREMITY ANGIOGRAPHY Bilateral 10/01/2018   Procedure: LOWER EXTREMITY ANGIOGRAPHY;  Surgeon: Adrian Prows, MD;  Location: [facility];  Service: Cardiovascular;  Laterality: Bilateral;  . LYMPH NODE DISSECTION Left 1970s   "neck"  . PARS PLANA VITRECTOMY  02/14/2012   Procedure: PARS PLANA VITRECTOMY WITH 23 GAUGE;  Surgeon:  Adonis Brook, MD;  Location: [facility];  Service: Ophthalmology;  Laterality: Left;  . PERIPHERAL VASCULAR ATHERECTOMY Right 10/01/2018   SFA WITH DRUG COATED BALLOON  . PERIPHERAL VASCULAR ATHERECTOMY Right 10/01/2018   Procedure: PERIPHERAL VASCULAR ATHERECTOMY;  Surgeon: Adrian Prows, MD;  Location: [facility];  Service: Cardiovascular;  Laterality: Right;  SFA WITH DRUG COATED BALLOON  . REFRACTIVE SURGERY    . TUBAL LIGATION       OB History   No obstetric history on file.      Home Medications    Prior to Admission medications   Medication Sig Start Date End Date Taking? Authorizing Provider  aspirin 81 MG tablet Take 1 tablet (81 mg total) by mouth daily. 10/02/18   Adrian Prows, MD  buPROPion (WELLBUTRIN XL) 150 MG 24 hr tablet Take 150 mg by mouth daily. 08/05/18   [provider]  carvedilol (COREG) 12.5 MG tablet Take 12.5 mg by mouth 2 (two) times daily with a meal.    [provider]  clopidogrel (PLAVIX) 75 MG tablet Take 1 tablet (75 mg total) by mouth daily with breakfast. 04/10/14   Adrian Prows, MD  diclofenac sodium (VOLTAREN) 1 % GEL Apply 1 application topically 4 (four) times daily as needed (pain).    [provider]  EPINEPHrine (EPIPEN) 0.3 mg/0.3 mL SOAJ injection Inject 0.3 mg into the muscle once as needed (for bee stings).    [provider]  ferrous sulfate 325 (65 FE) MG tablet Take 325 mg by mouth 2 (two) times daily.    [provider]  furosemide (LASIX) 20 MG tablet Take 20 mg by mouth daily.    [provider]  insulin aspart (NOVOLOG) 100 UNIT/ML injection Inject 3-10 Units into the skin 3 (three) times daily before meals.     [provider]  insulin glargine (LANTUS) 100 UNIT/ML injection Inject 27 Units into the skin daily.     [provider]  Liraglutide (VICTOZA) 18 MG/3ML SOLN Inject 1.8 mg into the skin daily.     [provider]  Multiple Vitamins-Minerals  (MULTIVITAMIN WITH MINERALS) tablet Take 1 tablet by mouth daily.    [provider]  Naphazoline HCl (CLEAR EYES OP) Place 1 drop into both eyes daily as needed (dry eyes).    [provider]  nitroGLYCERIN (NITROSTAT) 0.4 MG SL tablet Place 0.4 mg under the tongue every 5 (five) minutes as needed for chest pain.    [provider]  pantoprazole (PROTONIX) 40 MG tablet Take 40 mg by mouth daily.    [provider]  potassium chloride (K-DUR,KLOR-CON) 10 MEQ tablet Take 10 mEq by mouth daily.    [provider]  rosuvastatin (CRESTOR) 10 MG tablet Take 10 mg by mouth daily.    [provider]  traMADol (ULTRAM) 50 MG tablet Take 50 mg by mouth every 6 (six) hours as needed for moderate pain.    [provider]    Family History Family History  Problem Relation Age of Onset  . Diabetes Mother   . Dementia Mother   . Early death Father   . Diabetes Sister   . Heart disease Sister   . Heart disease Maternal Grandfather   . Anesthesia problems Neg Hx   . Hypotension Neg Hx   . Malignant hyperthermia Neg Hx   . Pseudochol deficiency Neg Hx     Social History Social History   Tobacco Use  . Smoking status: Former Smoker    Packs/day: 1.00    Years: 15.00    Pack years: 15.00    Types: Cigarettes    Last attempt to quit: 01/18/1997    Years since quitting: 22.2  . Smokeless tobacco: Never Used  Substance Use Topics  . Alcohol use: Not Currently  . Drug use: Never     Allergies   Bee venom and Other   Review of Systems Review of Systems  Constitutional: Negative for chills and fever.  HENT: Negative for ear pain and sore throat.   Eyes: Negative for pain and visual disturbance.  Respiratory: Positive for shortness of breath. Negative for cough.   Cardiovascular: Negative for chest pain and palpitations.  Gastrointestinal: Positive for abdominal pain, nausea and vomiting. Negative for blood in stool.   Genitourinary: Negative for dysuria and hematuria.  Musculoskeletal: Negative for arthralgias and back pain.  Skin: Negative for color change and rash.  Neurological: Negative for seizures and syncope.  All other systems reviewed and are negative.    Physical Exam Updated Vital Signs BP (!) 146/70   Pulse 93   Temp 98.4 F (36.9 C) (Oral)   Resp 20   Ht 5\' 6"  (1.676 m)   SpO2 99%   BMI 27.44 kg/m   Physical Exam Vitals signs and nursing note reviewed.  Constitutional:      General: She is not in acute distress.    Appearance: She is well-developed.  HENT:     Head: Normocephalic and atraumatic.     Mouth/Throat:     Mouth: Mucous membranes are dry.  Eyes:     Extraocular Movements: Extraocular movements intact.     Conjunctiva/sclera: Conjunctivae normal.  Neck:     Musculoskeletal: Normal range of motion and neck supple.  Cardiovascular:     Rate and Rhythm: Normal rate and regular rhythm.     Heart sounds: No murmur.  Pulmonary:     Effort: Pulmonary effort is normal. No respiratory distress.     Breath sounds: Normal breath sounds.  Abdominal:     General: There is distension.     Palpations: Abdomen is soft. There is fluid wave.     Tenderness: There is generalized abdominal tenderness.  Skin:    General: Skin is warm and dry.  Neurological:     General: No focal deficit present.     Mental Status: She is alert and oriented to person, place, and time.  Psychiatric:        Mood and Affect: Mood normal.      ED Treatments / Results  Labs (all labs ordered are listed, but only abnormal results are displayed) Labs Reviewed  CBC WITH DIFFERENTIAL/PLATELET - Abnormal; Notable for the following components:      Result Value   WBC 11.0 (*)    RBC 3.50 (*)    Hemoglobin 10.9 (*)    HCT 34.1 (*)    Abs Immature Granulocytes 0.10 (*)  All other components within normal limits  COMPREHENSIVE METABOLIC PANEL - Abnormal; Notable for the following  components:   Glucose, Bld 145 (*)    Creatinine, Ser 2.10 (*)    Total Protein 6.1 (*)    Albumin 2.8 (*)    GFR calc non Af Amer 21 (*)    GFR calc Af Amer 25 (*)    All other components within normal limits  TROPONIN I - Abnormal; Notable for the following components:   Troponin I 0.03 (*)    All other components within normal limits  URINALYSIS, ROUTINE W REFLEX MICROSCOPIC - Abnormal; Notable for the following components:   Hgb urine dipstick SMALL (*)    Protein, ur 30 (*)    Leukocytes,Ua TRACE (*)    Bacteria, UA RARE (*)    All other components within normal limits  CBG MONITORING, ED - Abnormal; Notable for the following components:   Glucose-Capillary 102 (*)    All other components within normal limits  SARS CORONAVIRUS 2 (HOSPITAL ORDER, [facility])  BODY FLUID CULTURE  GRAM STAIN  PROTIME-INR  BRAIN NATRIURETIC PEPTIDE  LACTIC ACID, PLASMA  CA 125  GLUCOSE, BODY FLUID OTHER  BODY FLUID CELL COUNT WITH DIFFERENTIAL  OCCULT BLOOD X 1 CARD TO LAB, STOOL  CYTOLOGY - NON PAP    EKG None  Radiology  Procedures .Paracentesis Date/Time: 05/01/2019 10:09 PM Performed by: Trinidad Curet, MD Authorized by: Trinidad Curet, MD   Consent:    Consent obtained:  Written   Consent given by:  Patient   Risks discussed:  Bleeding, bowel perforation, infection and pain   Alternatives discussed:  No treatment and delayed treatment Pre-procedure details:    Procedure purpose:  Therapeutic   Preparation: Patient was prepped and draped in usual sterile fashion   Anesthesia (see MAR for exact dosages):    Anesthesia method:  Local infiltration   Local anesthetic:  Lidocaine 2% WITH epi Procedure details:    Needle gauge:  18   Ultrasound guidance: yes     Puncture site:  L lower quadrant   Fluid removed amount:  3 liters   Fluid appearance:  Yellow   Dressing:  Adhesive bandage Post-procedure details:    Patient tolerance of  procedure:  Tolerated well, no immediate complications   (including critical care time)  Medications Ordered in ED Medications  ondansetron (ZOFRAN) injection 4 mg (4 mg Intravenous Given 05/01/19 1735)  lactated ringers bolus 1,000 mL (0 mLs Intravenous Stopped 05/01/19 2202)  lidocaine (XYLOCAINE) 2 % (with pres) injection 200 mg (200 mg Intradermal Given 05/01/19 1917)  morphine 4 MG/ML injection 4 mg (4 mg Intravenous Given 05/01/19 1916)     Initial Impression / Assessment and Plan / ED Course  I have reviewed the triage vital signs and the nursing notes.  Pertinent labs & imaging results that were available during my care of the patient were reviewed by me and considered in my medical decision making (see chart for details).  85 year old female with a history of CAD, HTN, DM, CKD stage III, HLD presents with complaint of constipation. Patient states that she has had 2 weeks of worsening nausea and vomiting associated abdominal distention and tightness.  Patient is hemodynamically stable.  Afebrile.  Patient had a CT scan performed today that showed moderate to large bilateral pleural effusions, moderate to large volume of abdominal ascites, mildly nodular appearance of the liver, cholelithiasis without CT evidence of acute cholecystitis, wall thickening of  the gastric body and fundus of unknown clinical significance.  Fluid dilation of the distal esophagus.  Enlarged left ovary measuring 3 x 4.1 cm.  Moderate amount of stool throughout the colon with no evidence of SBO.  No CT evidence of diverticulitis.    WBC 11, hemoglobin 10.9, platelets 297.  Creatinine 2.1.  LFTs are within normal limits.  NR 1.1.  Troponin 0.03.   Chest x-ray shows small bilateral pleural effusions.  Hazy right perihilar and bibasilar lung opacities, which could represent atelectasis and/or pneumonia.  Paracentesis performed.  Patient admitted for further management.    Final Clinical Impressions(s) / ED  Diagnoses   Final diagnoses:  Other ascites  Acute renal failure superimposed on chronic kidney disease, unspecified CKD stage, unspecified acute renal failure type [facility])    ED Discharge Orders    None       Trinidad Curet, MD 05/01/19 2210    Drenda Freeze, MD 05/01/19 2252

## 2019-05-02 ENCOUNTER — Inpatient Hospital Stay (HOSPITAL_COMMUNITY): Payer: Medicare Other

## 2019-05-02 DIAGNOSIS — E785 Hyperlipidemia, unspecified: Secondary | ICD-10-CM

## 2019-05-02 DIAGNOSIS — Z79899 Other long term (current) drug therapy: Secondary | ICD-10-CM

## 2019-05-02 DIAGNOSIS — E43 Unspecified severe protein-calorie malnutrition: Secondary | ICD-10-CM | POA: Diagnosis present

## 2019-05-02 DIAGNOSIS — E1151 Type 2 diabetes mellitus with diabetic peripheral angiopathy without gangrene: Secondary | ICD-10-CM

## 2019-05-02 DIAGNOSIS — K3189 Other diseases of stomach and duodenum: Secondary | ICD-10-CM

## 2019-05-02 DIAGNOSIS — Z884 Allergy status to anesthetic agent status: Secondary | ICD-10-CM

## 2019-05-02 DIAGNOSIS — R188 Other ascites: Secondary | ICD-10-CM | POA: Diagnosis present

## 2019-05-02 DIAGNOSIS — N179 Acute kidney failure, unspecified: Secondary | ICD-10-CM

## 2019-05-02 DIAGNOSIS — Z794 Long term (current) use of insulin: Secondary | ICD-10-CM

## 2019-05-02 DIAGNOSIS — J90 Pleural effusion, not elsewhere classified: Secondary | ICD-10-CM

## 2019-05-02 DIAGNOSIS — N838 Other noninflammatory disorders of ovary, fallopian tube and broad ligament: Secondary | ICD-10-CM

## 2019-05-02 DIAGNOSIS — Z87891 Personal history of nicotine dependence: Secondary | ICD-10-CM

## 2019-05-02 DIAGNOSIS — R350 Frequency of micturition: Secondary | ICD-10-CM

## 2019-05-02 DIAGNOSIS — R194 Change in bowel habit: Secondary | ICD-10-CM

## 2019-05-02 DIAGNOSIS — K219 Gastro-esophageal reflux disease without esophagitis: Secondary | ICD-10-CM

## 2019-05-02 DIAGNOSIS — R935 Abnormal findings on diagnostic imaging of other abdominal regions, including retroperitoneum: Secondary | ICD-10-CM

## 2019-05-02 DIAGNOSIS — N189 Chronic kidney disease, unspecified: Secondary | ICD-10-CM

## 2019-05-02 DIAGNOSIS — K769 Liver disease, unspecified: Secondary | ICD-10-CM

## 2019-05-02 DIAGNOSIS — I129 Hypertensive chronic kidney disease with stage 1 through stage 4 chronic kidney disease, or unspecified chronic kidney disease: Secondary | ICD-10-CM

## 2019-05-02 DIAGNOSIS — I252 Old myocardial infarction: Secondary | ICD-10-CM

## 2019-05-02 DIAGNOSIS — R900 Intracranial space-occupying lesion found on diagnostic imaging of central nervous system: Secondary | ICD-10-CM

## 2019-05-02 DIAGNOSIS — R1900 Intra-abdominal and pelvic swelling, mass and lump, unspecified site: Secondary | ICD-10-CM | POA: Diagnosis present

## 2019-05-02 DIAGNOSIS — Z9851 Tubal ligation status: Secondary | ICD-10-CM

## 2019-05-02 DIAGNOSIS — R971 Elevated cancer antigen 125 [CA 125]: Secondary | ICD-10-CM

## 2019-05-02 DIAGNOSIS — E1142 Type 2 diabetes mellitus with diabetic polyneuropathy: Secondary | ICD-10-CM

## 2019-05-02 DIAGNOSIS — E1122 Type 2 diabetes mellitus with diabetic chronic kidney disease: Secondary | ICD-10-CM

## 2019-05-02 DIAGNOSIS — R1310 Dysphagia, unspecified: Secondary | ICD-10-CM

## 2019-05-02 DIAGNOSIS — I2510 Atherosclerotic heart disease of native coronary artery without angina pectoris: Secondary | ICD-10-CM

## 2019-05-02 LAB — HEMOGLOBIN A1C
Hgb A1c MFr Bld: 9.5 % — ABNORMAL HIGH (ref 4.8–5.6)
Mean Plasma Glucose: 225.95 mg/dL

## 2019-05-02 LAB — LIPID PANEL
Cholesterol: 94 mg/dL (ref 0–200)
HDL: 32 mg/dL — ABNORMAL LOW (ref >40)
LDL Cholesterol: 38 mg/dL (ref 0–99)
Total CHOL/HDL Ratio: 2.9 RATIO
Triglycerides: 121 mg/dL (ref <150)
VLDL: 24 mg/dL (ref 0–40)

## 2019-05-02 LAB — GLUCOSE, CAPILLARY
Glucose-Capillary: 133 mg/dL — ABNORMAL HIGH (ref 70–99)
Glucose-Capillary: 58 mg/dL — ABNORMAL LOW (ref 70–99)
Glucose-Capillary: 155 mg/dL — ABNORMAL HIGH (ref 70–99)
Glucose-Capillary: 105 mg/dL — ABNORMAL HIGH (ref 70–99)
Glucose-Capillary: 102 mg/dL — ABNORMAL HIGH (ref 70–99)
Glucose-Capillary: 101 mg/dL — ABNORMAL HIGH (ref 70–99)
Glucose-Capillary: 97 mg/dL (ref 70–99)

## 2019-05-02 LAB — CBC
HCT: 33.1 % — ABNORMAL LOW (ref 36.0–46.0)
Hemoglobin: 10.7 g/dL — ABNORMAL LOW (ref 12.0–15.0)
MCH: 30.7 pg (ref 26.0–34.0)
MCHC: 32.3 g/dL (ref 30.0–36.0)
MCV: 95.1 fL (ref 80.0–100.0)
Platelets: 320 10*3/uL (ref 150–400)
RBC: 3.48 MIL/uL — ABNORMAL LOW (ref 3.87–5.11)
RDW: 14 % (ref 11.5–15.5)
WBC: 11.5 10*3/uL — ABNORMAL HIGH (ref 4.0–10.5)
nRBC: 0 % (ref 0.0–0.2)

## 2019-05-02 LAB — OCCULT BLOOD, POC DEVICE: Fecal Occult Bld: POSITIVE — AB

## 2019-05-02 LAB — TROPONIN I
Troponin I: 0.04 ng/mL (ref <0.03)
Troponin I: 0.03 ng/mL (ref <0.03)
Troponin I: <0.03 ng/mL (ref <0.03)

## 2019-05-02 LAB — BASIC METABOLIC PANEL
Anion gap: 7 (ref 5–15)
BUN: 16 mg/dL (ref 8–23)
CO2: 24 mmol/L (ref 22–32)
Calcium: 8.6 mg/dL — ABNORMAL LOW (ref 8.9–10.3)
Chloride: 106 mmol/L (ref 98–111)
Creatinine, Ser: 1.78 mg/dL — ABNORMAL HIGH (ref 0.44–1.00)
GFR calc Af Amer: 30 mL/min — ABNORMAL LOW (ref >60)
GFR calc non Af Amer: 26 mL/min — ABNORMAL LOW (ref >60)
Glucose, Bld: 78 mg/dL (ref 70–99)
Potassium: 4 mmol/L (ref 3.5–5.1)
Sodium: 137 mmol/L (ref 135–145)

## 2019-05-02 LAB — IRON AND TIBC
Iron: 34 ug/dL (ref 28–170)
Saturation Ratios: 20 % (ref 10.4–31.8)
TIBC: 172 ug/dL — ABNORMAL LOW (ref 250–450)
UIBC: 138 ug/dL

## 2019-05-02 LAB — FERRITIN: Ferritin: 185 ng/mL (ref 11–307)

## 2019-05-02 LAB — CA 125: Cancer Antigen (CA) 125: 594 U/mL — ABNORMAL HIGH (ref 0.0–38.1)

## 2019-05-02 MED ORDER — DEXTROSE 50 % IV SOLN
INTRAVENOUS | Status: AC
  Administered 2019-05-02: 04:00:00 25 mL
  Filled 2019-05-02: qty 50

## 2019-05-02 MED ORDER — BISACODYL 10 MG RE SUPP
10.0000 mg | Freq: Once | RECTAL | Status: AC
  Administered 2019-05-02: 15:00:00 10 mg via RECTAL
  Filled 2019-05-02: qty 1

## 2019-05-02 MED ORDER — ONDANSETRON HCL 4 MG/2ML IJ SOLN
4.0000 mg | Freq: Four times a day (QID) | INTRAMUSCULAR | Status: DC | PRN
  Administered 2019-05-03 – 2019-05-06 (×4): 4 mg via INTRAVENOUS
  Filled 2019-05-02 (×5): qty 2

## 2019-05-02 MED ORDER — SENNOSIDES-DOCUSATE SODIUM 8.6-50 MG PO TABS
1.0000 | ORAL_TABLET | Freq: Two times a day (BID) | ORAL | Status: DC
  Administered 2019-05-02 – 2019-05-08 (×9): 1 via ORAL
  Filled 2019-05-02 (×13): qty 1

## 2019-05-02 MED ORDER — BISACODYL 10 MG RE SUPP
10.0000 mg | Freq: Once | RECTAL | Status: AC
  Administered 2019-05-03: 10:00:00 10 mg via RECTAL
  Filled 2019-05-02: qty 1

## 2019-05-02 MED ORDER — POLYETHYLENE GLYCOL 3350 17 G PO PACK
17.0000 g | PACK | Freq: Every day | ORAL | Status: DC
  Administered 2019-05-02 – 2019-05-04 (×3): 17 g via ORAL
  Filled 2019-05-02 (×3): qty 1

## 2019-05-02 MED ORDER — HYDROCODONE-ACETAMINOPHEN 5-325 MG PO TABS
1.0000 | ORAL_TABLET | ORAL | Status: DC | PRN
  Administered 2019-05-02 – 2019-05-03 (×3): 1 via ORAL
  Filled 2019-05-02 (×3): qty 1

## 2019-05-02 NOTE — Evaluation (Signed)
Clinical/Bedside Swallow Evaluation Patient Details  Name: Mary Harding MRN: [id number] Date of Birth: 10-03-1934  Today's Date: 05/02/2019 Time: SLP Start Time (ACUTE ONLY): 0848 SLP Stop Time (ACUTE ONLY): 0903 SLP Time Calculation (min) (ACUTE ONLY): 15 min  Past Medical History:  Past Medical History:  Diagnosis Date  . Anxiety   . Arthritis    mild  . Blood transfusion 1960s   "related to having babies" (10/01/2018)  . Chronic lower back pain    hx ruptured disc;cyst on nerve ending  . CKD (chronic kidney disease), stage IV ([place])   . Coronary artery disease   . Depression   . Edema extremities    pt takes Torsemide daily  . Family history of adverse reaction to anesthesia    "I've had several people die from anesthesia in my family; malignant hyperthermia" (10/01/2018)  . GERD (gastroesophageal reflux disease)    takes Protonix daily  . Heart murmur   . History of shingles    28yrs ago  . Hyperlipidemia    takes Crestor daily  . Hypertension    takes Amlodipine daily  . Nocturia   . Peripheral neuropathy    left  . Sciatica    left side  . Shortness of breath    with exertion  . Sleep apnea    sleep study done in 2012;used CPAP "in the past; not anymore" (10/01/2018)  . Type II diabetes mellitus ([place])    takes Glimepiride,Metformin,and Victoza;Lantus bid  . Urinary frequency    takes Detrol daily   Past Surgical History:  Past Surgical History:  Procedure Laterality Date  . ABDOMINAL AORTOGRAM N/A 10/01/2018   Procedure: ABDOMINAL AORTOGRAM;  Surgeon: Adrian Prows, MD;  Location: [facility];  Service: Cardiovascular;  Laterality: N/A;  . CATARACT EXTRACTION W/PHACO  02/14/2012   Procedure: CATARACT EXTRACTION PHACO AND INTRAOCULAR LENS PLACEMENT (IOC);  Surgeon: Adonis Brook, MD;  Location: [facility];  Service: Ophthalmology;  Laterality: Left;  . COLONOSCOPY    . DILATION AND CURETTAGE OF UTERUS    . ESOPHAGOGASTRODUODENOSCOPY    . EYE SURGERY    .  LEFT HEART CATHETERIZATION WITH CORONARY ANGIOGRAM N/A 04/09/2014   Procedure: LEFT HEART CATHETERIZATION WITH CORONARY ANGIOGRAM;  Surgeon: [name], MD;  Location[facility] CATH LAB;  Service: Cardiovascular;  Laterality: N/A;  . LOWER EXTREMITY ANGIOGRAPHY Bilateral 10/01/2018   Procedure: LOWER EXTREMITY ANGIOGRAPHY;  Surgeon: Adrian Prows, MD;  Location: [facility];  Service: Cardiovascular;  Laterality: Bilateral;  . LYMPH NODE DISSECTION Left 1970s   "neck"  . PARS PLANA VITRECTOMY  02/14/2012   Procedure: PARS PLANA VITRECTOMY WITH 23 GAUGE;  Surgeon: Adonis Brook, MD;  Location: [facility];  Service: Ophthalmology;  Laterality: Left;  . PERIPHERAL VASCULAR ATHERECTOMY Right 10/01/2018   SFA WITH DRUG COATED BALLOON  . PERIPHERAL VASCULAR ATHERECTOMY Right 10/01/2018   Procedure: PERIPHERAL VASCULAR ATHERECTOMY;  Surgeon: Adrian Prows, MD;  Location: [facility] [facility];  Service: Cardiovascular;  Laterality: Right;  SFA WITH DRUG COATED BALLOON  . REFRACTIVE SURGERY    . TUBAL LIGATION     HPI:  Mary Harding is a 85 y.o. female presenting with consipation, nausea an dintermittint vomiting. PMH is significant for GERD, CAD, HTN, HLD, T2DM, CKD IV, anxiety/depression, arthritis, OSA, peripheral neuropathy, and PAD. Found to have liver Nodules/abdominal Ascites with differential including SBP vs Malignancy vs Cirrhosis vs CHF.  Per MD note malignancy unfortunately is the most likely cause of her new  onset dysphagia and ascities and would benefit from EDG due to CT Abd findings of dilated and fluid around lower esophagus and wall thickening of the gastric fundus and body. Dysphagia: acute, worsening and endorsed 1 month history of dysphagia to both solids and liquids with daily vomitus of food eaten. Notes food gets stuck mid-sternum. Some concern for esophageal stricture vs neoplasm. CXR bibasilar Lung Opacities: Concern for atelectasis vs PNA. MD stated unlikely PNA given clinical picture.    Assessment / Plan / Recommendation Clinical Impression  History and clinical observation points to an esophageal/GI source for dysphagia. Globus sensation with usual emesis 5 min after po consumption beginning 2 months ago. Per pt no significant difference in symptoms between liquids and solids- esophageal impairments sometimes relieved more from liquids. She demonstrated multiple swallows, eructation, hand to chest during water and applesauce trials. No s/s aspiration today and she denies sensation of coughing or "strangling". When she is cleared for po's from GI, recommend regular texture to allow pt more choices of foods she can tolerate (has specific items she eats at home). Educated re: basic esophageal precautions/strategies to mitigate symptoms. Do not feel she needs f/u from ST at this time. Please reconsult if further assist needed.   SLP Visit Diagnosis: Dysphagia, unspecified (R13.10)    Aspiration Risk  Mild aspiration risk    Diet Recommendation NPO;Other (Comment)(d/t pending GI consult)   Medication Administration: Other (Comment)(liquid or applesauce) Postural Changes: Remain upright for at least 30 minutes after po intake;Seated upright at 90 degrees    Other  Recommendations Oral Care Recommendations: Oral care BID   Follow up Recommendations None      Frequency and Duration            Prognosis        Swallow Study   General HPI: Mary Harding is a 85 y.o. female presenting with consipation, nausea an dintermittint vomiting. PMH is significant for GERD, CAD, HTN, HLD, T2DM, CKD IV, anxiety/depression, arthritis, OSA, peripheral neuropathy, and PAD. Found to have liver Nodules/abdominal Ascites with differential including SBP vs Malignancy vs Cirrhosis vs CHF.  Per MD note malignancy unfortunately is the most likely cause of her new onset dysphagia and ascities and would benefit from EDG due to CT Abd findings of dilated and fluid around lower esophagus and wall  thickening of the gastric fundus and body. Dysphagia: acute, worsening and endorsed 1 month history of dysphagia to both solids and liquids with daily vomitus of food eaten. Notes food gets stuck mid-sternum. Some concern for esophageal stricture vs neoplasm. CXR bibasilar Lung Opacities: Concern for atelectasis vs PNA. MD stated unlikely PNA given clinical picture. Type of Study: Bedside Swallow Evaluation Previous Swallow Assessment: (none) Diet Prior to this Study: NPO Temperature Spikes Noted: No Respiratory Status: Room air History of Recent Intubation: No Behavior/Cognition: Pleasant mood;Cooperative;Alert Oral Cavity Assessment: Within Functional Limits Oral Care Completed by SLP: No Oral Cavity - Dentition: Dentures, top;Dentures, bottom Vision: Functional for self-feeding Self-Feeding Abilities: Able to feed self Patient Positioning: Upright in bed Baseline Vocal Quality: Normal Volitional Cough: Strong Volitional Swallow: Able to elicit    Oral/Motor/Sensory Function Overall Oral Motor/Sensory Function: Within functional limits   Ice Chips Ice chips: Not tested   Thin Liquid Thin Liquid: Within functional limits    Nectar Thick Nectar Thick Liquid: Not tested   Honey Thick Honey Thick Liquid: Not tested   Puree Puree: Within functional limits   Solid     Solid: Not tested  Houston Siren 05/02/2019,9:19 AM  Orbie Pyo Colvin Caroli.Ed Risk analyst [phone] Office [phone]

## 2019-05-02 NOTE — Consult Note (Signed)
Consult Note: Gyn-Onc  Consult was requested by Dr. Madison Hickman for the evaluation of Mary Harding 85 y.o. female  CC:  Chief Complaint  Patient presents with  . Constipation    Assessment/Plan:  Mary Harding  is a 85 y.o.  year old with ascites, pelvic masses, and concern for possible metastatic carcinoma/peritoneal carcinomatosis and severe malnutrition.  I reviewed her CT scan and abdominal US. I reviewed her labs including tumor marker (CA 125).  I discussed with Dempsey that I have concern that she might have metastatic carcinoma. If this is a gynecologic primary, she would best be a candidate for neoadjuvant chemotherapy followed by consideration for interval debulking if she demonstrates a good clinical response.  Given the abnormal findings of the wall of the stomach, I encourage an evaluation by GI to ensure there is not an occult gastric process that is the cause or concurrent.  We will follow-up the results of her paracentesis and any endoscopy should this performed. Pending these results I will have her set up to meet with medical oncology if malignancy is identified.    HPI: Mary Harding is a 85 year old P6 who is seen in consultation at the request of Dr Andria Frames for ascites, pelvic masses and elevated CA 125.  Patient reports that approximately 6 weeks ago prior to presentation she developed upper gastrointestinal symptoms of nausea vomiting and ill inability to tolerate oral.  She then in the following her most recent 2-week.  Developed symptoms of abdominal bloating and distention.  She had not been able to eat adequately in the weeks leading up to her presentation to the emergency department.  On the evening of May 01, 2019 she presented to the [facility] emergency department with abdominal distention and intermittent emesis.  A CT scan of the abdomen and pelvis was performed which showed moderate to large bilateral pleural effusions, moderate to large volume of  abdominal ascites, there is some omental and mesenteric haziness without definitive peritoneal implants.  Evaluation was limited due to lack of IV contrast.  There is a mildly nodular appearance of the liver which could be consistent with either cirrhosis or peritoneal carcinomatosis.  There is wall thickening of the gastric body and fundus of unknown clinical significance and fluid dilation of the distal esophagus.  The left ovary appeared somewhat enlarged measuring 3 x 4.1 cm.  There was diffuse thickening of the endometrial stripe.  Transvaginal ultrasound was performed on May 02, 2019 and this demonstrated a normal postmenopausal uterus was not visualized.  In the location of where the uterus would be expected to be, was a structure measuring 3.4 x 2.4 x 3 cm.  It contained heterogeneous echogenicity with internal blood flow.  Assuming the structure was a uterus the endometrial thickness was approximately 16 mm.  The right ovary was not discretely visualized, and the left ovary measured 1.6 x 1.2 x 1.9 cm.  It was felt that the exam was technically limited.  Ca1 25 was elevated at 594.  With respect to other lab abnormalities she presented with acute renal insufficiency with a creatinine of 2.1 (baseline is 1.5).  She also manifested signs of severe protein wasting malnutrition with an albumin of less than 3.  Her liver function tests were grossly normal.  Her CBC showed mild anemia.  She has a gynecologic history significant for 6 vaginal deliveries and a tubal ligation.  She had no other abdominal surgeries following this.  She has a  medical history that is significant for hypertension, diabetes mellitus, vascular disease including peripheral vascular disease and coronary artery disease,, and she has a history of a myocardial infarction.  She has a history of tobacco abuse but does not currently smoke.  She has chronic kidney disease as mentioned above.  Current Meds:  Current Facility-Administered  Medications:  .  0.9 %  sodium chloride infusion, 250 mL, Intravenous, PRN, Mullis, Kiersten P, DO .  acetaminophen (TYLENOL) tablet 650 mg, 650 mg, Oral, Q6H PRN **OR** acetaminophen (TYLENOL) suppository 650 mg, 650 mg, Rectal, Q6H PRN, Mullis, Kiersten P, DO .  buPROPion (WELLBUTRIN XL) 24 hr tablet 150 mg, 150 mg, Oral, Daily, Mullis, Kiersten P, DO, 150 mg at 05/02/19 0900 .  carvedilol (COREG) tablet 12.5 mg, 12.5 mg, Oral, BID WC, Mullis, Kiersten P, DO, 12.5 mg at 05/02/19 0900 .  clopidogrel (PLAVIX) tablet 75 mg, 75 mg, Oral, Q breakfast, Mullis, Kiersten P, DO, 75 mg at 05/02/19 0858 .  enoxaparin (LOVENOX) injection 30 mg, 30 mg, Subcutaneous, Q24H, Mullis, Kiersten P, DO, 30 mg at 05/02/19 0901 .  insulin aspart (novoLOG) injection 0-9 Units, 0-9 Units, Subcutaneous, TID WC, Mullis, Kiersten P, DO .  lidocaine (LIDODERM) 5 % 1 patch, 1 patch, Transdermal, Q24H, Mullis, Kiersten P, DO, 1 patch at 05/02/19 0901 .  ondansetron (ZOFRAN) injection 4 mg, 4 mg, Intravenous, Q6H PRN, Winfrey, Amanda C, MD .  pantoprazole (PROTONIX) EC tablet 40 mg, 40 mg, Oral, Daily, Mullis, Kiersten P, DO, 40 mg at 05/02/19 0903 .  polyethylene glycol (MIRALAX / GLYCOLAX) packet 17 g, 17 g, Oral, Daily, Benay Pike, MD .  rosuvastatin (CRESTOR) tablet 10 mg, 10 mg, Oral, Daily, Mullis, Kiersten P, DO, 10 mg at 05/02/19 0903 .  senna-docusate (Senokot-S) tablet 1 tablet, 1 tablet, Oral, BID, Benay Pike, MD .  sodium chloride flush (NS) 0.9 % injection 3 mL, 3 mL, Intravenous, Q12H, Mullis, Kiersten P, DO, 3 mL at 05/02/19 0904 .  sodium chloride flush (NS) 0.9 % injection 3 mL, 3 mL, Intravenous, Q12H, Mullis, Kiersten P, DO, 3 mL at 05/02/19 0903 .  sodium chloride flush (NS) 0.9 % injection 3 mL, 3 mL, Intravenous, PRN, Mullis, Kiersten P, DO .  traMADol (ULTRAM) tablet 50 mg, 50 mg, Oral, Q6H PRN, Mullis, Kiersten P, DO, 50 mg at 05/02/19 1241   Allergy:  Allergies  Allergen Reactions  . Bee  Venom Anaphylaxis  . Other Other (See Comments)    general anesthesia- Malignant hypothermia     Social Hx:   Social History   Socioeconomic History  . Marital status: Widowed    Spouse name: Not on file  . Number of children: Not on file  . Years of education: Not on file  . Highest education level: Not on file  Occupational History  . Not on file  Social Needs  . Financial resource strain: Not on file  . Food insecurity:    Worry: Not on file    Inability: Not on file  . Transportation needs:    Medical: Not on file    Non-medical: Not on file  Tobacco Use  . Smoking status: Former Smoker    Packs/day: 1.00    Years: 15.00    Pack years: 15.00    Types: Cigarettes    Last attempt to quit: 01/18/1997    Years since quitting: 22.2  . Smokeless tobacco: Never Used  Substance and Sexual Activity  . Alcohol use: Not Currently  .  Drug use: Never  . Sexual activity: Not Currently  Lifestyle  . Physical activity:    Days per week: Not on file    Minutes per session: Not on file  . Stress: Not on file  Relationships  . Social connections:    Talks on phone: Not on file    Gets together: Not on file    Attends religious service: Not on file    Active member of club or organization: Not on file    Attends meetings of clubs or organizations: Not on file    Relationship status: Not on file  . Intimate partner violence:    Fear of current or ex partner: Not on file    Emotionally abused: Not on file    Physically abused: Not on file    Forced sexual activity: Not on file  Other Topics Concern  . Not on file  Social History Narrative  . Not on file    Past Surgical Hx:  Past Surgical History:  Procedure Laterality Date  . ABDOMINAL AORTOGRAM N/A 10/01/2018   Procedure: ABDOMINAL AORTOGRAM;  Surgeon: Adrian Prows, MD;  Location: [place] [facility];  Service: Cardiovascular;  Laterality: N/A;  . CATARACT EXTRACTION W/PHACO  02/14/2012   Procedure: CATARACT EXTRACTION  PHACO AND INTRAOCULAR LENS PLACEMENT (IOC);  Surgeon: Adonis Brook, MD;  Location: [place];  Service: Ophthalmology;  Laterality: Left;  . COLONOSCOPY    . DILATION AND CURETTAGE OF UTERUS    . ESOPHAGOGASTRODUODENOSCOPY    . EYE SURGERY    . LEFT HEART CATHETERIZATION WITH CORONARY ANGIOGRAM N/A 04/09/2014   Procedure: LEFT HEART CATHETERIZATION WITH CORONARY ANGIOGRAM;  Surgeon: [name], MD;  Location: [facility] CATH [facility];  Service: Cardiovascular;  Laterality: N/A;  . LOWER EXTREMITY ANGIOGRAPHY Bilateral 10/01/2018   Procedure: LOWER EXTREMITY ANGIOGRAPHY;  Surgeon: Adrian Prows, MD;  Location: [place] [facility];  Service: Cardiovascular;  Laterality: Bilateral;  . LYMPH NODE DISSECTION Left 1970s   "neck"  . PARS PLANA VITRECTOMY  02/14/2012   Procedure: PARS PLANA VITRECTOMY WITH 23 GAUGE;  Surgeon: Adonis Brook, MD;  Location: [place];  Service: Ophthalmology;  Laterality: Left;  . PERIPHERAL VASCULAR ATHERECTOMY Right 10/01/2018   SFA WITH DRUG COATED BALLOON  . PERIPHERAL VASCULAR ATHERECTOMY Right 10/01/2018   Procedure: PERIPHERAL VASCULAR ATHERECTOMY;  Surgeon: Adrian Prows, MD;  Location: [place] [facility];  Service: Cardiovascular;  Laterality: Right;  SFA WITH DRUG COATED BALLOON  . REFRACTIVE SURGERY    . TUBAL LIGATION      Past Medical Hx:  Past Medical History:  Diagnosis Date  . Anxiety   . Arthritis    mild  . Blood transfusion 1960s   "related to having babies" (10/01/2018)  . Chronic lower back pain    hx ruptured disc;cyst on nerve ending  . CKD (chronic kidney disease), stage IV ([place])   . Coronary artery disease   . Depression   . Edema extremities    pt takes Torsemide daily  . Family history of adverse reaction to anesthesia    "I've had several people die from anesthesia in my family; malignant hyperthermia" (10/01/2018)  . GERD (gastroesophageal reflux disease)    takes Protonix daily  . Heart murmur   . History of shingles    26yrs ago  .  Hyperlipidemia    takes Crestor daily  . Hypertension    takes Amlodipine daily  . Nocturia   . Peripheral neuropathy    left  .  Sciatica    left side  . Shortness of breath    with exertion  . Sleep apnea    sleep study done in 2012;used CPAP "in the past; not anymore" (10/01/2018)  . Type II diabetes mellitus ([place])    takes Glimepiride,Metformin,and Victoza;Lantus bid  . Urinary frequency    takes Detrol daily    Past Gynecological History:  See HPi No LMP recorded. Patient is postmenopausal.  Family Hx:  Family History  Problem Relation Age of Onset  . Diabetes Mother   . Dementia Mother   . Early death Father   . Diabetes Sister   . Heart disease Sister   . Heart disease Maternal Grandfather   . Anesthesia problems Neg Hx   . Hypotension Neg Hx   . Malignant hyperthermia Neg Hx   . Pseudochol deficiency Neg Hx     Review of Systems:  Constitutional  Feels fatigued  ENT Normal appearing ears and nares bilaterally Skin/Breast  No rash, sores, jaundice, itching, dryness Cardiovascular  No chest pain, shortness of breath, or edema  Pulmonary  No cough or wheeze.  Gastro Intestinal  + abdominal bloating, emesis, inability to tolerate food Genito Urinary  No frequency, urgency, dysuria, no bleeding Musculo Skeletal  No myalgia, arthralgia, joint swelling or pain  Neurologic  No weakness, numbness, change in gait,  Psychology  No depression, anxiety, insomnia.   Vitals:  Blood pressure (!) 146/74, pulse 97, temperature 98.1 F (36.7 C), resp. rate 16, height 5\' 5"  (1.651 m), weight 160 lb 14.4 oz (73 kg), SpO2 95 %.  Physical Exam: WD in NAD Neck  Supple NROM, without any enlargements.  Lymph Node Survey No cervical supraclavicular or inguinal adenopathy Cardiovascular  Pulse normal rate, regularity and rhythm. S1 and S2 normal.  Lungs  Decreased breath sounds bibasally Skin  No rash/lesions/breakdown  Psychiatry  Alert and oriented to person,  place, and time  Abdomen  Normoactive bowel sounds, abdomen soft, non-tender and obese without evidence of hernia.  Back No CVA tenderness Genito Urinary  deferred Rectal  deferred Extremities  No bilateral cyanosis, clubbing or edema.   Thereasa Solo, MD  05/02/2019, 1:16 PM

## 2019-05-02 NOTE — Progress Notes (Signed)
Inpatient Diabetes Program Recommendations  AACE/ADA: New Consensus Statement on Inpatient Glycemic Control (2015)  Target Ranges:  Prepandial:   less than 140 mg/dL      Peak postprandial:   less than 180 mg/dL (1-2 hours)      Critically ill patients:  140 - 180 mg/dL   Lab Results  Component Value Date   GLUCAP 105 (H) 05/02/2019   HGBA1C 9.5 (H) 05/02/2019    Review of Glycemic Control Results for Mary Harding, Mary Harding (MRN [id number]) as of 05/02/2019 11:38  Ref. Range 05/01/2019 19:54 05/01/2019 23:46 05/02/2019 00:43 05/02/2019 04:12 05/02/2019 04:58 05/02/2019 08:21  Glucose-Capillary Latest Ref Range: 70 - 99 mg/dL 102 (H) 58 (L) 133 (H) 58 (L) 155 (H) 105 (H)   Diabetes history: DM2 Outpatient Diabetes medications: Lantus 24 units qd + Novolog 3-10 units tid meal coverage + Victoza 1.8 qd Current orders for Inpatient glycemic control: Novolog sensitive correction tid  Inpatient Diabetes Program Recommendations:   While pt. Is NPO, please consider Novolog correction to q 4 hrs.  Thank you, Nani Gasser. Elisabel Hanover, RN, MSN, CDE  Diabetes Coordinator Inpatient Glycemic Control Team Team Pager [phone] (8am-5pm) 05/02/2019 11:41 AM

## 2019-05-02 NOTE — Progress Notes (Signed)
Family Medicine Teaching Service Daily Progress Note Intern Pager: [phone]  Patient name: Mary Harding Medical record number: [id number] Date of birth: [birth year] Age: 85 y.o. Gender: female  Primary Care Provider: Deland Pretty, MD Consultants: gyn onc, GI Code Status: full  Pt Overview and Major Events to Date:    Assessment and Plan: Mary Harding is a 85 y.o. female presenting with consipation, nausea an dintermittint vomiting. PMH is significant for CAD, HTN, HLD, T2DM, CKD IV, anxiety/depression, arthritis, OSA, peripheral neuropathy, and PAD.  Liver Nodules  Abdominal Ascites: Acute, stable Likely d/t metastatic disease.  S/p paracentesis w/ removal of 3L. CA-125 elevated. U/s shows diffusely heterogenous liver parenchyma, ascites, and pleural effusions.  Patient's nausea has improved.     - continuous cardiac monitoring x 24 hours - follow up paracentesis labs   Dysphagia: acute, worsening Endorsed 1 month history of dysphagia to both solids and liquids with daily vomitus of food eaten. Notes food gets stuck mid-sternum. Denies any weight loss. CT abd/pelvis notable for wall thickening of the gastric body and fundus of unknown clinical significance and fluid dilation of the distal esophagus with recommendation for EGD. History of GERD on Protonix.  - SLP for swallow eval - NPO until swallow study -  GI consulted, appreciate recs  Constipation, Narrow Stools, Melena: acute, stable One week history of constipation with intermittent narrow stools. FOBT positive.  Could be related to malignancy - GI consulted, appreciate recs - senna-docusate and miralax daily - vitals per floor    Bibasilar Lung Opacities: No respiratory symptoms.  Likely exudative from her malignancy.  - continue to monitor - If respiratory status deteriorates or develops a fever, recommend repeat CXR, blood culture, and antibiotics.  Normocytic Anemia: stable Hgb 10.9 (Baseline 10). MCV 97.4. No  iron panel in chart. Home meds: Iron 325mg . Does endorse darker stool which would be consistent with iron vs upper GI bleed. Denies any active bleeding. Iron, ferritin normal.  - continue home iron supplement  AKI on CKD IV: Per chart review, patient had abdominal arteriogram in 09/2018 that revealed presence of 2 renal arteries on the right and one renal on the left, left renal artery ostium had severe calcification and 20 to 30% stenosis. Diffuse disease of the right renal arteries. Creatinine on admission 2.10 (baseline 1.0 in 2015). BUN 18. This may be new baseline for patient given T2DM, CAD, and HLD. - avoid nephrotoxic agents  - continue to monitor   Elevated Troponin: Trop 0.03 on admission.  She does have a history of CAD with PCI in 2015 showing stable stenosis, the worst being 70-80% stenosis in the circumflex coronary artery. - EKG - Trend trops,   Enlarged Ovary  Thickened Endometrial Stripe: Enlarged left ovary measuring 3 x 4.1 cm and diffuse thickening of the endometrial stripe on CT abd/pelvis. Denies pelvic pain or AUB. Likely represents ovarian cancer.  - consulted gyn onc, appreciate recs - follow up transvaginal and pelvic ultrasound  H/o CAD  PAD  Renal Artery Stenosis: Had PCI in 2015 which showed stable Circumflex coronary artery stenosis which cardiology opted to treat medically with aggressive risk modification. She had angioplasty to he right SFA in 09/2018. Home meds: Plavix 75mg  and Coreg 12.5mg  BID. In regards to her Plavix, she is instructed to take it daily, but patient reports taking it 3 times per week because of reported bleeding and finger discoloration with CBG finger sticks. - continue home meds  HTN:  normlo-mildly hypertensive this am Home  meds: Coreg 12.5mg  BID. Denies any headaches or vision changes. - continue home meds - continue to monitor  HLD: Last lipid panel notable in chart was 2015. Home meds: Crestor 10mg  QD - continue home  meds - follow up lipid panel  T2DM:  A1c 9.5. Home meds: Liragluitide 1.8mg  QD, Lantus 24U QD, Novolog Sliding scale TID. Did not have Lantus morning of admission. 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 Per patient, had fractured multiple lumbar vertebra 40-50 years ago. Has been treated with steroid injections periodicially. Home meds: Tramadol 50mg  q6hrs. She notes it does not really help. - continue home meds - Lidocaine patch and K-pad  Anxiety/Depression: Home meds: Wellbutrin 150mg  QD. Denies any SI/HI or auditory/visual hallucinations. - continue home meds  FEN/GI: NPO until swallow eval, Sips with meds Prophylaxis: Loveox   Disposition: home  Subjective:  patient has been experiencing dysphagia,with frequent vomiting with meals and decreased appetite.  but hasn't lost any weight. She says her nausea and vomiting have improved since her arrival at the hospital.    Objective: Temp:  [98 F (36.7 C)-98.4 F (36.9 C)] 98.1 F (36.7 C) (05/15 0501) Pulse Rate:  [86-108] 97 (05/15 0900) Resp:  [11-20] 16 (05/14 2321) BP: (124-168)/(62-86) 146/74 (05/15 0900) SpO2:  [95 %-100 %] 95 % (05/15 0501) Weight:  [73 kg-73.7 kg] 73 kg (05/15 0634) Physical Exam: General: alert and oriented no acute distress.  Cardiovascular: Regular rhythm. Normal rate.  No murmurs.   Respiratory: LCTAB.  No wheezes or crackles.  Abdomen: mildly distended.  Normal bowel sounds. No tenderness to palpation.   Extremities: no lower extremity edema.    Laboratory: Recent Labs  Lab 05/01/19 1714  05/02/19 0319  WBC 11.0* 11.5*  HGB 10.9* 10.7*  HCT 34.1* 33.1*  PLT 297 320   Recent Labs  Lab 05/01/19 1714 05/02/19 0319  NA 137 137  K 3.6 4.0  CL 100 106  CO2 25 24  BUN 18 16  CREATININE 2.10* 1.78*  CALCIUM 9.2 8.6*  PROT 6.1*  --   BILITOT 0.6  --   ALKPHOS 75  --   ALT 12  --   AST 16  --   GLUCOSE 145* 78      Imaging/Diagnostic Tests:   Benay Pike, MD 05/02/2019, 9:33 AM PGY-1, [place] Intern pager: [phone], text pages welcome

## 2019-05-02 NOTE — Consult Note (Addendum)
[facility] Consult: 1:09 PM 05/02/2019  LOS: 1 day    Referring Provider: Dr Andria Frames  Primary Care Physician:  Deland Pretty, MD Primary Gastroenterologist:  Dr. Jim Desanctis.       Reason for Consultation:  Dysphagia.     HPI: Mary Harding is a 85 y.o. female.  PMH Degenerative spine disease.  IDDM.  OSA.  CKD stage IV.  PVD.  S/pstent to SFA.  Chronic Plavix.   10/2004 EGD.  Esophageal biopsies positive for Barrett's. 10/2004 colonoscopy for cancer screening and C/O diarrhea.  Diverticulosis scattered throughout colon, denser in the sigmoid.  Random biopsies of grossly normal mucosa obtained, pathology showed unremarkable mucosa no features suggesting microscopic colitis. 04/2011 EGD for evaluation of GERD symptoms.  ?  Barrett's, biopsies obtained.  Pathology showed intestinal metaplasia consistent with Barrett's esophagus but no dysplasia or malignancy. Takes Dexilant and p.o. iron.   Presented to the emergency room yesterday with several weeks of dysphagia, nausea vomiting, constipation.   Globus sensation and emesis within 5 minutes of swallowing.  Symptoms started suddenly about 2 months ago.  No prodrome of previous dysphagia. Dysphagia occurs with both liquids, solids, medications.  Also for 2 months she has had progressive abdominal distention and discomfort.  Many years she has had issues with her bowels, occasional diarrhea, sometimes constipation.  In the last couple of months she has had more constipation and BMs are smaller but not bloody, occur once or twice a week.  Also she takes Dexilant daily, she still gets heartburn. Bedside swallow evaluation this morning was remarkable only for eructation during swallows, held her hand to her chest while swallowing.  No signs and symptoms of aspiration.      COVID testing negative.  Glucose 225.  WBCs 11.5.  Hgb 10.7 CT abdomen pelvis without contrast.  Large bilateral pleural effusions.  Large abdominal ascites.  Hazy omentum and mesentery without definitive peritoneal implants.  Nodular liver suggesting cirrhosis.  Cholelithiasis.  Wall thickening in the gastric body and fundus.  Dilation and fluid in the distal esophagus.  Left ovary enlarged with thickening of endometrial stripe.  Stool throughout the colon no evidence of bowel obstruction. Abdominal ultrasound.  Heterogeneous liver, no well-defined mass.  Suggestion of slight capsular nodularity. ?  Cirrhosis?  Gallstones.  Ascites.  Pleural effusion. Pelvic ultrasound: Though uterus not definitively visualized, in the region of the uterus is heterogeneous hypo-echogenicity with internal blood flow in the endometrial canal.  Prominent left ovary CA 125 is elevated.   S/p 3 L paracentesis by resident.  Fluid contains 530 WBCs, 26% PMNs.  Abdomen feels much more comfortable after the paracentesis.  No ETOH.  No fm hx of ulcers, GI cancers, liver disease.    Past Medical History:  Diagnosis Date   Anxiety    Arthritis    mild   Blood transfusion 1960s   "related to having babies" (10/01/2018)   Chronic lower back pain    hx ruptured disc;cyst on nerve ending   CKD (chronic kidney disease), stage IV ([place])  Coronary artery disease    Depression    Edema extremities    pt takes Torsemide daily   Family history of adverse reaction to anesthesia    "I've had several people die from anesthesia in my family; malignant hyperthermia" (10/01/2018)   GERD (gastroesophageal reflux disease)    takes Protonix daily   Heart murmur    History of shingles    21yrs ago   Hyperlipidemia    takes Crestor daily   Hypertension    takes Amlodipine daily   Nocturia    Peripheral neuropathy    left   Sciatica    left side   Shortness of breath    with exertion   Sleep apnea     sleep study done in 2012;used CPAP "in the past; not anymore" (10/01/2018)   Type II diabetes mellitus ([place])    takes Glimepiride,Metformin,and Victoza;Lantus bid   Urinary frequency    takes Detrol daily    Past Surgical History:  Procedure Laterality Date   ABDOMINAL AORTOGRAM N/A 10/01/2018   Procedure: ABDOMINAL AORTOGRAM;  Surgeon: Adrian Prows, MD;  Location: [place] [facility];  Service: Cardiovascular;  Laterality: N/A;   CATARACT EXTRACTION W/PHACO  02/14/2012   Procedure: CATARACT EXTRACTION PHACO AND INTRAOCULAR LENS PLACEMENT (IOC);  Surgeon: Adonis Brook, MD;  Location: [place];  Service: Ophthalmology;  Laterality: Left;   COLONOSCOPY     DILATION AND CURETTAGE OF UTERUS     ESOPHAGOGASTRODUODENOSCOPY     EYE SURGERY     LEFT HEART CATHETERIZATION WITH CORONARY ANGIOGRAM N/A 04/09/2014   Procedure: LEFT HEART CATHETERIZATION WITH CORONARY ANGIOGRAM;  Surgeon: [name], MD;  Location: [facility];  Service: Cardiovascular;  Laterality: N/A;   LOWER EXTREMITY ANGIOGRAPHY Bilateral 10/01/2018   Procedure: LOWER EXTREMITY ANGIOGRAPHY;  Surgeon: Adrian Prows, MD;  Location: [place] [facility];  Service: Cardiovascular;  Laterality: Bilateral;   LYMPH NODE DISSECTION Left 1970s   "neck"   PARS PLANA VITRECTOMY  02/14/2012   Procedure: PARS PLANA VITRECTOMY WITH 23 GAUGE;  Surgeon: Adonis Brook, MD;  Location: [place];  Service: Ophthalmology;  Laterality: Left;   PERIPHERAL VASCULAR ATHERECTOMY Right 10/01/2018   SFA WITH DRUG COATED BALLOON   PERIPHERAL VASCULAR ATHERECTOMY Right 10/01/2018   Procedure: PERIPHERAL VASCULAR ATHERECTOMY;  Surgeon: Adrian Prows, MD;  Location: [place] [facility];  Service: Cardiovascular;  Laterality: Right;  SFA WITH DRUG COATED BALLOON   REFRACTIVE SURGERY     TUBAL LIGATION      Prior to Admission medications   Medication Sig Start Date End Date Taking? Authorizing Provider  buPROPion (WELLBUTRIN XL) 150 MG 24 hr tablet Take  150 mg by mouth daily. 08/05/18  Yes [provider]  carvedilol (COREG) 12.5 MG tablet Take 12.5 mg by mouth 2 (two) times daily with a meal.   Yes [provider]  clopidogrel (PLAVIX) 75 MG tablet Take 1 tablet (75 mg total) by mouth daily with breakfast. Patient taking differently: Take 75 mg by mouth every other day.  04/10/14  Yes Adrian Prows, MD  dexlansoprazole (DEXILANT) 60 MG capsule Take 60 mg by mouth daily.   Yes [provider]  diclofenac sodium (VOLTAREN) 1 % GEL Apply 1 application topically 4 (four) times daily as needed (for pain- apply to affected sites).    Yes [provider]  EPINEPHrine (EPIPEN) 0.3 mg/0.3 mL SOAJ injection Inject 0.3 mg into the muscle once as needed for anaphylaxis (for bee stings).  Yes [provider]  ferrous sulfate 325 (65 FE) MG tablet Take 325 mg by mouth 2 (two) times daily.   Yes [provider]  furosemide (LASIX) 20 MG tablet Take 20 mg by mouth daily.   Yes [provider]  insulin aspart (NOVOLOG) 100 UNIT/ML injection Inject 3-10 Units into the skin 3 (three) times daily before meals.    Yes [provider]  LANTUS SOLOSTAR 100 UNIT/ML Solostar Pen Inject 24 Units into the skin. 01/01/19  Yes [provider]  Liraglutide (VICTOZA) 18 MG/3ML SOLN Inject 1.8 mg into the skin daily.    Yes [provider]  Multiple Vitamins-Minerals (MULTIVITAMIN WITH MINERALS) tablet Take 1 tablet by mouth daily.   Yes [provider]  Naphazoline HCl (CLEAR EYES OP) Place 1 drop into both eyes daily as needed (dry eyes).   Yes [provider]  nitroGLYCERIN (NITROSTAT) 0.4 MG SL tablet Place 0.4 mg under the tongue every 5 (five) minutes as needed for chest pain.   Yes [provider]  pantoprazole (PROTONIX) 40 MG tablet Take 40 mg by mouth daily.   Yes [provider]  Potassium Chloride ER 20 MEQ TBCR Take 20 mEq by mouth daily. 11/25/18   Yes [provider]  rosuvastatin (CRESTOR) 10 MG tablet Take 10 mg by mouth daily.   Yes [provider]  senna-docusate (PERI-COLACE) 8.6-50 MG tablet Take 1 tablet by mouth 2 (two) times daily.   Yes [provider]  traMADol (ULTRAM) 50 MG tablet Take 50 mg by mouth every 6 (six) hours as needed for moderate pain.   Yes [provider]  aspirin 81 MG tablet Take 1 tablet (81 mg total) by mouth daily. Patient not taking: Reported on 05/01/2019 10/02/18   Adrian Prows, MD    Scheduled Meds:  buPROPion  150 mg Oral Daily   carvedilol  12.5 mg Oral BID WC   clopidogrel  75 mg Oral Q breakfast   enoxaparin (LOVENOX) injection  30 mg Subcutaneous Q24H   insulin aspart  0-9 Units Subcutaneous TID WC   lidocaine  1 patch Transdermal Q24H   pantoprazole  40 mg Oral Daily   rosuvastatin  10 mg Oral Daily   sodium chloride flush  3 mL Intravenous Q12H   sodium chloride flush  3 mL Intravenous Q12H   Infusions:  sodium chloride     PRN Meds: sodium chloride, acetaminophen **OR** acetaminophen, ondansetron (ZOFRAN) IV, sodium chloride flush, traMADol   Allergies as of 05/01/2019 - Review Complete 05/01/2019  Allergen Reaction Noted   Bee venom Anaphylaxis 04/08/2014   Other Other (See Comments) 09/24/2018    Family History  Problem Relation Age of Onset   Diabetes Mother    Dementia Mother    Early death Father    Diabetes Sister    Heart disease Sister    Heart disease Maternal Grandfather    Anesthesia problems Neg Hx    Hypotension Neg Hx    Malignant hyperthermia Neg Hx    Pseudochol deficiency Neg Hx     Social History   Socioeconomic History   Marital status: Widowed    Spouse name: Not on file   Number of children: Not on file   Years of education: Not on file   Highest education level: Not on file  Occupational History   Not on file  Social Needs   Financial resource strain: Not on file   Food  insecurity:    Worry:  Not on file    Inability: Not on file   Transportation needs:    Medical: Not on file    Non-medical: Not on file  Tobacco Use   Smoking status: Former Smoker    Packs/day: 1.00    Years: 15.00    Pack years: 15.00    Types: Cigarettes    Last attempt to quit: 01/18/1997    Years since quitting: 22.2   Smokeless tobacco: Never Used  Substance and Sexual Activity   Alcohol use: Not Currently   Drug use: Never   Sexual activity: Not Currently  Lifestyle   Physical activity:    Days per week: Not on file    Minutes per session: Not on file   Stress: Not on file  Relationships   Social connections:    Talks on phone: Not on file    Gets together: Not on file    Attends religious service: Not on file    Active member of club or organization: Not on file    Attends meetings of clubs or organizations: Not on file    Relationship status: Not on file   Intimate partner violence:    Fear of current or ex partner: Not on file    Emotionally abused: Not on file    Physically abused: Not on file    Forced sexual activity: Not on file  Other Topics Concern   Not on file  Social History Narrative   Not on file    REVIEW OF SYSTEMS: Constitutional: Feels tired. ENT:  No nose bleeds Pulm: No trouble breathing, no cough.  Her back pain limits her ambulation significantly so she does not exert herself much. CV:  No palpitations, no LE edema.  Chest pain. GU:  No hematuria, no frequency GI: Per HPI. Heme: No unusual or excessive bleeding or bruising. Transfusions: Blood transfusions about 50 years ago. Neuro:  No headaches, no peripheral tingling or numbness.  Dizziness, no syncope. MS:   Chronic pain in her spine. Derm:  No itching, no rash or sores.  Endocrine:  No sweats or chills.  No polyuria or dysuria Immunization: Not queried. Travel:  None in last several months.   PHYSICAL EXAM: Vital signs in last 24 hours: Vitals:   05/02/19 0501  05/02/19 0900  BP: 124/62 (!) 146/74  Pulse: 89 97  Resp:    Temp: 98.1 F (36.7 C)   SpO2: 95%    Wt Readings from Last 3 Encounters:  05/02/19 73 kg  10/25/18 77.1 kg  10/02/18 76.4 kg    General: Very pleasant, gracious, alert, currently comfortable.  Does not look ill. Head: Facial asymmetry or swelling.  No signs of head trauma. Eyes: No conjunctival pallor.  No scleral icterus.  EOMI. Ears: Not hard of hearing. Nose: Congestion or discharge. Mouth: Moist, clear, pink oral mucosa.  Tongue midline. Neck: No JVD, no masses, no thyromegaly. Lungs: Clear bilaterally.  Not short of breath.  No cough. Heart: RRR.  No MRG.  S1, S2 present. Abdomen: Soft.  Nontender.  Active bowel sounds.  No distention.  Do not appreciate organomegaly, masses, bruits, hernias..   Rectal: Deferred. Musc/Skeltl: No joint redness, swelling, contractures. Extremities: No CCE. Neurologic: Excellent historian.  Alert and oriented x3.  Moves all 4 limbs, strength not tested.  No tremors.  No gross deficits. Skin: No rash, sores, telangiectasia, significant purpura/bruising. Tattoos: None Nodes: No cervical adenopathy. Psych: Pleasant, cooperative, calm.  Intake/Output from previous day: No intake/output data recorded. Intake/Output  this shift: No intake/output data recorded.  LAB RESULTS: Recent Labs    05/01/19 1714 05/02/19 0319  WBC 11.0* 11.5*  HGB 10.9* 10.7*  HCT 34.1* 33.1*  PLT 297 320   BMET Lab Results  Component Value Date   NA 137 05/02/2019   NA 137 05/01/2019   NA 140 04/09/2014   K 4.0 05/02/2019   K 3.6 05/01/2019   K 4.0 04/09/2014   CL 106 05/02/2019   CL 100 05/01/2019   CL 105 04/09/2014   CO2 24 05/02/2019   CO2 25 05/01/2019   CO2 20 04/09/2014   GLUCOSE 78 05/02/2019   GLUCOSE 145 (H) 05/01/2019   GLUCOSE 236 (H) 04/09/2014   BUN 16 05/02/2019   BUN 18 05/01/2019   BUN 12 04/09/2014   CREATININE 1.78 (H) 05/02/2019   CREATININE 2.10 (H) 05/01/2019    CREATININE 1.13 (H) 04/09/2014   CALCIUM 8.6 (L) 05/02/2019   CALCIUM 9.2 05/01/2019   CALCIUM 9.0 04/09/2014   LFT Recent Labs    05/01/19 1714  PROT 6.1*  ALBUMIN 2.8*  AST 16  ALT 12  ALKPHOS 75  BILITOT 0.6   PT/INR Lab Results  Component Value Date   INR 1.1 05/01/2019   INR 0.83 04/08/2014   Hepatitis Panel No results for input(s): HEPBSAG, HCVAB, HEPAIGM, HEPBIGM in the last 72 hours. C-Diff No components found for: CDIFF Lipase  No results found for: LIPASE  Drugs of Abuse  No results found for: LABOPIA, COCAINSCRNUR, LABBENZ, AMPHETMU, THCU, LABBARB   RADIOLOGY STUDIES: Ct Abdomen Pelvis Wo Contrast  Result Date: 05/01/2019 CLINICAL DATA:  Abdominal pain. Constipation x5 days with nausea and vomiting. EXAM: CT ABDOMEN AND PELVIS WITHOUT CONTRAST TECHNIQUE: Multidetector CT imaging of the abdomen and pelvis was performed following the standard protocol without IV contrast. COMPARISON:  None. FINDINGS: Lower chest: There are moderate to large bilateral pleural effusions. Bibasilar atelectasis is noted. Calcifications are noted of the mitral valve and coronary arteries. Atherosclerotic changes are noted of the thoracic aorta. Hepatobiliary: The liver contour appears somewhat nodular. There is no discrete hepatic mass, however evaluation is significantly limited in the absence of IV contrast. There is cholelithiasis without definite CT evidence of acute cholecystitis. The common bile duct is not well appreciated but there does not appear to be overt biliary ductal dilatation. Pancreas: Unremarkable. No pancreatic ductal dilatation or surrounding inflammatory changes. Spleen: Normal in size without focal abnormality. Adrenals/Urinary Tract: No hydronephrosis. No radiopaque kidney stones. The bladder is decompressed which limits evaluations. The adrenal glands are unremarkable. Stomach/Bowel: There is scattered colonic diverticula without CT evidence of diverticulitis. There is  a moderate amount of stool in the colon. The appendix is located in the right lower quadrant and is unremarkable. There is no bowel obstruction. There appears to be some tethering of the small bowel loops, which are mostly centrally located. There appears to be diffuse wall thickening of the gastric body and fundus. The esophagus is dilated and fluid-filled. There are few low-attenuation cystic structures in the upper abdomen (axial series 2, image 39) which are not well evaluated. These could represent mildly dilated loops of small bowel. Vascular/Lymphatic: Aortic atherosclerosis. No enlarged abdominal or pelvic lymph nodes. Reproductive: The left ovary appears enlarged for the patient's age measuring approximately 4.1 by 3 cm. The right ovary is unremarkable. There appears to be some diffuse thickening of the endometrial canal, however this is suboptimally evaluated secondary to lack of IV contrast. Other: There is a large volume  of abdominal ascites. There is some haziness of the mesentery. There are no definite peritoneal implants, however evaluation is significantly limited by lack of IV contrast. Musculoskeletal: No acute or significant osseous findings. IMPRESSION: 1. Moderate to large bilateral pleural effusions. 2. Moderate to large volume of abdominal ascites. There is some haziness of the omentum and mesentery without definite peritoneal implants. However, evaluation is limited by lack of IV contrast. Fluid evaluation of the ascites is recommended to help exclude a malignant cause. 3. Mildly nodular appearance to the liver which can be seen in patients with cirrhosis. 4. Cholelithiasis without CT evidence of acute cholecystitis. 5. Wall thickening of the gastric body and fundus of unknown clinical significance. In addition, there is fluid dilation of the distal esophagus. Further evaluation with EGD should be considered. 6. Enlarged left ovary measuring 3 x 4.1 cm. Further evaluation with ultrasound is  recommended. In addition, there appears to be diffuse thickening of the endometrial stripe. This should be further evaluated with ultrasound. 7. Moderate amount of stool throughout the colon. No evidence of a small-bowel obstruction. No CT evidence of diverticulitis. Aortic Atherosclerosis (ICD10-I70.0). Electronically Signed   By: Constance Holster M.D.   On: 05/01/2019 13:42   US Abdomen Complete  Result Date: 05/02/2019 CLINICAL DATA:  Liver lesions.  Ascites. EXAM: ABDOMEN ULTRASOUND COMPLETE COMPARISON:  Abdominal CT yesterday. FINDINGS: Gallbladder: Physiologically distended. Multiple gallstones largest measuring 11 mm. No gallbladder wall thickening. No sonographic Murphy sign noted by sonographer. Common bile duct: Diameter: 5 mm, normal. Liver: Diffusely heterogeneous parenchyma without well-defined focal lesion. Suggestion of slight capsular nodularity. Portal vein is patent on color Doppler imaging with normal direction of blood flow towards the liver. IVC: No abnormality visualized. Pancreas: Visualized portion unremarkable. Spleen: Slightly small in size.  No focal abnormality. Right Kidney: Length: 9.8 cm. Echogenicity within normal limits. No mass or hydronephrosis visualized. Left Kidney: Length: 8.6 cm. Echogenicity within normal limits. No mass or hydronephrosis visualized. Abdominal aorta: No aneurysm visualized. Other findings: Ascites and bilateral pleural effusions. IMPRESSION: 1. Diffusely heterogeneous liver parenchyma without well-defined mass. Suggestion of slight capsular nodularity. Recommend correlation for cirrhosis risk factors. 2. Gallstones without sonographic findings of acute cholecystitis. 3. Ascites and pleural effusions. Electronically Signed   By: Keith Rake M.D.   On: 05/02/2019 03:19   Dg Chest Portable 1 View  Result Date: 05/01/2019 CLINICAL DATA:  Dyspnea EXAM: PORTABLE CHEST 1 VIEW COMPARISON:  04/08/2014 chest radiograph. FINDINGS: Stable cardiomediastinal  silhouette with normal heart size. No pneumothorax. Small bilateral pleural effusions. No pulmonary edema. Hazy right parahilar and bibasilar lung opacities. IMPRESSION: 1. Small bilateral pleural effusions. 2. Hazy right parahilar and bibasilar lung opacities, which could represent atelectasis and/or pneumonia. Electronically Signed   By: Ilona Sorrel M.D.   On: 05/01/2019 17:28   US Pelvic Complete With Transvaginal  Result Date: 05/02/2019 CLINICAL DATA:  [facility] year old with enlarged left ovary on CT. EXAM: TRANSABDOMINAL AND TRANSVAGINAL ULTRASOUND OF PELVIS TECHNIQUE: Both transabdominal and transvaginal ultrasound examinations of the pelvis were performed. Transabdominal technique was performed for global imaging of the pelvis including uterus, ovaries, adnexal regions, and pelvic cul-de-sac. It was necessary to proceed with endovaginal exam following the transabdominal exam to visualize the uterus, ovaries, and adnexa. COMPARISON:  CT yesterday. FINDINGS: Technically limited evaluation. Uterus Normal postmenopausal uterus is not visualized. What is tentatively identified in the expected location of the uterus is abnormal for a postmenopausal patient. Measurements: 3.4 x 2.4 x 3 cm = volume: 12.8  mL. Heterogeneous hypoechogenicity with internal blood flow in the endometrial canal but no definite myometrial lesions or fibroids. Endometrium Assuming above structures the uterus, thickness: 16 mm. Heterogeneously hypoechoic endometrium with internal flow. Right ovary Not visualized.  No adnexal mass. Left ovary Measurements: 1.6 x 1.2 x 1.9 cm = volume: 2 mL. No dominant cystic or solid mass. Other findings No abnormal free fluid. IMPRESSION: 1. Technically limited sonographic evaluation of the pelvis. Structure in the expected location of the uterus is abnormal for postmenopausal patient with heterogeneous hypoechoic and thickened endometrium. Recommend gynecologic consultation and possible further evaluation  with MRI. Endometrial malignancy is considered. 2. Prominent left ovary for postmenopausal patient but no dominant cystic or solid mass defined by ultrasound. Electronically Signed   By: Keith Rake M.D.   On: 05/02/2019 02:36     IMPRESSION:   *   Dysphagia.  Distal esophageal dilation and retained fluid, gastric wall thickening per CT.  Hx Barrett's esophagus on EGDs in 2005 and 2012.   Takes Dexilant daily, on Protonix as inpt.  .    *    Constipation. FOBT + On Miralax, Senokot now  *     Nodular liver, ? Cirrhosis vs widespread mets.   Normal coags.  Normal platelets, normal LFTs.    *    Chronic Plavix.  Last dose had been 5/12 but was restarted this AM at 0900   *   Normocytic anemia.    *   AKI, CKD.    *   Normocytic anemia.  Iron, ferritin, iron saturation levels normal.  TIBC low.   PLAN:     *    EGD and colonoscopy ~ Tuesday.  Because she received Plavix this AM, need to wait 5 days from last Plavix.  Given her signif dysphagia, she may require an NGT to infuse bowel prep.   Dulcolax PR now and in AM.   Clears.   Stopped Plavix.  Will need to hold Lovenox for 24 hours before EGD/Colonoscopy   Given constipation, going forward may help pt to stop po Iron and if/when Iron dificency arises, treat with Feraheme.    [name]  05/02/2019, 1:09 PM Phone [phone]

## 2019-05-02 NOTE — Discharge Summary (Signed)
[facility] Discharge Summary  Patient name: KAROLYN MESSING Medical record number: [id number] Date of birth: 28-Nov-1934 Age: 85 y.o. Gender: female Date of Admission: 05/01/2019  Date of Discharge: 05/08/2019 Admitting Physician: Leeanne Rio, MD  Primary Care Provider: Deland Pretty, MD Consultants: Palliative, Gyn/Onc, GI  Indication for Hospitalization: Constipation, Dysphagia  Discharge Diagnoses/Problem List:  Adenocarcinoma Dysphagia Melena Ascites Narrowed stools Malignant neoplasm of ovary Acute renal failure superimposed on chronic kidney disease T2DM Liver nodules  CAD HLD  Disposition: Home with Home Hospice  Discharge Condition: stable  Discharge Exam: General: very pleasant older lady, in no acute distress with non-toxic appearance, sitting up comfortably in bed with labored breathing HEENT: normocephalic, atraumatic, moist mucous membranes Neck: supple, normal ROM CV: regular rate and rhythm without murmurs, rubs, or gallops, no lower extremity edema, 2+ radial and pedal pulses bilaterally Lungs: crackles appreciated bilaterally, course breath sounds with some transmitted upper respiratory sounds, labored breathing with 3L O2 Abdomen: soft, non-tender, non-distended, normoactive bowel sounds Skin: warm, dry Extremities: warm and well perfused Neuro: Alert and oriented, speech normal   Brief Hospital Course:  The patient presented to the hospital after several weeks of nausea and vomiting, dark narrow stools, along with dysphagia.  CT scan of chest/bbd/pelvis showed several abnormalities including capsular nodular liver without well-defined mass, ascites and pleural effusions, thickened endometrial stripe, and enlarged left ovary.  Abdominal ultrasound showed a thickened endometrium. CA-125 elevated, CEA WNL.  Gynecological oncology was consulted and said it was likely ovarian primary cancer with metastasis.   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 Gyn/Onc recommended neoajuvant chemotherapy with a platinum doublet to be administered as an outpatient. However during admission, palliative care was consulted and after full discussion with family, patient opted to undergo full comfort care. Per gyn/onc, prognosis is estimated to be <6 months, however likely less given co-morbidities including renal failure, DM, and dysphagia.   GI was consulted for evaluation of dysphagia and melena given positive FOBT.  They performed an EGD/colonoscopy on 5/19, however exam was limited due to anesthesia side effects. Overall results were negative for apparent malignancy. She was noted to have reflux esophagitis, esophageal reflux and bilious gastric fluid with non-obstructing distal esophageal stricture. Colonoscopy was significant for non-bleeding external hemorrhoids, severe diverticulosis in sigmoid/descending colon without diverticular bleed. Patient was started on Prontonix 40mg  BID x 8 weeks and Carafate 1g QID x 2 weeks. She was recommended to have a follow up EGD with dilation after 8 weeks of PPI and reglan to facilitate gastric emptying if patient opted to do so. Patient was discharged on bowel regimen for continued comfort.  Finally, patient was noted to have bilateral pleaural effusions on imaging. Her SOB worsened during admission with concern for aspiration PNA following EGD. CXR notable for L>R ground glass opacities. Patient was started on Augmentin 500mg  BID x 7 days (5/20-5/26). Patient was ambulated prior to discharge and was deemed to require oxygen with ambulation. DME order for home oxygen placed at time of discharge.  Patient was discharged home with home hospice with DME orders placed and RN to see her the evening of discharge. Family was notified and were in preparation to come home to see her. At time of discharge, patient was  hemodynamically stable and afebrile.   Issues for Follow Up:  1. Ensure resolution of PNA s/p 7 days of antibiotics 2. Continue to ensure comfort care is being maintained for  patient 3. Recommend GI follow up if patient opts for therapeutic intervention for dysphagia. At time of discharge, patient was unable to tolerate PO due to dysphagia.  Significant Procedures: Abdominal Paracentesis, CT abdomen/pelvis without contrast, Abdominal ultrasound, Pelvic ultrasound  Significant Labs and Imaging:  Recent Labs  Lab 05/06/19 0234 05/06/19 2123 05/07/19 0342  WBC 10.7* 24.0* 18.8*  HGB 10.3* 10.3* 9.8*  HCT 33.5* 32.5* 31.1*  PLT 377 412* 400   Recent Labs  Lab 05/01/19 1714  05/03/19 0548 05/04/19 0856 05/05/19 0233 05/06/19 0234 05/07/19 0342  NA 137   < > 136 130* 136 141 140  K 3.6   < > 3.9 4.1 3.8 3.9 4.4  CL 100   < > 100 100 107 111 109  CO2 25   < > 23 19* 22 17* 19*  GLUCOSE 145*   < > 142* 270* 117* 244* 182*  BUN 18   < > 14 14 12 10 15   CREATININE 2.10*   < > 2.04* 2.09* 1.74* 1.99* 2.59*  CALCIUM 9.2   < > 8.8* 8.1* 7.9* 8.3* 8.7*  ALKPHOS 75  --   --   --   --   --   --   AST 16  --   --   --   --   --   --   ALT 12  --   --   --   --   --   --   ALBUMIN 2.8*  --   --   --   --   --   --    < > = values in this interval not displayed.    Trop 0.03>0.04>0.03 BNP 47.2 LA 1.1 CA-125: 594 COVID: neg FOBT: positive Acuities gram Stain: WBC, no organisms Ascites glucose: 151 Ascites cell count: WNL Ascites cytology: METASTATIC ADENOCARCINOMA, CONSISTENT WITH GYNECOLOGIC PRIMARY - positive for CK7 and PAX8 Ascites culture: NG x 3 days Ascites Albumin: 2.1 Hgb A1C: 9.5 Lipid panel: Chol 94, Trig 121, HDL 32, LDL 38 Iron 24, TIBC 172 (L), Ferritin 185 HCV AB: <0.1 CEA: 1.7 HBsAg: negative HBcAb: negative HBsAb: <3.1 (non-immune) Hep A Ab: positive  Ct Abdomen Pelvis Wo Contrast  Result Date: 05/01/2019 CLINICAL DATA:  Abdominal pain. Constipation x5  days with nausea and vomiting. EXAM: CT ABDOMEN AND PELVIS WITHOUT CONTRAST TECHNIQUE: Multidetector CT imaging of the abdomen and pelvis was performed following the standard protocol without IV contrast. COMPARISON:  None. FINDINGS: Lower chest: There are moderate to large bilateral pleural effusions. Bibasilar atelectasis is noted. Calcifications are noted of the mitral valve and coronary arteries. Atherosclerotic changes are noted of the thoracic aorta. Hepatobiliary: The liver contour appears somewhat nodular. There is no discrete hepatic mass, however evaluation is significantly limited in the absence of IV contrast. There is cholelithiasis without definite CT evidence of acute cholecystitis. The common bile duct is not well appreciated but there does not appear to be overt biliary ductal dilatation. Pancreas: Unremarkable. No pancreatic ductal dilatation or surrounding inflammatory changes. Spleen: Normal in size without focal abnormality. Adrenals/Urinary Tract: No hydronephrosis. No radiopaque kidney stones. The bladder is decompressed which limits evaluations. The adrenal glands are unremarkable. Stomach/Bowel: There is scattered colonic diverticula without CT evidence of diverticulitis. There is a moderate amount of stool in the colon. The appendix is located in the right lower quadrant and is unremarkable. There is no bowel obstruction. There appears to be some tethering of the small bowel loops, which are mostly centrally  located. There appears to be diffuse wall thickening of the gastric body and fundus. The esophagus is dilated and fluid-filled. There are few low-attenuation cystic structures in the upper abdomen (axial series 2, image 39) which are not well evaluated. These could represent mildly dilated loops of small bowel. Vascular/Lymphatic: Aortic atherosclerosis. No enlarged abdominal or pelvic lymph nodes. Reproductive: The left ovary appears enlarged for the patient's age measuring  approximately 4.1 by 3 cm. The right ovary is unremarkable. There appears to be some diffuse thickening of the endometrial canal, however this is suboptimally evaluated secondary to lack of IV contrast. Other: There is a large volume of abdominal ascites. There is some haziness of the mesentery. There are no definite peritoneal implants, however evaluation is significantly limited by lack of IV contrast. Musculoskeletal: No acute or significant osseous findings. IMPRESSION: 1. Moderate to large bilateral pleural effusions. 2. Moderate to large volume of abdominal ascites. There is some haziness of the omentum and mesentery without definite peritoneal implants. However, evaluation is limited by lack of IV contrast. Fluid evaluation of the ascites is recommended to help exclude a malignant cause. 3. Mildly nodular appearance to the liver which can be seen in patients with cirrhosis. 4. Cholelithiasis without CT evidence of acute cholecystitis. 5. Wall thickening of the gastric body and fundus of unknown clinical significance. In addition, there is fluid dilation of the distal esophagus. Further evaluation with EGD should be considered. 6. Enlarged left ovary measuring 3 x 4.1 cm. Further evaluation with ultrasound is recommended. In addition, there appears to be diffuse thickening of the endometrial stripe. This should be further evaluated with ultrasound. 7. Moderate amount of stool throughout the colon. No evidence of a small-bowel obstruction. No CT evidence of diverticulitis. Aortic Atherosclerosis (ICD10-I70.0). Electronically Signed   By: Constance Holster M.D.   On: 05/01/2019 13:42   Dg Chest 2 View  Result Date: 05/06/2019 CLINICAL DATA:  Difficulty breathing EXAM: CHEST - 2 VIEW COMPARISON:  05/01/2019, 10/09/2017 FINDINGS: Small moderate bilateral left greater than right pleural effusions. Asymmetric left greater than right interstitial and ground-glass opacity. Normal heart size. No pneumothorax.  IMPRESSION: Small moderate left greater than right pleural effusions. Mild bibasilar airspace disease with development of left greater than right ground-glass airspace opacity, suspicious for pneumonia. Could consider atypical or viral pneumonia in the appropriate clinical setting. Electronically Signed   By: Donavan Foil M.D.   On: 05/06/2019 16:18   US Abdomen Complete  Result Date: 05/02/2019 CLINICAL DATA:  Liver lesions.  Ascites. EXAM: ABDOMEN ULTRASOUND COMPLETE COMPARISON:  Abdominal CT yesterday. FINDINGS: Gallbladder: Physiologically distended. Multiple gallstones largest measuring 11 mm. No gallbladder wall thickening. No sonographic Murphy sign noted by sonographer. Common bile duct: Diameter: 5 mm, normal. Liver: Diffusely heterogeneous parenchyma without well-defined focal lesion. Suggestion of slight capsular nodularity. Portal vein is patent on color Doppler imaging with normal direction of blood flow towards the liver. IVC: No abnormality visualized. Pancreas: Visualized portion unremarkable. Spleen: Slightly small in size.  No focal abnormality. Right Kidney: Length: 9.8 cm. Echogenicity within normal limits. No mass or hydronephrosis visualized. Left Kidney: Length: 8.6 cm. Echogenicity within normal limits. No mass or hydronephrosis visualized. Abdominal aorta: No aneurysm visualized. Other findings: Ascites and bilateral pleural effusions. IMPRESSION: 1. Diffusely heterogeneous liver parenchyma without well-defined mass. Suggestion of slight capsular nodularity. Recommend correlation for cirrhosis risk factors. 2. Gallstones without sonographic findings of acute cholecystitis. 3. Ascites and pleural effusions. Electronically Signed   By: Aurther Loft.D.  On: 05/02/2019 03:19   Dg Chest Bilateral Decubitus  Result Date: 05/06/2019 CLINICAL DATA:  Difficulty breathing, vomiting EXAM: CHEST - BILATERAL DECUBITUS VIEW COMPARISON:  05/06/2019, 05/01/2019 FINDINGS: Moderate layering  bilateral pleural effusions. Underlying ground-glass opacity within the left greater than right lungs. IMPRESSION: Moderate layering bilateral pleural effusions. Electronically Signed   By: Donavan Foil M.D.   On: 05/06/2019 16:15   Dg Chest Portable 1 View  Result Date: 05/01/2019 CLINICAL DATA:  Dyspnea EXAM: PORTABLE CHEST 1 VIEW COMPARISON:  04/08/2014 chest radiograph. FINDINGS: Stable cardiomediastinal silhouette with normal heart size. No pneumothorax. Small bilateral pleural effusions. No pulmonary edema. Hazy right parahilar and bibasilar lung opacities. IMPRESSION: 1. Small bilateral pleural effusions. 2. Hazy right parahilar and bibasilar lung opacities, which could represent atelectasis and/or pneumonia. Electronically Signed   By: Ilona Sorrel M.D.   On: 05/01/2019 17:28   Dg Abd Portable 1v  Result Date: 05/05/2019 CLINICAL DATA:  Enteric tube placement EXAM: PORTABLE ABDOMEN - 1 VIEW COMPARISON:  None. FINDINGS: The tip of the enteric tube projects over the stomach. The tip is pointed distally. The bowel gas pattern is nonspecific. IMPRESSION: NG tube tip projects over the gastric body. The tip is pointed distally. Electronically Signed   By: Constance Holster M.D.   On: 05/05/2019 17:06   US Pelvic Complete With Transvaginal  Result Date: 05/02/2019 CLINICAL DATA:  [facility] year old with enlarged left ovary on CT. EXAM: TRANSABDOMINAL AND TRANSVAGINAL ULTRASOUND OF PELVIS TECHNIQUE: Both transabdominal and transvaginal ultrasound examinations of the pelvis were performed. Transabdominal technique was performed for global imaging of the pelvis including uterus, ovaries, adnexal regions, and pelvic cul-de-sac. It was necessary to proceed with endovaginal exam following the transabdominal exam to visualize the uterus, ovaries, and adnexa. COMPARISON:  CT yesterday. FINDINGS: Technically limited evaluation. Uterus Normal postmenopausal uterus is not visualized. What is tentatively identified in  the expected location of the uterus is abnormal for a postmenopausal patient. Measurements: 3.4 x 2.4 x 3 cm = volume: 12.8 mL. Heterogeneous hypoechogenicity with internal blood flow in the endometrial canal but no definite myometrial lesions or fibroids. Endometrium Assuming above structures the uterus, thickness: 16 mm. Heterogeneously hypoechoic endometrium with internal flow. Right ovary Not visualized.  No adnexal mass. Left ovary Measurements: 1.6 x 1.2 x 1.9 cm = volume: 2 mL. No dominant cystic or solid mass. Other findings No abnormal free fluid. IMPRESSION: 1. Technically limited sonographic evaluation of the pelvis. Structure in the expected location of the uterus is abnormal for postmenopausal patient with heterogeneous hypoechoic and thickened endometrium. Recommend gynecologic consultation and possible further evaluation with MRI. Endometrial malignancy is considered. 2. Prominent left ovary for postmenopausal patient but no dominant cystic or solid mass defined by ultrasound. Electronically Signed   By: Keith Rake M.D.   On: 05/02/2019 02:36     Results/Tests Pending at Time of Discharge: None  Discharge Medications:  Allergies as of 05/08/2019      Reactions   Bee Venom Anaphylaxis   Other Other (See Comments)   general anesthesia- Malignant hypothermia       Medication List    STOP taking these medications   aspirin 81 MG tablet   carvedilol 12.5 MG tablet Commonly known as:  COREG   clopidogrel 75 MG tablet Commonly known as:  Plavix   dexlansoprazole 60 MG capsule Commonly known as:  DEXILANT   ferrous sulfate 325 (65 FE) MG tablet   insulin aspart 100 UNIT/ML injection Commonly known as:  novoLOG   Lantus SoloStar 100 UNIT/ML Solostar Pen Generic drug:  Insulin Glargine   multivitamin with minerals tablet   Potassium Chloride ER 20 MEQ Tbcr   rosuvastatin 10 MG tablet Commonly known as:  CRESTOR   traMADol 50 MG tablet Commonly known as:  ULTRAM    Victoza 18 MG/3ML Soln injection Generic drug:  Liraglutide     TAKE these medications   alum & mag hydroxide-simeth 014-103-01 MG/5ML suspension Commonly known as:  MAALOX/MYLANTA Take 30 mLs by mouth every 4 (four) hours as needed for indigestion.   amoxicillin-clavulanate 500-125 MG tablet Commonly known as:  AUGMENTIN Take 1 tablet (500 mg total) by mouth 2 (two) times a day for 5 days.   buPROPion 150 MG 24 hr tablet Commonly known as:  WELLBUTRIN XL Take 150 mg by mouth daily.   CLEAR EYES OP Place 1 drop into both eyes daily as needed (dry eyes).   diclofenac sodium 1 % Gel Commonly known as:  VOLTAREN Apply 1 application topically 4 (four) times daily as needed (for pain- apply to affected sites).   EpiPen 0.3 mg/0.3 mL Soaj injection Generic drug:  EPINEPHrine Inject 0.3 mg into the muscle once as needed for anaphylaxis (for bee stings).   furosemide 20 MG tablet Commonly known as:  LASIX Take 2 tablets (40 mg total) by mouth as needed for fluid (or shortness of breath). What changed:    how much to take  when to take this  reasons to take this   LORazepam 1 MG tablet Commonly known as:  ATIVAN Take 1 tablet (1 mg total) by mouth every 4 (four) hours as needed for up to 1 day for anxiety or sleep.   nitroGLYCERIN 0.4 MG SL tablet Commonly known as:  NITROSTAT Place 0.4 mg under the tongue every 5 (five) minutes as needed for chest pain.   ondansetron 4 MG tablet Commonly known as:  Zofran Take 1 tablet (4 mg total) by mouth daily as needed for up to 5 days for nausea or vomiting.   oxyCODONE 5 MG/5ML solution Commonly known as:  ROXICODONE Take 5 mLs (5 mg total) by mouth every 4 (four) hours as needed for up to 1 day for severe pain.   pantoprazole 40 MG tablet Commonly known as:  PROTONIX Take 1 tablet (40 mg total) by mouth 2 (two) times daily. What changed:  when to take this   Peri-Colace 8.6-50 MG tablet Generic drug:  senna-docusate Take  1 tablet by mouth 2 (two) times daily.   polyethylene glycol 17 g packet Commonly known as:  MIRALAX / GLYCOLAX Take 17 g by mouth 2 (two) times daily.   sucralfate 1 g tablet Commonly known as:  CARAFATE Take 1 tablet (1 g total) by mouth 4 (four) times daily -  with meals and at bedtime.       Discharge Instructions: Please refer to Patient Instructions section of EMR for full details.  Patient was counseled important signs and symptoms that should prompt return to medical care, changes in medications, dietary instructions, activity restrictions, and follow up appointments.   Mina Marble [place], DO 05/08/2019, 12:54 PM PGY-1, [place]

## 2019-05-02 NOTE — Progress Notes (Signed)
Patient arrived to the unit via bed from the emergency department. Patient is alert and oriented x 4.  No compliants of pain.  Skin assessment complete.  Skin dry and intact.  Some redness at the right antecubital where an IV was removed in the ED.  Patient placed on how cardiac monitoring.  Educated the patient on how to reach the staff on the unit for assistance.  Patient has $546 in purse which was counted by this nurse and patient with nurse tech Rosanne Ashing to witness.  Patient stated she will keep the money and meds at bedside and her daughter will pick up in the morning . Lowered the bed, placed the call light within reach and activated the bed alarm. Will continue to monitor the patient.

## 2019-05-02 NOTE — Plan of Care (Signed)
  Problem: Education: Goal: Knowledge of General Education information will improve Description: Including pain rating scale, medication(s)/side effects and non-pharmacologic comfort measures Outcome: Progressing   Problem: Activity: Goal: Risk for activity intolerance will decrease Outcome: Progressing   Problem: Pain Managment: Goal: General experience of comfort will improve Outcome: Progressing   

## 2019-05-02 NOTE — Progress Notes (Signed)
Hypoglycemic Event  CBG: 58    Treatment: D50 25 mL (12.5 gm)  Symptoms: None  Follow-up CBG: Time: CBG Result 155  Possible Reasons for Event: Inadequate meal intake/ NPO  Comments/MD notified: Dr. Venia Minks  Will continue to monitor the patient   Mary Harding

## 2019-05-02 NOTE — Progress Notes (Signed)
Notified MD Daughter [name] [phone] and requesting updated from MD if no answer then try Duaine Dredge at  [phone].

## 2019-05-03 ENCOUNTER — Inpatient Hospital Stay (HOSPITAL_COMMUNITY): Payer: Medicare Other

## 2019-05-03 DIAGNOSIS — R188 Other ascites: Secondary | ICD-10-CM

## 2019-05-03 DIAGNOSIS — E43 Unspecified severe protein-calorie malnutrition: Secondary | ICD-10-CM

## 2019-05-03 DIAGNOSIS — R195 Other fecal abnormalities: Secondary | ICD-10-CM

## 2019-05-03 LAB — CBC
HCT: 34.8 % — ABNORMAL LOW (ref 36.0–46.0)
Hemoglobin: 10.9 g/dL — ABNORMAL LOW (ref 12.0–15.0)
MCH: 30.2 pg (ref 26.0–34.0)
MCHC: 31.3 g/dL (ref 30.0–36.0)
MCV: 96.4 fL (ref 80.0–100.0)
Platelets: 324 10*3/uL (ref 150–400)
RBC: 3.61 MIL/uL — ABNORMAL LOW (ref 3.87–5.11)
RDW: 14.1 % (ref 11.5–15.5)
WBC: 9.4 10*3/uL (ref 4.0–10.5)
nRBC: 0 % (ref 0.0–0.2)

## 2019-05-03 LAB — BASIC METABOLIC PANEL
Anion gap: 13 (ref 5–15)
BUN: 14 mg/dL (ref 8–23)
CO2: 23 mmol/L (ref 22–32)
Calcium: 8.8 mg/dL — ABNORMAL LOW (ref 8.9–10.3)
Chloride: 100 mmol/L (ref 98–111)
Creatinine, Ser: 2.04 mg/dL — ABNORMAL HIGH (ref 0.44–1.00)
GFR calc Af Amer: 26 mL/min — ABNORMAL LOW (ref >60)
GFR calc non Af Amer: 22 mL/min — ABNORMAL LOW (ref >60)
Glucose, Bld: 142 mg/dL — ABNORMAL HIGH (ref 70–99)
Potassium: 3.9 mmol/L (ref 3.5–5.1)
Sodium: 136 mmol/L (ref 135–145)

## 2019-05-03 LAB — GLUCOSE, BODY FLUID OTHER: Glucose, Body Fluid Other: 151 mg/dL

## 2019-05-03 LAB — ECHOCARDIOGRAM COMPLETE
Height: 65 in
Weight: 2694.9 oz

## 2019-05-03 LAB — HCV AB W REFLEX TO QUANT PCR: HCV Ab: <0.1 s/co ratio (ref 0.0–0.9)

## 2019-05-03 LAB — HCV INTERPRETATION

## 2019-05-03 LAB — GLUCOSE, CAPILLARY
Glucose-Capillary: 128 mg/dL — ABNORMAL HIGH (ref 70–99)
Glucose-Capillary: 135 mg/dL — ABNORMAL HIGH (ref 70–99)
Glucose-Capillary: 138 mg/dL — ABNORMAL HIGH (ref 70–99)
Glucose-Capillary: 209 mg/dL — ABNORMAL HIGH (ref 70–99)
Glucose-Capillary: 297 mg/dL — ABNORMAL HIGH (ref 70–99)

## 2019-05-03 LAB — CEA: CEA: 1.7 ng/mL (ref 0.0–4.7)

## 2019-05-03 MED ORDER — SODIUM CHLORIDE 0.9 % IV SOLN: 250.0000 mL | INTRAVENOUS | Status: DC | PRN

## 2019-05-03 MED ORDER — SODIUM CHLORIDE 0.9 % IV BOLUS
1000.0000 mL | Freq: Once | INTRAVENOUS | Status: AC
  Administered 2019-05-03: 1000 mL via INTRAVENOUS

## 2019-05-03 MED ORDER — SODIUM CHLORIDE 0.9 % IV SOLN
INTRAVENOUS | Status: DC
  Administered 2019-05-03 – 2019-05-06 (×6): via INTRAVENOUS

## 2019-05-03 MED ORDER — ALUM & MAG HYDROXIDE-SIMETH 200-200-20 MG/5ML PO SUSP
30.0000 mL | ORAL | Status: DC | PRN
  Administered 2019-05-03 – 2019-05-06 (×6): 30 mL via ORAL
  Filled 2019-05-03 (×7): qty 30

## 2019-05-03 NOTE — Progress Notes (Signed)
Family Medicine Teaching Service Daily Progress Note Intern Pager: [phone]  Patient name: Mary Harding Medical record number: [id number] Date of birth: 1934/12/04 Age: 85 y.o. Gender: female  Primary Care Provider: Deland Pretty, MD Consultants: GI, gynecological oncology, SLP Code Status: Full  Pt Overview and Major Events to Date:  5/14: Patient admitted to [facility] after paracentesis completed in ED 5/15: Seen by gynecological oncology  Assessment and Plan: Mary Harding a 85 y.o.femalepresenting with consipation, nausea an dintermittint vomiting. PMH is significant forCAD, HTN, HLD, T2DM, CKD IV, anxiety/depression, arthritis, OSA, peripheral neuropathy, and PAD.  Abdominal ascites  liver nodules  pelvic masses: Acute, stable Likely 2/2 metastatic disease.  Status post paracentesis with removal of 3 L.  Ca1 25 elevated.  GI recommending MRI of liver however patient with worsening renal function likely secondary to dehydration in setting of dysphasia.  -Gynecological oncology following, appreciate recommendations -Gastroenterology following, appreciate recommendations -Follow-up paracentesis findings  Dysphagia:acute, worsening Endorsed 1 month history of dysphagia to both solids and liquids with daily vomitus of food eaten. Notes food gets stuck mid-sternum. Denies any weight loss. CT abd/pelvis notable for wall thickening of the gastric body and fundus of unknown clinical significanceandfluid dilation of the distal esophagus with recommendation for EGD. History of GERD on Protonix.  -SLP following -GI consulted, appreciate recommendations -N.p.o. at midnight pending EGD -NPO at midnight for possible EGD, will follow-up with GI to ensure this is happening -Will start MIVF at 125 mL's per hour  Constipation,Narrow Stools, Melena: acute, stable One week history of constipation with intermittent narrow stools. FOBT positive.  Could be related to malignancy - GI  consulted, appreciate recs - senna-docusate and miralax daily - vitals per floor  Bibasilar Lung Opacities: No respiratory symptoms.  Likely exudative from her malignancy.  - continue to monitor - If respiratory status deteriorates or develops a fever, recommend repeat CXR, blood culture, and antibiotics.  Normocytic Anemia: stable Hgb 10.9 (Baseline 10). MCV 97.4. No iron panel in chart.Home meds: Iron 325mg . Does endorse darker stool which would be consistent with iron vs upper GI bleed. Denies any active bleeding. Iron, ferritin normal.  -Holding home iron supplementation in setting of constipation  AKI on CKD IV: Patient with worsening creatinine likely secondary to dehydration. Creatinine on admission 2.10 (baseline 1.0in 2015). BUN 18. Unclear if patient's new baseline however does have risk factors for worsening kidney disease including CAD, T2DM. -Trend BMP / urinary output -Will start MIVF at 125 mL's per hour -Replace electrolytes as indicated -Avoid nephrotoxic agents, ensure adequate renal perfusion  Enlarged Ovary  Thickened Endometrial Stripe: Enlarged left ovary measuring 3 x 4.1 cmanddiffuse thickening of the endometrial stripe on CT abd/pelvis. Denies pelvic pain or AUB. Likely represents ovarian cancer. Pelvic ultrasound shows heterogeneous, hypoechoic and thickened endometrium with recommend for further evaluation by MRI.  Prominent left ovary. - consulted gyn onc, appreciate recs  H/o CAD  PAD Renal Artery Stenosis: Had PCI in 2015 which showed stable Circumflexcoronary arterystenosis which cardiology opted to treat medically with aggressive risk modification. She had angioplasty to he right SFA in 09/2018.Home meds: Plavix 75mg and Coreg 12.5mg BID. In regards to her Plavix, she is instructedto take itdaily, but patient reports taking it 3 times per week because of reported bleeding and finger discoloration with CBG finger sticks. - continue home  meds  HTN: normlo-mildly hypertensive this am Home meds: Coreg 12.5mg  BID. Denies any headaches or vision changes. - continue home meds - continue to monitor  HLD: Last  lipid panelnotable in chart was 2015. Home meds: Crestor 10mg  QD - continue home meds - follow up lipid panel  T2DM: A1c 9.5. Home meds: Liragluitide 1.8mg  QD, Lantus 24U QD, NovologSliding scaleTID. 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 Per patient, had fractured multiple lumbar vertebra 40-50 years ago.Has been treated with steroid injections periodicially.Home meds: Tramadol 50mg  q6hrs. She notes it does not really help. - continue home meds - Lidocaine patch and K-pad  Anxiety/Depression: Home meds: Wellbutrin 150mg  QD. Denies any SI/HI or auditory/visual hallucinations. - continue home meds  FEN/GI:CLD, NPO at midnight Prophylaxis:Loveox  Disposition: Pending medical work-up  Subjective:  Had very long conversation with this morning with patient discussing how she does not want to die and has a very close no family.  She does state that she is religious though and has attempted to talk with her family multiple times regarding her death.  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  Patient is also in room having retching after trying to eat breakfast.  She states she has not had anything in 3 days.  She has been trying to eat and drink but has been unable to given her continued dysphasia.  States that she is quite  uncomfortable.  She is hopeful that they will do the EGD tomorrow, which she reports that the GI doctor has told her will happen.  Patient reports that she would like for her son Mary Harding and her daughter Mary Harding to be called daily for updates.  Objective: Temp:  [98 F (36.7 C)-98.5 F (36.9 C)] 98.1 F (36.7 C) (05/16 0520) Pulse Rate:  [85-87] 87 (05/16 0520) Resp:  [18] 18 (05/15 1406) BP: (118-139)/(56-97) 129/56 (05/16 0520) SpO2:  [95 %-98 %] 96 % (05/16 0520) Weight:  [76.4 kg] 76.4 kg (05/16 0522) Physical Exam: General: NAD, pleasant Cardiovascular: no LE edema Respiratory: normal work of breathing MSK: moves 4 extremities equally Derm: no rashes appreciated Neuro: CN II-XII grossly intact Psych: AOx3, appropriate affect  Laboratory: Recent Labs  Lab 05/01/19 1714 05/02/19 0319 05/03/19 0548  WBC 11.0* 11.5* 9.4  HGB 10.9* 10.7* 10.9*  HCT 34.1* 33.1* 34.8*  PLT 297 320 324   Recent Labs  Lab 05/01/19 1714 05/02/19 0319 05/03/19 0548  NA 137 137 136  K 3.6 4.0 3.9  CL 100 106 100  CO2 25 24 23   BUN 18 16 14   CREATININE 2.10* 1.78* 2.04*  CALCIUM 9.2 8.6* 8.8*  PROT 6.1*  --   --   BILITOT 0.6  --   --   ALKPHOS 75  --   --   ALT 12  --   --   AST 16  --   --   GLUCOSE 145* 78 142*   Imaging/Diagnostic Tests:  Pelvic US Complete: 05/02/2019 IMPRESSION: 1. Technically limited sonographic evaluation of the pelvis. Structure in the expected location of the uterus is abnormal for postmenopausal patient with heterogeneous hypoechoic and thickened endometrium. Recommend gynecologic consultation and possible further evaluation with MRI. Endometrial malignancy  is considered. 2. Prominent left ovary for postmenopausal patient but no dominant cystic or solid mass defined by ultrasound.  Abdominal US Complete: 05/02/2019 IMPRESSION: 1. Diffusely heterogeneous liver parenchyma without well-defined mass. Suggestion of slight capsular nodularity.  Recommend correlation for cirrhosis risk factors. 2. Gallstones without sonographic findings of acute cholecystitis. 3. Ascites and pleural effusions.  Aaryana Betke, [place], DO 05/03/2019, 11:16 AM PGY-2, [facility] Intern pager: [phone], text pages welcome

## 2019-05-03 NOTE — Progress Notes (Addendum)
Called and updated family per patient request including [name] at [phone] and [name] at [phone].  Patient is requesting that family be updated daily if possible.  [place] Pamalee Marcoe, DO PGY-2, [facility]

## 2019-05-03 NOTE — Progress Notes (Signed)
[facility] Progress Note  CC:  Dysphagia, constipation   Subjective: She complains of mild generalized abdominal soreness. She passed some solid and loose stool. Received a Dulcolax suppository this am. No rectal bleeding or melena. Tolerating clear liquids slowly, sometimes liquids get stuck to the mid esophagus for a few seconds, she burps then fluid goes down.  Objective:  Vital signs in last 24 hours: Temp:  [98 F (36.7 C)-98.5 F (36.9 C)] 98.1 F (36.7 C) (05/16 0520) Pulse Rate:  [85-87] 87 (05/16 0520) Resp:  [18] 18 (05/15 1406) BP: (118-139)/(56-97) 129/56 (05/16 0520) SpO2:  [95 %-98 %] 96 % (05/16 0520) Weight:  [76.4 kg] 76.4 kg (05/16 0522) Last BM Date: 04/25/19 General:   Alert,  Well-developed,    in NAD Heart:  Regular rate and rhythm; no murmurs Pulm: Lungs clear throughout.  Abdomen: Soft, mild generalized tenderness without rebound or guarding, + BS x 4 quads. Extremities:  Without edema. Neurologic:  Alert and  oriented x4;  grossly normal neurologically. Psych:  Alert and cooperative. Normal mood and affect.  Intake/Output from previous day: No intake/output data recorded. Intake/Output this shift: No intake/output data recorded.  Lab Results: Recent Labs    05/01/19 1714 05/02/19 0319 05/03/19 0548  WBC 11.0* 11.5* 9.4  HGB 10.9* 10.7* 10.9*  HCT 34.1* 33.1* 34.8*  PLT 297 320 324   BMET Recent Labs    05/01/19 1714 05/02/19 0319 05/03/19 0548  NA 137 137 136  K 3.6 4.0 3.9  CL 100 106 100  CO2 25 24 23   GLUCOSE 145* 78 142*  BUN 18 16 14   CREATININE 2.10* 1.78* 2.04*  CALCIUM 9.2 8.6* 8.8*   LFT Recent Labs    05/01/19 1714  PROT 6.1*  ALBUMIN 2.8*  AST 16  ALT 12  ALKPHOS 75  BILITOT 0.6   PT/INR Recent Labs    05/01/19 1714  LABPROT 14.5  INR 1.1   Hepatitis Panel No results for input(s): HEPBSAG, HCVAB, HEPAIGM, HEPBIGM in the last 72 hours.  Ct Abdomen Pelvis Wo Contrast  Result Date:  05/01/2019 CLINICAL DATA:  Abdominal pain. Constipation x5 days with nausea and vomiting. EXAM: CT ABDOMEN AND PELVIS WITHOUT CONTRAST TECHNIQUE: Multidetector CT imaging of the abdomen and pelvis was performed following the standard protocol without IV contrast. COMPARISON:  None. FINDINGS: Lower chest: There are moderate to large bilateral pleural effusions. Bibasilar atelectasis is noted. Calcifications are noted of the mitral valve and coronary arteries. Atherosclerotic changes are noted of the thoracic aorta. Hepatobiliary: The liver contour appears somewhat nodular. There is no discrete hepatic mass, however evaluation is significantly limited in the absence of IV contrast. There is cholelithiasis without definite CT evidence of acute cholecystitis. The common bile duct is not well appreciated but there does not appear to be overt biliary ductal dilatation. Pancreas: Unremarkable. No pancreatic ductal dilatation or surrounding inflammatory changes. Spleen: Normal in size without focal abnormality. Adrenals/Urinary Tract: No hydronephrosis. No radiopaque kidney stones. The bladder is decompressed which limits evaluations. The adrenal glands are unremarkable. Stomach/Bowel: There is scattered colonic diverticula without CT evidence of diverticulitis. There is a moderate amount of stool in the colon. The appendix is located in the right lower quadrant and is unremarkable. There is no bowel obstruction. There appears to be some tethering of the small bowel loops, which are mostly centrally located. There appears to be diffuse wall thickening of the gastric body and fundus. The esophagus is dilated and fluid-filled. There  are few low-attenuation cystic structures in the upper abdomen (axial series 2, image 39) which are not well evaluated. These could represent mildly dilated loops of small bowel. Vascular/Lymphatic: Aortic atherosclerosis. No enlarged abdominal or pelvic lymph nodes. Reproductive: The left ovary  appears enlarged for the patient's age measuring approximately 4.1 by 3 cm. The right ovary is unremarkable. There appears to be some diffuse thickening of the endometrial canal, however this is suboptimally evaluated secondary to lack of IV contrast. Other: There is a large volume of abdominal ascites. There is some haziness of the mesentery. There are no definite peritoneal implants, however evaluation is significantly limited by lack of IV contrast. Musculoskeletal: No acute or significant osseous findings. IMPRESSION: 1. Moderate to large bilateral pleural effusions. 2. Moderate to large volume of abdominal ascites. There is some haziness of the omentum and mesentery without definite peritoneal implants. However, evaluation is limited by lack of IV contrast. Fluid evaluation of the ascites is recommended to help exclude a malignant cause. 3. Mildly nodular appearance to the liver which can be seen in patients with cirrhosis. 4. Cholelithiasis without CT evidence of acute cholecystitis. 5. Wall thickening of the gastric body and fundus of unknown clinical significance. In addition, there is fluid dilation of the distal esophagus. Further evaluation with EGD should be considered. 6. Enlarged left ovary measuring 3 x 4.1 cm. Further evaluation with ultrasound is recommended. In addition, there appears to be diffuse thickening of the endometrial stripe. This should be further evaluated with ultrasound. 7. Moderate amount of stool throughout the colon. No evidence of a small-bowel obstruction. No CT evidence of diverticulitis. Aortic Atherosclerosis (ICD10-I70.0). Electronically Signed   By: Constance Holster M.D.   On: 05/01/2019 13:42   US Abdomen Complete  Result Date: 05/02/2019 CLINICAL DATA:  Liver lesions.  Ascites. EXAM: ABDOMEN ULTRASOUND COMPLETE COMPARISON:  Abdominal CT yesterday. FINDINGS: Gallbladder: Physiologically distended. Multiple gallstones largest measuring 11 mm. No gallbladder wall  thickening. No sonographic Murphy sign noted by sonographer. Common bile duct: Diameter: 5 mm, normal. Liver: Diffusely heterogeneous parenchyma without well-defined focal lesion. Suggestion of slight capsular nodularity. Portal vein is patent on color Doppler imaging with normal direction of blood flow towards the liver. IVC: No abnormality visualized. Pancreas: Visualized portion unremarkable. Spleen: Slightly small in size.  No focal abnormality. Right Kidney: Length: 9.8 cm. Echogenicity within normal limits. No mass or hydronephrosis visualized. Left Kidney: Length: 8.6 cm. Echogenicity within normal limits. No mass or hydronephrosis visualized. Abdominal aorta: No aneurysm visualized. Other findings: Ascites and bilateral pleural effusions. IMPRESSION: 1. Diffusely heterogeneous liver parenchyma without well-defined mass. Suggestion of slight capsular nodularity. Recommend correlation for cirrhosis risk factors. 2. Gallstones without sonographic findings of acute cholecystitis. 3. Ascites and pleural effusions. Electronically Signed   By: Keith Rake M.D.   On: 05/02/2019 03:19   Dg Chest Portable 1 View  Result Date: 05/01/2019 CLINICAL DATA:  Dyspnea EXAM: PORTABLE CHEST 1 VIEW COMPARISON:  04/08/2014 chest radiograph. FINDINGS: Stable cardiomediastinal silhouette with normal heart size. No pneumothorax. Small bilateral pleural effusions. No pulmonary edema. Hazy right parahilar and bibasilar lung opacities. IMPRESSION: 1. Small bilateral pleural effusions. 2. Hazy right parahilar and bibasilar lung opacities, which could represent atelectasis and/or pneumonia. Electronically Signed   By: Ilona Sorrel M.D.   On: 05/01/2019 17:28   US Pelvic Complete With Transvaginal  Result Date: 05/02/2019 CLINICAL DATA:  [facility] year old with enlarged left ovary on CT. EXAM: TRANSABDOMINAL AND TRANSVAGINAL ULTRASOUND OF PELVIS TECHNIQUE: Both transabdominal and  transvaginal ultrasound examinations of the pelvis  were performed. Transabdominal technique was performed for global imaging of the pelvis including uterus, ovaries, adnexal regions, and pelvic cul-de-sac. It was necessary to proceed with endovaginal exam following the transabdominal exam to visualize the uterus, ovaries, and adnexa. COMPARISON:  CT yesterday. FINDINGS: Technically limited evaluation. Uterus Normal postmenopausal uterus is not visualized. What is tentatively identified in the expected location of the uterus is abnormal for a postmenopausal patient. Measurements: 3.4 x 2.4 x 3 cm = volume: 12.8 mL. Heterogeneous hypoechogenicity with internal blood flow in the endometrial canal but no definite myometrial lesions or fibroids. Endometrium Assuming above structures the uterus, thickness: 16 mm. Heterogeneously hypoechoic endometrium with internal flow. Right ovary Not visualized.  No adnexal mass. Left ovary Measurements: 1.6 x 1.2 x 1.9 cm = volume: 2 mL. No dominant cystic or solid mass. Other findings No abnormal free fluid. IMPRESSION: 1. Technically limited sonographic evaluation of the pelvis. Structure in the expected location of the uterus is abnormal for postmenopausal patient with heterogeneous hypoechoic and thickened endometrium. Recommend gynecologic consultation and possible further evaluation with MRI. Endometrial malignancy is considered. 2. Prominent left ovary for postmenopausal patient but no dominant cystic or solid mass defined by ultrasound. Electronically Signed   By: Keith Rake M.D.   On: 05/02/2019 02:36    Assessment / Plan:  1. 85 y.o. female with dysphagia. CT showed distal esophageal dilatation, retained fluid and gastric wall thickening. Hx of Barrett's esophagus. -Plan for EGD Tuesday 5/19 -continue PPI -continue clear liquid diet  -IV fluid per hospitalist   2. Questionable cirrhosis, CT shows nodular liver. LFTs and PLT WNLs. S/P paracentesis 5/14, waiting for ascitic albumin level to calculate SAAG.  Will need abdominal MRI when renal status improves.    3.  Anemia. Previously taking po iron. Hg 10.9 << 10.7. MCV 96.4. Iron 34. TIBC 172. Ferritin 185.  -EGD and colonoscopy with 2 day prep on 5/19, will need to hold Lovenox for 12 to 24hrs prior to procedure. Last dose of Plavix was 5/15. -follow H/H closely  -transfuse for Hg < 7  4. AKI. BUN 14. Cr. 2.04 >> 1.78 -IV NS 1 L bolus then NS @ b125cc/hr as ordered by the hospitalist   5.  Cholelithiasis   6. Enlarged left ovary, thickening endometrial stripe concerning for malignant process -Gyn consult done by Dr. Denman George 5/15, patient to proceed with EGD prior to further gyn evaluation.  -Await SAAG result, if SAAG < 1.1 cirrhosis most likely due to malignancy    7. CAD on Plavix, past PTCA per patient, no stents or MI. BNP 47.2. -recommend checking ECHO        LOS: 2 days   Noralyn Pick  05/03/2019, 11:32 AM

## 2019-05-03 NOTE — Progress Notes (Signed)
  Echocardiogram 2D Echocardiogram has been performed.  Mary Harding 05/03/2019, 4:59 PM

## 2019-05-04 LAB — BASIC METABOLIC PANEL
Anion gap: 11 (ref 5–15)
BUN: 14 mg/dL (ref 8–23)
CO2: 19 mmol/L — ABNORMAL LOW (ref 22–32)
Calcium: 8.1 mg/dL — ABNORMAL LOW (ref 8.9–10.3)
Chloride: 100 mmol/L (ref 98–111)
Creatinine, Ser: 2.09 mg/dL — ABNORMAL HIGH (ref 0.44–1.00)
GFR calc Af Amer: 25 mL/min — ABNORMAL LOW (ref >60)
GFR calc non Af Amer: 22 mL/min — ABNORMAL LOW (ref >60)
Glucose, Bld: 270 mg/dL — ABNORMAL HIGH (ref 70–99)
Potassium: 4.1 mmol/L (ref 3.5–5.1)
Sodium: 130 mmol/L — ABNORMAL LOW (ref 135–145)

## 2019-05-04 LAB — ALBUMIN, PLEURAL OR PERITONEAL FLUID: Albumin, Fluid: 2.1 g/dL

## 2019-05-04 LAB — GLUCOSE, CAPILLARY
Glucose-Capillary: 109 mg/dL — ABNORMAL HIGH (ref 70–99)
Glucose-Capillary: 164 mg/dL — ABNORMAL HIGH (ref 70–99)
Glucose-Capillary: 190 mg/dL — ABNORMAL HIGH (ref 70–99)
Glucose-Capillary: 243 mg/dL — ABNORMAL HIGH (ref 70–99)
Glucose-Capillary: 266 mg/dL — ABNORMAL HIGH (ref 70–99)

## 2019-05-04 MED ORDER — SODIUM CHLORIDE 0.9 % IV BOLUS
1000.0000 mL | Freq: Once | INTRAVENOUS | Status: AC
  Administered 2019-05-04: 1000 mL via INTRAVENOUS

## 2019-05-04 MED ORDER — HYDROCODONE-ACETAMINOPHEN 5-325 MG PO TABS
1.0000 | ORAL_TABLET | ORAL | Status: DC | PRN
  Administered 2019-05-04 – 2019-05-06 (×5): 1 via ORAL
  Filled 2019-05-04 (×6): qty 1

## 2019-05-04 MED ORDER — GLYCERIN (LAXATIVE) 2.1 G RE SUPP
1.0000 | Freq: Once | RECTAL | Status: AC
  Administered 2019-05-04: 11:00:00 1 via RECTAL
  Filled 2019-05-04: qty 1

## 2019-05-04 MED ORDER — INSULIN ASPART 100 UNIT/ML ~~LOC~~ SOLN
0.0000 [IU] | SUBCUTANEOUS | Status: DC
  Administered 2019-05-04: 2 [IU] via SUBCUTANEOUS
  Administered 2019-05-04: 12:00:00 5 [IU] via SUBCUTANEOUS
  Administered 2019-05-04: 21:00:00 2 [IU] via SUBCUTANEOUS
  Administered 2019-05-05 (×3): 1 [IU] via SUBCUTANEOUS

## 2019-05-04 MED ORDER — GLYCERIN (LAXATIVE) 2.1 G RE SUPP
1.0000 | Freq: Every day | RECTAL | Status: DC | PRN
  Filled 2019-05-04: qty 1

## 2019-05-04 MED ORDER — POLYETHYLENE GLYCOL 3350 17 G PO PACK
17.0000 g | PACK | Freq: Two times a day (BID) | ORAL | Status: DC
  Administered 2019-05-04 – 2019-05-08 (×4): 17 g via ORAL
  Filled 2019-05-04 (×7): qty 1

## 2019-05-04 NOTE — Progress Notes (Addendum)
Family Medicine Teaching Service Daily Progress Note Intern Pager: [phone]  Patient name: Mary Harding Medical record number: [id number] Date of birth: 1934/12/08 Age: 85 y.o. Gender: female  Primary Care Provider: Deland Pretty, MD Consultants: GI, gynecological oncology, SLP Code Status: Full  Pt Overview and Major Events to Date:  5/14: Patient admitted to [facility] after paracentesis completed in ED 5/15: Seen by gynecological oncology, GI, Abdominal and pelvic u/s 5/16: Echo: EF 55-60%  Assessment and Plan: Mary Harding a 85 y.o.femalepresenting with consipation, nausea an dintermittint vomiting. PMH is significant forCAD, HTN, HLD, T2DM, CKD IV, anxiety/depression, arthritis, OSA, peripheral neuropathy, and PAD.  Abdominal ascites  liver nodules  pelvic masses: Acute, stable CT with nodular liver, LFTs PLt WNL. Likely 2/2 metastatic disease.  S/p paracentesis with removal of 3 L on 5/14. Peritoneal fluid culture with no growth x 3 days. Cell counts unremarkable. Cytology pending. Ca125 elevated, CEA WNL. Hepatitis panel pending. SAAG 0.7 (<1.1), thus ascites not 2/2 to portal HTN and consistent with suggested malignancy. Cr 1.7 today. - GI recs: MRI once Cr 1.2-1.4 - Gynecological oncology following, appreciate recommendations -Gastroenterology following, appreciate recommendations - Follow-up paracentesis findings - Hold Lovenox for procedure, SCD's in place for VTE prophylaxis   Dysphagia:acute, worsening Endorsed 1 month history of dysphagia to both solids and liquids with daily vomitus of food eaten. Experiencing some still with clear liquid diet. Denies any further vomiting since admission. Awaiting Plavix washout before EGD, which is planned to occur on 5/19. - SLP following - GI consulted: plan for EGD/colonscopy on 5/19, bowel prep per GI, NG tube for bowel prep to bypass dysphagia  - Clear liquids, NPO in AM - continue MIVF at 125 mL/hr - Hold Lovenox for  procedure, SCD's in place for VTE prophylaxis  - continue protonix  Constipation,Narrow Stools, Melena: acute, stable One week history of constipation with intermittent narrow stools. Differential includes malignancy given FOBT positive. Patient notes large soft BM overnight. No further melena and denies an bright red blood per rectum. Feeling much better since. Denies any further abdominal pain, nausea. - GI consulted: likely get colonoscopy with EGD on 5/19 - Bowel prep per GI - vitals per floor - Hold Lovenox for procedure, SCD's in place for VTE prophylaxis   Hyponatremia: improved Na 136>130 (corrected to 133 for hyperglycemia >136 - sSSI and Q4H CBG  Bibasilar Lung Opacities: No respiratory symptoms.  Likely exudative from her malignancy. Echo with EF 55-60%.  Endorses some SOB with exertion, otherwise denies any SOB. - continue to monitor - If respiratory status deteriorates or develops a fever, recommend repeat CXR, blood culture, and antibiotics.  Normocytic Anemia: stable Hgb 10.9>9.5 (Baseline 10). MCV 97.4. Iron panel WNL. Likely from slow bleed given FOBT+.Home meds: Iron 325mg .  - Holding home iron supplementation in setting of constipation - Plan for EGD/Colonoscopy on 5/19 - Daily CBC - transfusion threshold <7  AKI on CKD IV: Cr 2.10 on admission (baseline 1.0 in 2015). Cr this AM 1.74. Unclear if patient's new baseline vs dehydration however does have risk factors for worsening kidney disease including CAD, T2DM. - Trend BMP / urinary output - continue 161mL NS MIVF - Replace electrolytes as indicated - Avoid nephrotoxic agents, ensure adequate renal perfusion  Enlarged Ovary  Thickened Endometrial Stripe: Enlarged left ovary measuring 3 x 4.1 cmanddiffuse thickening of the endometrial stripe on CT abd/pelvis. Denies pelvic pain or AUB. Likely represents ovarian cancer. Pelvic ultrasound shows heterogeneous, hypoechoic and thickened endometrium with  recommend for further evaluation by MRI.  Prominent left ovary. - consulted gyn onc, appreciate recs: follow up after GI workup - Per Gyn, if gyn primary - best tx neoadjuvant chemotherapy +/- interval debulking if good clinical reponse  H/o CAD  PAD Renal Artery Stenosis: Had PCI in 2015 which showed stable Circumflexcoronary arterystenosis which cardiology opted to treat medically with aggressive risk modification. She had angioplasty to he right SFA in 09/2018.Echo 5/16 with EF 55-60%, mild LVH, mod-sev mitral annular calcification. Home meds: Plavix 75mg and Coreg 12.5mg BID. - Hold plavix for 5 day washout for procedure on 5/19 - Will hold Lovenox ON in preparation for procedure, SCDs for VTE prophylaxes  - continue home Coreg  HTN: BP this AM 151/67. Home meds: Coreg 12.5mg  BID. Denies any headaches or vision changes. - continue home meds - continue to monitor  HLD: Lipid panel WNL. Home meds: Crestor 10mg  QD - continue home meds  T2DM: A1c 9.5. Home meds: Liragluitide 1.8mg  QD, Lantus 24U QD, NovologSliding scaleTID. CBGs 110-266 ON - CBG q4 - sSSI  GERD:  Home med: Protonix 40mg  QD - continue Protonix 40mg  QD  Chronic back pain: stable Home meds: Tramadol 50mg  q6hrs. She notes it does not really help. Notes much improvement in pain with Norco! - Hold home tramadol given no improvement - Continue Norco 5-325 q4 PRN - Lidocaine patch and K-pad  Anxiety/Depression: Home meds: Wellbutrin 150mg  QD. Denies any SI/HI or auditory/visual hallucinations. - continue home meds  FEN/GI:Clear liquid diet, NPO in AM of 5/19 Prophylaxis: SCDs, Lovenox held in prep for 5/19 procedure  Disposition: Pending medical work-up  Subjective:  Patient notes doing really well this AM. First time she felt great in some time. Pain was well controlled. Did lots of walking yesterday. Had some SOB late last night but denies any this AM. Notes she had a large bowel movement  this AM and feels much better.  She is not in any pain.  Objective: Temp:  [97.7 F (36.5 C)-98.1 F (36.7 C)] 97.7 F (36.5 C) (05/18 0519) Pulse Rate:  [67-79] 79 (05/18 0837) Resp:  [18] 18 (05/18 0519) BP: (122-151)/(54-67) 151/67 (05/18 0837) SpO2:  [95 %] 95 % (05/18 0519) Weight:  [77.4 kg] 77.4 kg (05/18 0600) Physical Exam: General: pleasant elderly lady, well nourished, well developed, in no acute distress with non-toxic appearance, lying comfortably in bed HEENT: normocephalic, atraumatic, moist mucous membranes Neck: supple, normal ROM CV: regular rate and rhythm without murmurs, rubs, or gallops, no lower extremity edema, 2+ radial and pedal pulses bilaterally Lungs: clear to auscultation bilaterally with normal work of breathing, fine crackles in lower lobes Abdomen: soft, non-tender, non-distended, normoactive bowel sounds Skin: warm, dry Extremities: warm and well perfused, no LE edema  Laboratory: Recent Labs  Lab 05/02/19 0319 05/03/19 0548 05/05/19 0233  WBC 11.5* 9.4 8.1  HGB 10.7* 10.9* 9.5*  HCT 33.1* 34.8* 30.1*  PLT 320 324 303   Recent Labs  Lab 05/01/19 1714  05/03/19 0548 05/04/19 0856 05/05/19 0233  NA 137   < > 136 130* 136  K 3.6   < > 3.9 4.1 3.8  CL 100   < > 100 100 107  CO2 25   < > 23 19* 22  BUN 18   < > 14 14 12   CREATININE 2.10*   < > 2.04* 2.09* 1.74*  CALCIUM 9.2   < > 8.8* 8.1* 7.9*  PROT 6.1*  --   --   --   --  BILITOT 0.6  --   --   --   --   ALKPHOS 75  --   --   --   --   ALT 12  --   --   --   --   AST 16  --   --   --   --   GLUCOSE 145*   < > 142* 270* 117*   < > = values in this interval not displayed.   Trop 0.03>0.04>0.03 BNP 47.2 LA 1.1 CA-125: 594 COVID: neg FOBT: positive Acuities gram Stain: WBC, no organisms Ascites glucose: 151 Ascites cell count: WNL Ascites cytology: pending Ascites culture: NG x 3 days Ascites Albumin: 2.1 Hgb A1C: 9.5 Lipid panel: Chol 94, Trig 121, HDL 32, LDL 38 Iron  24, TIBC 172 (L), Ferritin 185 HCV AB: <0.1 CEA: 1.7 HBsAg: pending HBcAb: pending BHsAb: pending  Urinalysis    Component Value Date/Time   COLORURINE YELLOW 05/01/2019 2005   APPEARANCEUR CLEAR 05/01/2019 2005   LABSPEC 1.009 05/01/2019 2005   PHURINE 6.0 05/01/2019 2005   GLUCOSEU NEGATIVE 05/01/2019 2005   HGBUR SMALL (A) 05/01/2019 2005   [place] NEGATIVE 05/01/2019 2005   [place] 05/01/2019 2005   PROTEINUR 30 (A) 05/01/2019 2005   NITRITE NEGATIVE 05/01/2019 2005   LEUKOCYTESUR TRACE (A) 05/01/2019 2005   Imaging/Diagnostic Tests: Pelvic US Complete: 05/02/2019 IMPRESSION: 1. Technically limited sonographic evaluation of the pelvis. Structure in the expected location of the uterus is abnormal for postmenopausal patient with heterogeneous hypoechoic and thickened endometrium. Recommend gynecologic consultation and possible further evaluation with MRI. Endometrial malignancy is considered. 2. Prominent left ovary for postmenopausal patient but no dominant cystic or solid mass defined by ultrasound.  Abdominal US Complete: 05/02/2019 IMPRESSION: 1. Diffusely heterogeneous liver parenchyma without well-defined mass. Suggestion of slight capsular nodularity. Recommend correlation for cirrhosis risk factors. 2. Gallstones without sonographic findings of acute cholecystitis. 3. Ascites and pleural effusions.  Danna Hefty, DO 05/05/2019, 9:31 AM PGY-1, [place] Intern pager: [phone], text pages welcome

## 2019-05-04 NOTE — Progress Notes (Signed)
Called to update [name]'s son [name] at [phone] and daughter [name] at [phone].  Told them that her EGD and colonoscopy will likely be on Tuesday, and they will help [place] formulate a plan to alleviate her dysphasia and constipation based on those results.  They expressed understanding and were grateful for her care and for the daily updates.  We will continue to update them daily.  Bellamarie Pflug C. Shan Levans, MD PGY-2, [facility] 05/04/2019 12:53 PM

## 2019-05-04 NOTE — Progress Notes (Signed)
Dr. Jeannine Kitten notified of patient c/o her breath being short and abd pain.

## 2019-05-04 NOTE — Progress Notes (Addendum)
Family Medicine Teaching Service Daily Progress Note Intern Pager: [phone]  Patient name: Mary Harding Medical record number: [id number] Date of birth: Jun 13, 1934 Age: 85 y.o. Gender: female  Primary Care Provider: Deland Pretty, MD Consultants: GI, gynecological oncology, SLP Code Status: Full  Pt Overview and Major Events to Date:  5/14: Patient admitted to [facility] after paracentesis completed in ED 5/15: Seen by gynecological oncology  Assessment and Plan: [name] a 85 y.o.femalepresenting with consipation, nausea an dintermittint vomiting. PMH is significant forCAD, HTN, HLD, T2DM, CKD IV, anxiety/depression, arthritis, OSA, peripheral neuropathy, and PAD.  Abdominal ascites  liver nodules  pelvic masses: Acute, stable Likely 2/2 metastatic disease.  Status post paracentesis with removal of 3 L.  Ca125 elevated. Peritoneal fluid culture with no growth x 2 days.   Cell counts unremarkable.  Awaiting albumin level to calculate SAAG.  GI recommending MRI of liver however patient with continued worsening renal function likely secondary to dehydration in setting of dysphasia on 5/17. -Gynecological oncology following, appreciate recommendations -Gastroenterology following, appreciate recommendations -Follow-up paracentesis findings  Dysphagia:acute, worsening Endorsed 1 month history of dysphagia to both solids and liquids with daily vomitus of food eaten.  Awaiting Plavix washout before EGD, which will hopefully occur on 5/19. -SLP following -GI consulted, appreciate recommendations -clear liquid diet -continue MIVF at 125 mL's per hour  Constipation,Narrow Stools, Melena: acute, stable One week history of constipation with intermittent narrow stools. FOBT positive.  Could be related to malignancy - GI consulted, appreciate recs, will likely get colonoscopy with EGD on 5/19 - senna-docusate BID and miralax BID (increased from daily on 5/17) - glycerin suppository  once on 5/17 followed by daily PRN - vitals per floor  Hyponatremia: Sodium decreased to 130 on 5/17 from 136 the day prior.  Possibly due to hyperglycemia since glucose was 270 on 5/17.  Sodium is 133 when corrected for hyperglycemia. - add sSSI and Q4H CBG  Bibasilar Lung Opacities: No respiratory symptoms.  Likely exudative from her malignancy.  - continue to monitor - If respiratory status deteriorates or develops a fever, recommend repeat CXR, blood culture, and antibiotics.  Normocytic Anemia: stable Hgb 10.9 (Baseline 10). MCV 97.4. No iron panel in chart.Home meds: Iron 325mg . Does endorse darker stool which would be consistent with iron vs upper GI bleed. Denies any active bleeding. Iron, ferritin normal.  -Holding home iron supplementation in setting of constipation  AKI on CKD IV: Patient with worsening creatinine likely secondary to dehydration. Creatinine on admission 2.10 (baseline 1.0in 2015). BUN 18. Unclear if patient's new baseline however does have risk factors for worsening kidney disease including CAD, T2DM. -Trend BMP / urinary output -continue MIVF at 125 mL's per hour -Replace electrolytes as indicated -Avoid nephrotoxic agents, ensure adequate renal perfusion  Enlarged Ovary  Thickened Endometrial Stripe: Enlarged left ovary measuring 3 x 4.1 cmanddiffuse thickening of the endometrial stripe on CT abd/pelvis. Denies pelvic pain or AUB. Likely represents ovarian cancer. Pelvic ultrasound shows heterogeneous, hypoechoic and thickened endometrium with recommend for further evaluation by MRI.  Prominent left ovary. - consulted gyn onc, appreciate recs  H/o CAD  PAD Renal Artery Stenosis: Had PCI in 2015 which showed stable Circumflexcoronary arterystenosis which cardiology opted to treat medically with aggressive risk modification. She had angioplasty to he right SFA in 09/2018.Home meds: Plavix 75mg and Coreg 12.5mg BID. In regards to her Plavix, she  is instructedto take itdaily, but patient reports taking it 3 times per week because of reported bleeding and finger  discoloration with CBG finger sticks. - continue home meds  HTN: normlo-mildly hypertensive this am Home meds: Coreg 12.5mg  BID. Denies any headaches or vision changes. - continue home meds - continue to monitor  HLD: Last lipid panelnotable in chart was 2015. Home meds: Crestor 10mg  QD - continue home meds - follow up lipid panel  T2DM: A1c 9.5. Home meds: Liragluitide 1.8mg  QD, Lantus 24U QD, NovologSliding scaleTID. 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 Per patient, had fractured multiple lumbar vertebra 40-50 years ago.Has been treated with steroid injections periodicially.Home meds: Tramadol 50mg  q6hrs. She notes it does not really help. - continue home meds - Lidocaine patch and K-pad  Anxiety/Depression: Home meds: Wellbutrin 150mg  QD. Denies any SI/HI or auditory/visual hallucinations. - continue home meds  FEN/GI:CLD Prophylaxis:Loveox  Disposition: Pending medical work-up  Subjective:  Patient says she continues to have some constipation but did have a small bowel movement  and would like a suppository in addition to her other bowel regimen.  She is not in any pain.  Objective: Temp:  [97.7 F (36.5 C)-98.2 F (36.8 C)] 97.8 F (36.6 C) (05/17 0440) Pulse Rate:  [75-87] 77 (05/17 0815) Resp:  [18] 18 (05/17 0440) BP: (117-158)/(53-75) 117/53 (05/17 0815) SpO2:  [94 %-99 %] 94 % (05/17 0440) Weight:  [76.2 kg] 76.2 kg  (05/17 0559) Physical Exam: General: pleasant lady sitting up in bed Cardiac: RRR, no MRG Respiratory: no increased WOB, no rhonchi or crackles Abdomen: nontender, soft, frequent bowel sounds Extremities: warm and well perfused, no pedal edema  Laboratory: Recent Labs  Lab 05/01/19 1714 05/02/19 0319 05/03/19 0548  WBC 11.0* 11.5* 9.4  HGB 10.9* 10.7* 10.9*  HCT 34.1* 33.1* 34.8*  PLT 297 320 324   Recent Labs  Lab 05/01/19 1714 05/02/19 0319 05/03/19 0548  NA 137 137 136  K 3.6 4.0 3.9  CL 100 106 100  CO2 25 24 23   BUN 18 16 14   CREATININE 2.10* 1.78* 2.04*  CALCIUM 9.2 8.6* 8.8*  PROT 6.1*  --   --   BILITOT 0.6  --   --   ALKPHOS 75  --   --   ALT 12  --   --   AST 16  --   --   GLUCOSE 145* 78 142*   Imaging/Diagnostic Tests:  Pelvic US Complete: 05/02/2019 IMPRESSION: 1. Technically limited sonographic evaluation of the pelvis. Structure in the expected location of the uterus is abnormal for postmenopausal patient with heterogeneous hypoechoic and thickened endometrium. Recommend gynecologic consultation and possible further evaluation with MRI. Endometrial malignancy is considered. 2. Prominent left ovary for postmenopausal patient but no dominant cystic or solid mass defined by ultrasound.  Abdominal US Complete: 05/02/2019 IMPRESSION: 1. Diffusely heterogeneous liver parenchyma without well-defined mass. Suggestion of slight capsular nodularity. Recommend correlation for cirrhosis risk factors. 2. Gallstones without sonographic findings of acute cholecystitis. 3. Ascites and pleural effusions.  Kathrene Alu, MD 05/04/2019, 8:25 AM PGY-2, [place] Intern pager: [phone], text pages welcome

## 2019-05-05 ENCOUNTER — Inpatient Hospital Stay (HOSPITAL_COMMUNITY): Payer: Medicare Other

## 2019-05-05 DIAGNOSIS — R1900 Intra-abdominal and pelvic swelling, mass and lump, unspecified site: Secondary | ICD-10-CM

## 2019-05-05 LAB — HCV AB W REFLEX TO QUANT PCR: HCV Ab: <0.1 s/co ratio (ref 0.0–0.9)

## 2019-05-05 LAB — BASIC METABOLIC PANEL
Anion gap: 7 (ref 5–15)
BUN: 12 mg/dL (ref 8–23)
CO2: 22 mmol/L (ref 22–32)
Calcium: 7.9 mg/dL — ABNORMAL LOW (ref 8.9–10.3)
Chloride: 107 mmol/L (ref 98–111)
Creatinine, Ser: 1.74 mg/dL — ABNORMAL HIGH (ref 0.44–1.00)
GFR calc Af Amer: 31 mL/min — ABNORMAL LOW (ref >60)
GFR calc non Af Amer: 27 mL/min — ABNORMAL LOW (ref >60)
Glucose, Bld: 117 mg/dL — ABNORMAL HIGH (ref 70–99)
Potassium: 3.8 mmol/L (ref 3.5–5.1)
Sodium: 136 mmol/L (ref 135–145)

## 2019-05-05 LAB — CBC
HCT: 30.1 % — ABNORMAL LOW (ref 36.0–46.0)
Hemoglobin: 9.5 g/dL — ABNORMAL LOW (ref 12.0–15.0)
MCH: 30.2 pg (ref 26.0–34.0)
MCHC: 31.6 g/dL (ref 30.0–36.0)
MCV: 95.6 fL (ref 80.0–100.0)
Platelets: 303 10*3/uL (ref 150–400)
RBC: 3.15 MIL/uL — ABNORMAL LOW (ref 3.87–5.11)
RDW: 14 % (ref 11.5–15.5)
WBC: 8.1 10*3/uL (ref 4.0–10.5)
nRBC: 0 % (ref 0.0–0.2)

## 2019-05-05 LAB — HEPATITIS B CORE ANTIBODY, TOTAL: Hep B Core Total Ab: NEGATIVE

## 2019-05-05 LAB — GLUCOSE, CAPILLARY
Glucose-Capillary: 142 mg/dL — ABNORMAL HIGH (ref 70–99)
Glucose-Capillary: 129 mg/dL — ABNORMAL HIGH (ref 70–99)
Glucose-Capillary: 124 mg/dL — ABNORMAL HIGH (ref 70–99)
Glucose-Capillary: 107 mg/dL — ABNORMAL HIGH (ref 70–99)

## 2019-05-05 LAB — HCV INTERPRETATION

## 2019-05-05 LAB — HEPATITIS B SURFACE ANTIBODY, QUANTITATIVE: Hepatitis B-Post: <3.1 m[IU]/mL — ABNORMAL LOW (ref >9.9)

## 2019-05-05 LAB — HEPATITIS B SURFACE ANTIGEN: Hepatitis B Surface Ag: NEGATIVE

## 2019-05-05 LAB — HEPATITIS A ANTIBODY, TOTAL: hep A Total Ab: POSITIVE — AB

## 2019-05-05 MED ORDER — METOCLOPRAMIDE HCL 5 MG/ML IJ SOLN
10.0000 mg | Freq: Once | INTRAMUSCULAR | Status: AC
  Administered 2019-05-06: 10 mg via INTRAVENOUS
  Filled 2019-05-05: qty 2

## 2019-05-05 MED ORDER — INSULIN ASPART 100 UNIT/ML ~~LOC~~ SOLN
0.0000 [IU] | Freq: Four times a day (QID) | SUBCUTANEOUS | Status: DC
  Administered 2019-05-06: 12:00:00 2 [IU] via SUBCUTANEOUS
  Administered 2019-05-06: 18:00:00 5 [IU] via SUBCUTANEOUS
  Administered 2019-05-06 (×2): 3 [IU] via SUBCUTANEOUS
  Administered 2019-05-07 (×3): 2 [IU] via SUBCUTANEOUS
  Administered 2019-05-07: 18:00:00 3 [IU] via SUBCUTANEOUS

## 2019-05-05 MED ORDER — METOCLOPRAMIDE HCL 5 MG/ML IJ SOLN
10.0000 mg | Freq: Once | INTRAMUSCULAR | Status: AC
  Administered 2019-05-05: 16:00:00 10 mg via INTRAVENOUS
  Filled 2019-05-05: qty 2

## 2019-05-05 MED ORDER — BISACODYL 10 MG RE SUPP
10.0000 mg | Freq: Four times a day (QID) | RECTAL | Status: AC
  Administered 2019-05-05 (×2): 10 mg via RECTAL
  Filled 2019-05-05 (×2): qty 1

## 2019-05-05 MED ORDER — MORPHINE SULFATE (PF) 2 MG/ML IV SOLN
1.0000 mg | Freq: Once | INTRAVENOUS | Status: AC
  Administered 2019-05-05: 03:00:00 1 mg via INTRAVENOUS
  Filled 2019-05-05: qty 1

## 2019-05-05 MED ORDER — PEG-KCL-NACL-NASULF-NA ASC-C 100 G PO SOLR
0.5000 | Freq: Once | ORAL | Status: AC
  Administered 2019-05-06: 100 g via ORAL
  Filled 2019-05-05: qty 1

## 2019-05-05 MED ORDER — PEG-KCL-NACL-NASULF-NA ASC-C 100 G PO SOLR
0.5000 | Freq: Once | ORAL | Status: AC
  Administered 2019-05-05: 18:00:00 100 g via ORAL
  Filled 2019-05-05: qty 1

## 2019-05-05 MED ORDER — PEG-KCL-NACL-NASULF-NA ASC-C 100 G PO SOLR: 1.0000 | Freq: Once | ORAL | Status: DC

## 2019-05-05 NOTE — Progress Notes (Signed)
Called to pt room bc of complaints of RUQ pain and SOB.  pateint was satting 95% on room air and speaking full sentences without difficulty.  No increased WOB noted. Lungs were clear on exam, no basilar crackles.  Patient also complaining of 'sharp pain' in her RUQ.  Physical exam did not show tenderness to palpation.  RUQ could be due to the presumed hepatic malignancy seen on CT scan or 2/2 prolonged constipation, which patient is still endorsing.  SOB could be 2/2 pressure on her diaphragm from constipation, or from the pleural effusions seen on imaging earlier.  Patient did says she felt great today and might have 'overdid it'.    We then transitioned to discussing goals of care and being able to go home so she can talk to her kids before she makes these decisions and so she can get her affairs in order.  Expressed my hope that we can be able to send her home after the EGD/colonoscopy, but that we will have to see what the results show [place].  Answered [name]'s other questions regarding her current prognosis and she expressed her thanks for how kind everyone has been treating her while she is here.    For her pain, pt has q4h norco PRN and next dose was in 1.5 hours.  Gave her 1mg  IV morphine for pain d/t its faster onset.  Should still be able to get her PRN norco at scheduled time if she needs it.  Would consider an enema tomorrow to help relieve her constipation, as her current bowel regimen is not improving her constipation.

## 2019-05-05 NOTE — Progress Notes (Signed)
Family Medicine Teaching Service Daily Progress Note Intern Pager: [phone]  Patient name: Mary Harding Medical record number: [id number] Date of birth: 1934-06-13 Age: 85 y.o. Gender: female  Primary Care Provider: Deland Pretty, MD Consultants: GI, gynecological oncology, SLP Code Status: Full  Pt Overview and Major Events to Date:  5/14: Patient admitted to [facility] after paracentesis completed in ED 5/15: Seen by gynecological oncology, GI, Abdominal and pelvic u/s 5/16: Echo: EF 55-60% 5/19: EGD and Colonoscopy: reflux esophagitis with non-obstructing distal esophageal stricture, esophagel reflux, and bilious gastric fluids, non-bleeding external hemorrhoids, severe diverticulosis in sigmoid/descending colon (no diverticular bleed).  Assessment and Plan: Mary Harding a 85 y.o.femalepresenting with consipation, nausea an dintermittint vomiting. PMH is significant forCAD, HTN, HLD, T2DM, CKD IV, anxiety/depression, arthritis, OSA, peripheral neuropathy, and PAD.  Dysphagia:acute, worsening One month history of dysphagia to both solids and liquids with daily vomitus of food eaten. Received bowel prep via NGT overnight for EGD/colonoscpy today. Had one episode of dark vomitus this AM with reflux. EGD limited given anesthesia and notable for reflux esophagitis with non-obstructing distal esophageal stricture, esophagel reflux, and bilious gastric fluids. - SLP following - GI recs:   - UGI series  - Protonix 40 BID x 8 weeks,  - Carafate 1g QID x 2 weeks  - f/u EGD with dilation after 8 wks of PPI (reglan prior to facilitate gastric emptying)   - monitor for aspiration - CXR if pulmonary sxs - clear liquid diet - SCD's in place for VTE prophylaxis   Constipation,Narrow Stools, Melena: acute, stable  Received bowel prep overnight in preparation for colonoscopy today.  Colonoscopy limited as well due to anesthesia. Only able to visualize up to 50cm into colon. Was notable for  non-bleeding external hemorrhoids, severe diverticulosis in sigmoid/descending colon (no diverticular bleed). - GI recs: MRI abdomen when kidney function tolerates, high fiber diet - Miralax BID, Senokot-S BID - vitals per floor - SCD's in place for VTE prophylaxis   Abdominal ascites  liver nodules  pelvic masses: Acute, stable CT with nodular liver, LFTs/PLt WNL. Likely 2/2 metastatic disease. S/p paracentesis with removal of 3 L on 5/14. Peritoneal fluid culture with no growth x 4 days. Cytology pending. Hepatitis panel negative except Hep A Ab positive and HBsAb non immune. However, doubt this would cause of ascites and liver nodules. Cr 1.7>1.99 today. - GI recs: MRI abdomen once Cr 1.2-1.4 - Plan to do MRI once Cr improves - Gynecological oncology following, appreciate recommendations - Gastroenterology following, appreciate recommendations - Follow-up paracentesis findings - Hold Lovenox for procedure, SCD's in place for VTE prophylaxis   Hyponatremia: improved Na 136>141 - sSSI and Q4H CBG - continue to monitor   Bibasilar Lung Opacities: Having worsening SOB, now more at rest. Some concern for worsening pulmonary effusion 2/2 to malignancy. Unlikely infectious given afebrile and lack of infectious symptoms. Unlikely HR given Echo with EF 55-60%. Wt inc from 77.4kg on admission to 81.9kg. Likely will benefit from some diuresis. - follow up 2-view CXR with decubitus views - give Lasix 20mg  x 1  - monitor I/O's, daily weight - continue to monitor - oxygen as needed   Normocytic Anemia  FOBT+: stable Hgb 10.9>9.5>10.3 (Baseline 10).Home meds: Iron 325mg .  - Iron 325mg  - Daily CBC  - transfusion threshold <7  AKI on CKD IV: worsening Cr 2.10 on admission (baseline 1.0 in 2015). Cr this AM 1.74>1.99.  - Trend BMP / urinary output - Replace electrolytes as indicated - Avoid  nephrotoxic agents, ensure adequate renal perfusion  Enlarged Ovary  Thickened Endometrial  Stripe: Enlarged left ovary measuring 3 x 4.1 cmanddiffuse thickening of the endometrial stripe on CT abd/pelvis. CA-125 elevated. Likely represents ovarian cancer. Pelvic ultrasound shows heterogeneous, hypoechoic and thickened endometrium with recommend for further evaluation by MRI.  Prominent left ovary. - Given unremarkable GI workup, will re-consult Gyn/Onc for further evaluation - consulted gyn onc, appreciate recs: follow up after GI workup - Per Gyn, if gyn primary - best tx neoadjuvant chemotherapy +/- interval debulking if good clinical reponse  H/o CAD  PAD Renal Artery Stenosis: stable Home meds: Plavix 75mg and Coreg 12.5mg BID. - Continue to hold plavix - SCDs for VTE prophylaxes  - continue home Coreg  HTN: BP this AM 145/77. Home meds: Coreg 12.5mg  BID. Denies any headaches or vision changes. - continue home meds - continue to monitor  HLD: Lipid panel WNL. Home meds: Crestor 10mg  QD - continue home meds  T2DM: A1c 9.5. Home meds: Liragluitide 1.8mg  QD, Lantus 24U QD, NovologSliding scaleTID. CBGs 110-266 ON - CBG q4 - sSSI  GERD:  Home med: Protonix 40mg  QD. Reflux esophagitis and esophageal reflux notable on EGD.  - Per GI, increase Protonix 40mg  BID x 8 weeks - Begin Carafate 1g QID x 2 weeks  Chronic back pain: stable Continues to have improved pain control with Norco. - Hold home tramadol given no improvement - Continue Norco 5-325 q4 PRN - Lidocaine patch and K-pad  Anxiety/Depression: Home meds: Wellbutrin 150mg  QD. - continue home meds  FEN/GI:Clear liquid diet Prophylaxis: SCDs  Disposition: Pending medical work-up  Subjective:  Patient feeling well this morning.  Admits to feeling nauseous and had one episode of vomiting that was dark black.  She notes bowel prep overnight was measurable.  She also notes that she is becoming more short of breath at rest.  Notes this may be because she is a little scared for the procedure.   Denies any chest pain.  Has had many bowel movements overnight.   Objective: Temp:  [97.7 F (36.5 C)-98.4 F (36.9 C)] 97.9 F (36.6 C) (05/19 1043) Pulse Rate:  [79-111] 111 (05/19 1217) Resp:  [17-21] 20 (05/19 1217) BP: (144-162)/(57-77) 151/71 (05/19 1217) SpO2:  [96 %-100 %] 100 % (05/19 1217) Weight:  [81.6 kg-81.9 kg] 81.6 kg (05/19 0906) Physical Exam: General: pleasant elderly lady, appears unwell, sitting up at side of bed HEENT: normocephalic, atraumatic, moist mucous membranes Neck: supple, normal ROM CV: regular rate and rhythm without murmurs, rubs, or gallops, no lower extremity edema Lungs: clear to auscultation bilaterally with normal work of breathing, a little tachypniec on exam Abdomen: soft, non-tender, non-distended, normoactive bowel sounds Skin: warm, dry Extremities: warm and well perfused  Laboratory: Recent Labs  Lab 05/03/19 0548 05/05/19 0233 05/06/19 0234  WBC 9.4 8.1 10.7*  HGB 10.9* 9.5* 10.3*  HCT 34.8* 30.1* 33.5*  PLT 324 303 377   Recent Labs  Lab 05/01/19 1714  05/04/19 0856 05/05/19 0233 05/06/19 0234  NA 137   < > 130* 136 141  K 3.6   < > 4.1 3.8 3.9  CL 100   < > 100 107 111  CO2 25   < > 19* 22 17*  BUN 18   < > 14 12 10   CREATININE 2.10*   < > 2.09* 1.74* 1.99*  CALCIUM 9.2   < > 8.1* 7.9* 8.3*  PROT 6.1*  --   --   --   --  BILITOT 0.6  --   --   --   --   ALKPHOS 75  --   --   --   --   ALT 12  --   --   --   --   AST 16  --   --   --   --   GLUCOSE 145*   < > 270* 117* 244*   < > = values in this interval not displayed.   Trop 0.03>0.04>0.03 BNP 47.2 LA 1.1 CA-125: 594 COVID: neg FOBT: positive Acuities gram Stain: WBC, no organisms Ascites glucose: 151 Ascites cell count: WNL Ascites cytology: pending Ascites culture: NG x 3 days Ascites Albumin: 2.1 Hgb A1C: 9.5 Lipid panel: Chol 94, Trig 121, HDL 32, LDL 38 Iron 24, TIBC 172 (L), Ferritin 185 HCV AB: <0.1 CEA: 1.7 HBsAg: negative HBcAb:  negative HBsAb: <3.1 (non-immune) Hep A Ab: positive  Urinalysis    Component Value Date/Time   COLORURINE YELLOW 05/01/2019 2005   APPEARANCEUR CLEAR 05/01/2019 2005   LABSPEC 1.009 05/01/2019 2005   PHURINE 6.0 05/01/2019 2005   GLUCOSEU NEGATIVE 05/01/2019 2005   HGBUR SMALL (A) 05/01/2019 2005   [place] NEGATIVE 05/01/2019 2005   [place] 05/01/2019 2005   PROTEINUR 30 (A) 05/01/2019 2005   NITRITE NEGATIVE 05/01/2019 2005   LEUKOCYTESUR TRACE (A) 05/01/2019 2005   Imaging/Diagnostic Tests: Pelvic US Complete: 05/02/2019 IMPRESSION: 1. Technically limited sonographic evaluation of the pelvis. Structure in the expected location of the uterus is abnormal for postmenopausal patient with heterogeneous hypoechoic and thickened endometrium. Recommend gynecologic consultation and possible further evaluation with MRI. Endometrial malignancy is considered. 2. Prominent left ovary for postmenopausal patient but no dominant cystic or solid mass defined by ultrasound.  Abdominal US Complete: 05/02/2019 IMPRESSION: 1. Diffusely heterogeneous liver parenchyma without well-defined mass. Suggestion of slight capsular nodularity. Recommend correlation for cirrhosis risk factors. 2. Gallstones without sonographic findings of acute cholecystitis. 3. Ascites and pleural effusions.  Danna Hefty, DO 05/06/2019, 1:10 PM PGY-1, [place] Intern pager: [phone], text pages welcome

## 2019-05-05 NOTE — Progress Notes (Signed)
NG tube placed in patient and confirmed that positioning was correct with a order that it was appropriate to use NG tube. When I went to infuse to the moviprep, the water began to come out of the NG tube. Second, RN verified that NG tube was working properly. On call MD notified at [facility]. Lyndel Safe, MD stated to give 100 cc of prep per hour. If patient unable to tolerate prep, MD stated to call back. Night RN updated of this information.

## 2019-05-05 NOTE — Progress Notes (Signed)
Daily Rounding Note  05/05/2019, 9:34 AM  LOS: 4 days   SUBJECTIVE:   Chief complaint: Dysphagia, anemia.  History of Barrett's esophagus.  Abnormal esophageal and gastric imaging on CT.Marland Kitchen    Sharp brief bursts of pain in right upper quadrant for about 10 minutes last night.  No nausea.  Liquid and solid dysphasia persist. Large, brown stool last night.   OBJECTIVE:         Vital signs in last 24 hours:    Temp:  [97.7 F (36.5 C)-98.1 F (36.7 C)] 97.7 F (36.5 C) (05/18 0519) Pulse Rate:  [67-79] 79 (05/18 0837) Resp:  [18] 18 (05/18 0519) BP: (122-151)/(54-67) 151/67 (05/18 0837) SpO2:  [95 %] 95 % (05/18 0519) Weight:  [77.4 kg] 77.4 kg (05/18 0600) Last BM Date: 05/03/19 Filed Weights   05/03/19 0522 05/04/19 0559 05/05/19 0600  Weight: 76.4 kg 76.2 kg 77.4 kg   General: Pleasant, looks well.  Comfortable. Heart: RRR. Chest: Clear bilaterally.  No labored breathing or cough. Abdomen: Soft without tenderness.  No HSM, masses, bruits, hernias.  Bowel sounds active. Extremities: No CCE. Neuro/Psych: Fully alert and oriented.  No gross deficits, tremors, weakness.  Intake/Output from previous day: 05/17 0701 - 05/18 0700 In: 2919.8 [P.O.:820; I.V.:1099.8; IV Piggyback:1000] Out: -   Intake/Output this shift: No intake/output data recorded.  Lab Results: Recent Labs    05/03/19 0548 05/05/19 0233  WBC 9.4 8.1  HGB 10.9* 9.5*  HCT 34.8* 30.1*  PLT 324 303   BMET Recent Labs    05/03/19 0548 05/04/19 0856 05/05/19 0233  NA 136 130* 136  K 3.9 4.1 3.8  CL 100 100 107  CO2 23 19* 22  GLUCOSE 142* 270* 117*  BUN 14 14 12   CREATININE 2.04* 2.09* 1.74*  CALCIUM 8.8* 8.1* 7.9*   Scheduled Meds: . buPROPion  150 mg Oral Daily  . carvedilol  12.5 mg Oral BID WC  . insulin aspart  0-9 Units Subcutaneous Q4H  . lidocaine  1 patch Transdermal Q24H  . pantoprazole  40 mg Oral Daily  . polyethylene  glycol  17 g Oral BID  . rosuvastatin  10 mg Oral Daily  . senna-docusate  1 tablet Oral BID   Continuous Infusions: . sodium chloride 125 mL/hr at 05/05/19 0226   PRN Meds:.alum & mag hydroxide-simeth, [COMPLETED] Glycerin (Adult) **FOLLOWED BY** Glycerin (Adult), HYDROcodone-acetaminophen, ondansetron (ZOFRAN) IV  ASSESMENT:   *    Dysphagia.  History of Barrett's esophagus, compliant with daily PPI.  Distal esophageal dilatation, retained fluid, gastric wall thickening on CT.  *    Nodular liver per CT.  ?  Cirrhosis.    *   Ascites.  3 L paracentesis 5/14.  530 WBCs, 26% PMNs.   Fluid albumin 2.1, serum Albumin 2.8.  SAAG 0.7 which is more c/w carcinoma than liver dz.    *    Normocytic anemia.  *   Chronic Plavix for remote PCI.  On hold.  Last dose a.m. 5/15  *    Left ovarian enlargement and thickened endometrial stripe.  Evaluated, followed by GYN.  *    Right upper quadrant/epigastric pain for 10 minutes last night.  Troponin I's unremarkable.   PLAN   *   Plan colonoscopy on 5/19. Orders in to place NGT to infuse prep and bypass dysphagia.    *    Continue holding Plavix.    [name]  05/05/2019,  9:34 AM Phone [phone]

## 2019-05-05 NOTE — Plan of Care (Signed)
  Problem: Education: Goal: Knowledge of General Education information will improve Description: Including pain rating scale, medication(s)/side effects and non-pharmacologic comfort measures Outcome: Progressing   Problem: Activity: Goal: Risk for activity intolerance will decrease Outcome: Progressing   Problem: Pain Managment: Goal: General experience of comfort will improve Outcome: Progressing   

## 2019-05-05 NOTE — TOC Initial Note (Signed)
Transition of Care [facility]) - Initial/Assessment Note    Patient Details  Name: Mary Harding MRN: [id number] Date of Birth: [birth year]  Transition of Care [facility]) CM/SW Contact:    Sharin Mons, RN Phone Number: 05/05/2019, 10:00 AM  Clinical Narrative:         Presented with constipation , N/V, noted liver lesion. From home alone. PTA independent with ADL's. Owns walker , cane. States has supportive family, total 6 adult children, 2 lives in [place] ( son/daughter).        -- 5/14 s/p paracentesis           -- 5/19 plan:EGD/colonscopy on 5/19 ( awaiting Plavix washout)  - Gynecological oncology ,Gastroenterology following ...  PCP: Deland Pretty  NCM following for TOC needs ....  Expected Discharge Plan: Home/Self Care Barriers to Discharge: Continued Medical Work up   Patient Goals and CMS Choice Patient states their goals for this hospitalization and ongoing recovery are:: to get better and go home CMS Medicare.gov Compare Post Acute Care list provided to:: Patient    Expected Discharge Plan and Services Expected Discharge Plan: Home/Self Care   Discharge Planning Services: CM Consult   Living arrangements for the past 2 months: Single Family Home                 DME Arranged: (owns walker and cane)                    Prior Living Arrangements/Services Living arrangements for the past 2 months: Single Family Home Lives with:: Self Patient language and need for interpreter reviewed:: Yes Do you feel safe going back to the place where you live?: Yes      Need for Family Participation in Patient Care: No (Comment) Care giver support system in place?: Yes (comment)   Criminal Activity/Legal Involvement Pertinent to Current Situation/Hospitalization: No - Comment as needed  Activities of Daily Living Home Assistive Devices/Equipment: CBG Meter, Cane (specify quad or straight) ADL Screening (condition at time of admission) Patient's cognitive ability adequate  to safely complete daily activities?: Yes Is the patient deaf or have difficulty hearing?: No Does the patient have difficulty seeing, even when wearing glasses/contacts?: No Does the patient have difficulty concentrating, remembering, or making decisions?: No Patient able to express need for assistance with ADLs?: Yes Does the patient have difficulty dressing or bathing?: No Independently performs ADLs?: Yes (appropriate for developmental age) Does the patient have difficulty walking or climbing stairs?: Yes Weakness of Legs: Both Weakness of Arms/Hands: None  Permission Sought/Granted Permission sought to share information with : Case Manager, Family Supports Permission granted to share information with : Yes, Verbal Permission Granted  Share Information with NAME: Mikey Bussing (Daughter)Michelle Gilbert (Daughter)           Emotional Assessment Appearance:: Appears stated age Attitude/Demeanor/Rapport: Engaged, Gracious Affect (typically observed): Accepting Orientation: : Oriented to Self, Oriented to  Time, Oriented to Situation, Oriented to Place Alcohol / Substance Use: Not Applicable Psych Involvement: No (comment)  Admission diagnosis:  Other ascites [R18.8] Large ovary [N83.8] Acute renal failure superimposed on chronic kidney disease, unspecified CKD stage, unspecified acute renal failure type ([place]) [N17.9, N18.9] Patient Active Problem List   Diagnosis Date Noted  . Narrowing of stools   . Ascites 05/02/2019  . Pelvic mass 05/02/2019  . Severe malnutrition ([place]) 05/02/2019  . Acute renal failure superimposed on chronic kidney disease ([place])   . Large ovary   . Liver lesion 05/01/2019  .  Pseudoaneurysm following procedure ([place]) 10/25/2018  . PAD (peripheral artery disease) ([place]) 10/01/2018  . Claudication in peripheral vascular disease ([place]) 09/30/2018  . Uncontrolled type 2 diabetes mellitus with hyperglycemia ([place]) 04/08/2018  . Hyperlipidemia 01/18/2017  .  Urinary tract infection 01/18/2017  . Type II or unspecified type diabetes mellitus with renal manifestations, not stated as uncontrolled(250.40) 04/10/2014  . Type II or unspecified type diabetes mellitus without mention of complication, uncontrolled 04/10/2014  . Coronary atherosclerosis of native coronary artery 04/10/2014  . Unstable angina pectoris ([place]) 04/08/2014   PCP:  Deland Pretty, MD Pharmacy:   [facility] [place], [place] [address] [place] [address] Phone: [phone] Fax: [phone]     Social Determinants of Health (SDOH) Interventions    Readmission Risk Interventions No flowsheet data found.

## 2019-05-05 NOTE — Progress Notes (Signed)
FPTS Interim Progress Note  Called to update [name]'s son Mary Harding at [phone] and daughter Mary Harding at [phone]. Informed them of her planned EGD/Colonscopy scheduled for tomorrow morning (~9:30am). Informed them she will be having an NGT placed for her bowel prep given her dysphagia. They understood and appreciated the update. We will continue to update them daily.  Danna Hefty, DO 05/05/2019, 11:58 AM PGY-1, [place] Medicine Service pager [phone]

## 2019-05-05 NOTE — H&P (View-Only) (Signed)
Daily Rounding Note  05/05/2019, 9:34 AM  LOS: 4 days   SUBJECTIVE:   Chief complaint: Dysphagia, anemia.  History of Barrett's esophagus.  Abnormal esophageal and gastric imaging on CT.Mary Harding    Sharp brief bursts of pain in right upper quadrant for about 10 minutes last night.  No nausea.  Liquid and solid dysphasia persist. Large, brown stool last night.   OBJECTIVE:         Vital signs in last 24 hours:    Temp:  [97.7 F (36.5 C)-98.1 F (36.7 C)] 97.7 F (36.5 C) (05/18 0519) Pulse Rate:  [67-79] 79 (05/18 0837) Resp:  [18] 18 (05/18 0519) BP: (122-151)/(54-67) 151/67 (05/18 0837) SpO2:  [95 %] 95 % (05/18 0519) Weight:  [77.4 kg] 77.4 kg (05/18 0600) Last BM Date: 05/03/19 Filed Weights   05/03/19 0522 05/04/19 0559 05/05/19 0600  Weight: 76.4 kg 76.2 kg 77.4 kg   General: Pleasant, looks well.  Comfortable. Heart: RRR. Chest: Clear bilaterally.  No labored breathing or cough. Abdomen: Soft without tenderness.  No HSM, masses, bruits, hernias.  Bowel sounds active. Extremities: No CCE. Neuro/Psych: Fully alert and oriented.  No gross deficits, tremors, weakness.  Intake/Output from previous day: 05/17 0701 - 05/18 0700 In: 2919.8 [P.O.:820; I.V.:1099.8; IV Piggyback:1000] Out: -   Intake/Output this shift: No intake/output data recorded.  Lab Results: Recent Labs    05/03/19 0548 05/05/19 0233  WBC 9.4 8.1  HGB 10.9* 9.5*  HCT 34.8* 30.1*  PLT 324 303   BMET Recent Labs    05/03/19 0548 05/04/19 0856 05/05/19 0233  NA 136 130* 136  K 3.9 4.1 3.8  CL 100 100 107  CO2 23 19* 22  GLUCOSE 142* 270* 117*  BUN 14 14 12   CREATININE 2.04* 2.09* 1.74*  CALCIUM 8.8* 8.1* 7.9*   Scheduled Meds: . buPROPion  150 mg Oral Daily  . carvedilol  12.5 mg Oral BID WC  . insulin aspart  0-9 Units Subcutaneous Q4H  . lidocaine  1 patch Transdermal Q24H  . pantoprazole  40 mg Oral Daily  . polyethylene  glycol  17 g Oral BID  . rosuvastatin  10 mg Oral Daily  . senna-docusate  1 tablet Oral BID   Continuous Infusions: . sodium chloride 125 mL/hr at 05/05/19 0226   PRN Meds:.alum & mag hydroxide-simeth, [COMPLETED] Glycerin (Adult) **FOLLOWED BY** Glycerin (Adult), HYDROcodone-acetaminophen, ondansetron (ZOFRAN) IV  ASSESMENT:   *    Dysphagia.  History of Barrett's esophagus, compliant with daily PPI.  Distal esophageal dilatation, retained fluid, gastric wall thickening on CT.  *    Nodular liver per CT.  ?  Cirrhosis.    *   Ascites.  3 L paracentesis 5/14.  530 WBCs, 26% PMNs.   Fluid albumin 2.1, serum Albumin 2.8.  SAAG 0.7 which is more c/w carcinoma than liver dz.    *    Normocytic anemia.  *   Chronic Plavix for remote PCI.  On hold.  Last dose a.m. 5/15  *    Left ovarian enlargement and thickened endometrial stripe.  Evaluated, followed by GYN.  *    Right upper quadrant/epigastric pain for 10 minutes last night.  Troponin I's unremarkable.   PLAN   *   Plan colonoscopy on 5/19. Orders in to place NGT to infuse prep and bypass dysphagia.    *    Continue holding Plavix.    Mary Harding  05/05/2019,  9:34 AM Phone [phone]

## 2019-05-06 ENCOUNTER — Encounter (HOSPITAL_COMMUNITY): Payer: Self-pay | Admitting: Certified Registered Nurse Anesthetist

## 2019-05-06 ENCOUNTER — Inpatient Hospital Stay (HOSPITAL_COMMUNITY): Payer: Medicare Other | Admitting: Certified Registered Nurse Anesthetist

## 2019-05-06 ENCOUNTER — Encounter (HOSPITAL_COMMUNITY)
Admission: EM | Disposition: A | Discharge status: Hospice | Payer: Self-pay | Source: Home / Self Care | Attending: Family Medicine

## 2019-05-06 ENCOUNTER — Inpatient Hospital Stay (HOSPITAL_COMMUNITY): Payer: Medicare Other

## 2019-05-06 DIAGNOSIS — R194 Change in bowel habit: Secondary | ICD-10-CM

## 2019-05-06 DIAGNOSIS — K210 Gastro-esophageal reflux disease with esophagitis: Principal | ICD-10-CM

## 2019-05-06 DIAGNOSIS — R1310 Dysphagia, unspecified: Secondary | ICD-10-CM

## 2019-05-06 DIAGNOSIS — C569 Malignant neoplasm of unspecified ovary: Secondary | ICD-10-CM

## 2019-05-06 HISTORY — PX: COLONOSCOPY: SHX5424

## 2019-05-06 HISTORY — PX: ESOPHAGOGASTRODUODENOSCOPY: SHX5428

## 2019-05-06 LAB — CBC
HCT: 33.5 % — ABNORMAL LOW (ref 36.0–46.0)
Hemoglobin: 10.3 g/dL — ABNORMAL LOW (ref 12.0–15.0)
MCH: 30 pg (ref 26.0–34.0)
MCHC: 30.7 g/dL (ref 30.0–36.0)
MCV: 97.7 fL (ref 80.0–100.0)
Platelets: 377 10*3/uL (ref 150–400)
RBC: 3.43 MIL/uL — ABNORMAL LOW (ref 3.87–5.11)
RDW: 14.3 % (ref 11.5–15.5)
WBC: 10.7 10*3/uL — ABNORMAL HIGH (ref 4.0–10.5)
nRBC: 0 % (ref 0.0–0.2)

## 2019-05-06 LAB — CBC WITH DIFFERENTIAL/PLATELET
Abs Immature Granulocytes: 0.15 10*3/uL — ABNORMAL HIGH (ref 0.00–0.07)
Basophils Absolute: 0.1 10*3/uL (ref 0.0–0.1)
Basophils Relative: 0 %
Eosinophils Absolute: 0.2 10*3/uL (ref 0.0–0.5)
Eosinophils Relative: 1 %
HCT: 32.5 % — ABNORMAL LOW (ref 36.0–46.0)
Hemoglobin: 10.3 g/dL — ABNORMAL LOW (ref 12.0–15.0)
Immature Granulocytes: 1 %
Lymphocytes Relative: 7 %
Lymphs Abs: 1.7 10*3/uL (ref 0.7–4.0)
MCH: 30.8 pg (ref 26.0–34.0)
MCHC: 31.7 g/dL (ref 30.0–36.0)
MCV: 97.3 fL (ref 80.0–100.0)
Monocytes Absolute: 1.1 10*3/uL — ABNORMAL HIGH (ref 0.1–1.0)
Monocytes Relative: 5 %
Neutro Abs: 20.9 10*3/uL — ABNORMAL HIGH (ref 1.7–7.7)
Neutrophils Relative %: 86 %
Platelets: 412 10*3/uL — ABNORMAL HIGH (ref 150–400)
RBC: 3.34 MIL/uL — ABNORMAL LOW (ref 3.87–5.11)
RDW: 14.5 % (ref 11.5–15.5)
WBC: 24 10*3/uL — ABNORMAL HIGH (ref 4.0–10.5)
nRBC: 0 % (ref 0.0–0.2)

## 2019-05-06 LAB — PROTIME-INR
INR: 1.1 (ref 0.8–1.2)
Prothrombin Time: 14.3 seconds (ref 11.4–15.2)

## 2019-05-06 LAB — BASIC METABOLIC PANEL
Anion gap: 13 (ref 5–15)
BUN: 10 mg/dL (ref 8–23)
CO2: 17 mmol/L — ABNORMAL LOW (ref 22–32)
Calcium: 8.3 mg/dL — ABNORMAL LOW (ref 8.9–10.3)
Chloride: 111 mmol/L (ref 98–111)
Creatinine, Ser: 1.99 mg/dL — ABNORMAL HIGH (ref 0.44–1.00)
GFR calc Af Amer: 26 mL/min — ABNORMAL LOW (ref >60)
GFR calc non Af Amer: 23 mL/min — ABNORMAL LOW (ref >60)
Glucose, Bld: 244 mg/dL — ABNORMAL HIGH (ref 70–99)
Potassium: 3.9 mmol/L (ref 3.5–5.1)
Sodium: 141 mmol/L (ref 135–145)

## 2019-05-06 LAB — GLUCOSE, CAPILLARY
Glucose-Capillary: 200 mg/dL — ABNORMAL HIGH (ref 70–99)
Glucose-Capillary: 206 mg/dL — ABNORMAL HIGH (ref 70–99)
Glucose-Capillary: 238 mg/dL — ABNORMAL HIGH (ref 70–99)
Glucose-Capillary: 245 mg/dL — ABNORMAL HIGH (ref 70–99)
Glucose-Capillary: 263 mg/dL — ABNORMAL HIGH (ref 70–99)

## 2019-05-06 LAB — CULTURE, BODY FLUID W GRAM STAIN -BOTTLE: Culture: NO GROWTH

## 2019-05-06 SURGERY — EGD (ESOPHAGOGASTRODUODENOSCOPY)
Anesthesia: Monitor Anesthesia Care

## 2019-05-06 MED ORDER — FUROSEMIDE 10 MG/ML IJ SOLN
20.0000 mg | Freq: Once | INTRAMUSCULAR | Status: AC
  Administered 2019-05-06: 22:00:00 20 mg via INTRAVENOUS
  Filled 2019-05-06: qty 2

## 2019-05-06 MED ORDER — PHENYLEPHRINE 40 MCG/ML (10ML) SYRINGE FOR IV PUSH (FOR BLOOD PRESSURE SUPPORT)
PREFILLED_SYRINGE | INTRAVENOUS | Status: DC | PRN
  Administered 2019-05-06 (×2): 120 ug via INTRAVENOUS
  Administered 2019-05-06 (×2): 80 ug via INTRAVENOUS

## 2019-05-06 MED ORDER — PANTOPRAZOLE SODIUM 40 MG PO TBEC: 40.0000 mg | DELAYED_RELEASE_TABLET | Freq: Every day | ORAL | Status: DC

## 2019-05-06 MED ORDER — PROPOFOL 500 MG/50ML IV EMUL
INTRAVENOUS | Status: DC | PRN
  Administered 2019-05-06: 100 ug/kg/min via INTRAVENOUS

## 2019-05-06 MED ORDER — POTASSIUM CHLORIDE CRYS ER 10 MEQ PO TBCR
20.0000 meq | EXTENDED_RELEASE_TABLET | Freq: Every day | ORAL | Status: DC
  Administered 2019-05-06 – 2019-05-07 (×2): 20 meq via ORAL
  Filled 2019-05-06 (×2): qty 2

## 2019-05-06 MED ORDER — ADULT MULTIVITAMIN W/MINERALS CH
1.0000 | ORAL_TABLET | Freq: Every day | ORAL | Status: DC
  Administered 2019-05-06 – 2019-05-07 (×2): 1 via ORAL
  Filled 2019-05-06 (×2): qty 1

## 2019-05-06 MED ORDER — EPINEPHRINE 0.3 MG/0.3ML IJ SOAJ
0.3000 mg | Freq: Once | INTRAMUSCULAR | Status: DC | PRN
  Filled 2019-05-06: qty 0.3

## 2019-05-06 MED ORDER — INSULIN ASPART 100 UNIT/ML ~~LOC~~ SOLN: 3.0000 [IU] | Freq: Three times a day (TID) | SUBCUTANEOUS | Status: DC

## 2019-05-06 MED ORDER — IPRATROPIUM-ALBUTEROL 0.5-2.5 (3) MG/3ML IN SOLN
3.0000 mL | Freq: Once | RESPIRATORY_TRACT | Status: AC
  Administered 2019-05-06: 11:00:00 3 mL via RESPIRATORY_TRACT

## 2019-05-06 MED ORDER — SODIUM CHLORIDE 0.9 % IV SOLN: INTRAVENOUS | Status: DC

## 2019-05-06 MED ORDER — SENNOSIDES-DOCUSATE SODIUM 8.6-50 MG PO TABS: 1.0000 | ORAL_TABLET | Freq: Two times a day (BID) | ORAL | Status: DC

## 2019-05-06 MED ORDER — FAMOTIDINE 20 MG PO TABS
20.0000 mg | ORAL_TABLET | Freq: Once | ORAL | Status: AC
  Administered 2019-05-06: 14:00:00 20 mg via ORAL
  Filled 2019-05-06: qty 1

## 2019-05-06 MED ORDER — FUROSEMIDE 10 MG/ML IJ SOLN
20.0000 mg | Freq: Once | INTRAMUSCULAR | Status: AC
  Administered 2019-05-06: 20 mg via INTRAVENOUS
  Filled 2019-05-06: qty 2

## 2019-05-06 MED ORDER — SODIUM CHLORIDE 0.9 % IV SOLN
INTRAVENOUS | Status: DC | PRN
  Administered 2019-05-06: 09:00:00 via INTRAVENOUS

## 2019-05-06 MED ORDER — PANTOPRAZOLE SODIUM 40 MG PO TBEC
40.0000 mg | DELAYED_RELEASE_TABLET | Freq: Two times a day (BID) | ORAL | Status: DC
  Administered 2019-05-06 – 2019-05-08 (×4): 40 mg via ORAL
  Filled 2019-05-06 (×4): qty 1

## 2019-05-06 MED ORDER — SODIUM CHLORIDE 0.9 % IV SOLN
INTRAVENOUS | Status: DC
  Administered 2019-05-06: 500 mL via INTRAVENOUS

## 2019-05-06 MED ORDER — NITROGLYCERIN 0.4 MG SL SUBL: 0.4000 mg | SUBLINGUAL_TABLET | SUBLINGUAL | Status: DC | PRN

## 2019-05-06 MED ORDER — FERROUS SULFATE 325 (65 FE) MG PO TABS
325.0000 mg | ORAL_TABLET | Freq: Two times a day (BID) | ORAL | Status: DC
  Administered 2019-05-06 – 2019-05-07 (×2): 325 mg via ORAL
  Filled 2019-05-06 (×2): qty 1

## 2019-05-06 MED ORDER — SUCRALFATE 1 G PO TABS
1.0000 g | ORAL_TABLET | Freq: Three times a day (TID) | ORAL | Status: DC
  Administered 2019-05-06 – 2019-05-08 (×8): 1 g via ORAL
  Filled 2019-05-06 (×8): qty 1

## 2019-05-06 MED ORDER — IPRATROPIUM-ALBUTEROL 0.5-2.5 (3) MG/3ML IN SOLN
RESPIRATORY_TRACT | Status: AC
  Filled 2019-05-06: qty 3

## 2019-05-06 MED ORDER — LIRAGLUTIDE 18 MG/3ML ~~LOC~~ SOLN: 1.8000 mg | Freq: Every day | SUBCUTANEOUS | Status: DC

## 2019-05-06 NOTE — Anesthesia Preprocedure Evaluation (Signed)
Anesthesia Evaluation  Patient identified by MRN, date of birth, ID band Patient awake    Reviewed: Allergy & Precautions, NPO status , Patient's Chart, lab work & pertinent test results  History of Anesthesia Complications (+) MALIGNANT HYPERTHERMIA, Family history of anesthesia reaction and history of anesthetic complications  Airway Mallampati: II  TM Distance: >3 FB     Dental   Pulmonary sleep apnea , former smoker,    breath sounds clear to auscultation       Cardiovascular hypertension, Pt. on medications + CAD and + Peripheral Vascular Disease   Rhythm:Regular Rate:Normal     Neuro/Psych  Neuromuscular disease    GI/Hepatic Neg liver ROS, GERD  ,  Endo/Other  diabetes, Type 2  Renal/GU Renal disease     Musculoskeletal  (+) Arthritis ,   Abdominal   Peds  Hematology  (+) anemia ,   Anesthesia Other Findings   Reproductive/Obstetrics                             Lab Results  Component Value Date   WBC 10.7 (H) 05/06/2019   HGB 10.3 (L) 05/06/2019   HCT 33.5 (L) 05/06/2019   MCV 97.7 05/06/2019   PLT 377 05/06/2019   Lab Results  Component Value Date   CREATININE 1.99 (H) 05/06/2019   BUN 10 05/06/2019   NA 141 05/06/2019   K 3.9 05/06/2019   CL 111 05/06/2019   CO2 17 (L) 05/06/2019    Anesthesia Physical Anesthesia Plan  ASA: IV  Anesthesia Plan: MAC   Post-op Pain Management:    Induction: Intravenous  PONV Risk Score and Plan: 2 and Propofol infusion, Treatment may vary due to age or medical condition and Ondansetron  Airway Management Planned: Natural Airway and Nasal Cannula  Additional Equipment:   Intra-op Plan:   Post-operative Plan:   Informed Consent: I have reviewed the patients History and Physical, chart, labs and discussed the procedure including the risks, benefits and alternatives for the proposed anesthesia with the patient or authorized  representative who has indicated his/her understanding and acceptance.       Plan Discussed with:   Anesthesia Plan Comments:         Anesthesia Quick Evaluation

## 2019-05-06 NOTE — Progress Notes (Signed)
Patient in Endo recovery, having junky breath sounds more so on left side, stating hard to get breath although her oxygen holding out at high 90's. MD Ola Spurr (anesthesia) called and after exam he ordered 1 duoneb and asked if she could be on cont pulse ox on the floor. Spoke with her nurse on floor [place] and updated her on her progress and what to be looking out for, as well that if anything changed with her oxygen status to let primary team know.

## 2019-05-06 NOTE — Transfer of Care (Signed)
Immediate Anesthesia Transfer of Care Note  Patient: Mary Harding  Procedure(s) Performed: ESOPHAGOGASTRODUODENOSCOPY (EGD) (N/A ) COLONOSCOPY (N/A )  Patient Location: Endoscopy Unit  Anesthesia Type:MAC  Level of Consciousness: awake, alert  and oriented  Airway & Oxygen Therapy: Patient Spontanous Breathing and Patient connected to face mask oxygen  Post-op Assessment: Report given to RN, Post -op Vital signs reviewed and stable and Patient moving all extremities X 4  Post vital signs: Reviewed and stable  Last Vitals:  Vitals Value Taken Time  BP    Temp    Pulse 108 05/06/2019 10:44 AM  Resp 21 05/06/2019 10:44 AM  SpO2 99 % 05/06/2019 10:44 AM  Vitals shown include unvalidated device data.  Last Pain:  Vitals:   05/06/19 0906  TempSrc: Oral  PainSc: 0-No pain      Patients Stated Pain Goal: 0 (09/32/35 5732)  Complications: No apparent anesthesia complications

## 2019-05-06 NOTE — Plan of Care (Signed)
Patient returned from EGD and colonoscopy. Complaining of heartburn given mylanta and protonix as ordered. Also given hydrocodone for back pain. Patient encouraged to deep breath and cough to help keep airways open. Complaining of labored breathing, oxygen sat 99% on 2L oxygen.    Problem: Education: Goal: Knowledge of General Education information will improve Description Including pain rating scale, medication(s)/side effects and non-pharmacologic comfort measures Outcome: Progressing   Problem: Activity: Goal: Risk for activity intolerance will decrease Outcome: Progressing   Problem: Pain Managment: Goal: General experience of comfort will improve Outcome: Progressing

## 2019-05-06 NOTE — Anesthesia Postprocedure Evaluation (Signed)
Anesthesia Post Note  Patient: LADORA OSTERBERG  Procedure(s) Performed: ESOPHAGOGASTRODUODENOSCOPY (EGD) (N/A ) COLONOSCOPY (N/A )     Patient location during evaluation: PACU Anesthesia Type: MAC Level of consciousness: awake and alert Pain management: pain level controlled Vital Signs Assessment: post-procedure vital signs reviewed and stable Respiratory status: spontaneous breathing, nonlabored ventilation, respiratory function stable and patient connected to nasal cannula oxygen Cardiovascular status: stable and blood pressure returned to baseline Postop Assessment: no apparent nausea or vomiting Anesthetic complications: yes Anesthetic complication details: possible aspiration event with pt refluxing on placement of upper scope. Sats 97% on Maverick in recovery and breathing improved with albuterol tx. Pt to room with continuous pulse ox. and anesthesia complications   Last Vitals:  Vitals:   05/06/19 1050 05/06/19 1217  BP: (!) 162/57 (!) 151/71  Pulse: (!) 108 (!) 111  Resp: 20 20  Temp:    SpO2: 100% 100%    Last Pain:  Vitals:   05/06/19 1043  TempSrc: Axillary  PainSc: 4                  Tiajuana Amass

## 2019-05-06 NOTE — Progress Notes (Addendum)
Family Medicine Teaching Service Daily Progress Note Intern Pager: [phone]  Patient name: Mary Harding Medical record number: [id number] Date of birth: 1934/01/24 Age: 85 y.o. Gender: female  Primary Care Provider: Deland Pretty, MD Consultants: GI, gynecological oncology, SLP Code Status: Full  Pt Overview and Major Events to Date:  5/14: Patient admitted to [facility] after paracentesis completed in ED 5/15: Seen by gynecological oncology, GI, Abdominal and pelvic u/s 5/16: Echo: EF 55-60% 5/19: EGD and Colonoscopy, Gyn/onc reconsulted 5/20: V/Q scan  Assessment and Plan: MAHUM BETTEN a 85 y.o.femalepresenting with consipation, nausea an dintermittint vomiting. PMH is significant forCAD, HTN, HLD, T2DM, CKD IV, anxiety/depression, arthritis, OSA, peripheral neuropathy, and PAD.  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 Gyn/onc reconsulted yesterday. Patient unsure if she would like chemo or surgery. Patient is planning on telling family about this today. - Per gyn/onc: recommend neoadjuvant chemotherapy with a platinum doublet; will facilitate referral to see Dr. Heath Lark for evaluation  - palliative care consulted, will follow up   Bibasilar Lung Opacities: Having worsening SOB, now more at rest improved with 3L O2. Some concern for worsening pulmonary effusion 2/2 to malignancy. Repeat CXR with small-mod L>R pleural effusion with moderate layering on decubitus views with development of L>R ground-glass airspace opacity suspicious for PNA. Aspiration PNA possible given procedure yesterday. Unlikely HF given Echo with EF 55-60%. S/p Lasix 20mg  x 2 without much improvement. Wt stable at 81.6kg, UOP only 418mL ON.  New oxygen requirement and  tachycardia raises some concern for PE given acute worsening in respiratory status, malignancy, and lack of DVT prophylaxis and more bed bound. Wells Score 2.5 with 16% chance PE.  - follow up V/Q scan - Start Augmentin 500 mg BID x 7 days to cover for aspiration PNA  - can switch to suspicion if difficulty with swallowing - s/p Lasix 20mg  x 2, will stop - monitor I/O's, daily weight - continue to monitor respiratory status - oxygen as needed, wean as tolerated  Dysphagia:acute, worsening S/p EGD on 5/19. Exam limited due to anesthesia and gastric reflux. Overall results were negative for apparent malignancy, however found reflux esophagitis with non-obstructing distal esophageal stricture. Patient notes still very difficult to swallow. Has not eaten anything because of this.  - Per GI: UGI series, Protonix 40 BID x 8 weeks, Carafate 1g QID x 2 weeks, f/u EGD with dilation after 8 wks of PPI (reglan prior to facilitate gastric emptying - Continue to monitor Cr, will order UGI series if patient desires further workup when kidney function allows  - Follow up palliative consult and GOC  - Consider feeding tube if still unable to tolerate PO   Constipation,Narrow Stools, Melena: acute, stable S/p colonoscopy on 5/19. Exam limited due to hemodynamic instability with anesthesia. Only small portion of colon visualized. Notable for on-bleeding external hemorrhoids, severe diverticulosis in sigmoid/descending colon (no diverticular bleed).  - Per GI: recommend MRI abdomen when Cr 1.2-1.4 - MIralax BID, Senokot-S BID  Abdominal ascites  liver nodules  pelvic masses: Acute, stable - see above  Hyponatremia: resolved Na 136>141>140 - continue to monitor   Normocytic Anemia  FOBT+: stable Hgb 9.5>10.3>9.8 (Baseline 10).Home meds: Iron 325mg .  - Iron 325mg  - Daily CBC  - transfusion threshold <7  AKI on CKD IV: worsening Cr 2.10 on admission (baseline  1.0 in 2015). Cr this AM  1.74>1.99>2.59.  - Trend BMP / urinary output - Replace electrolytes as indicated - Avoid nephrotoxic agents, ensure adequate renal perfusion  H/o CAD  PAD Renal Artery Stenosis: stable Home meds: Plavix 75mg and Coreg 12.5mg BID. - Continue to hold plavix - SCDs for VTE prophylaxes  - continue home Coreg  HTN: BP this AM 145/77. Home meds: Coreg 12.5mg  BID. Denies any headaches or vision changes. - continue home meds - continue to monitor  HLD: Lipid panel WNL. Home meds: Crestor 10mg  QD - continue home meds  T2DM: A1c 9.5. Home meds: Liragluitide 1.8mg  QD, Lantus 24U QD, NovologSliding scaleTID. CBGs 180-250 ON - CBG q4 - sSSI  GERD:  Home med: Protonix 40mg  QD. Reflux esophagitis and esophageal reflux notable on EGD.  - Continue Protonix 40mg  BID x 8 weeks - Continue Carafate 1g QID x 2 weeks  Chronic back pain: stable Continues to have improved pain control with Norco. - Hold home tramadol given no improvement, plan to discontinue at discharge - Continue Norco 5-325 q4 PRN - Lidocaine patch and K-pad  Anxiety/Depression: Home meds: Wellbutrin 150mg  QD. - continue home meds  FEN/GI:Clear liquid diet Prophylaxis: SCDs  Disposition: Pending medical work-up  Subjective:  Patient very sleepy this morning, but notes she feels better than she has in months. Just really sleepy this morning. Answered all questions and able to maintain converstatin although kept eyes clothes throughout visit. Denies any further nausea or vomiting but has not eaten anything due to difficulty. Denies any further diarrhea. Notes improvement in SOB with oxygen. Denies any CP or abdominal pain.   Objective: Temp:  [97.7 F (36.5 C)-98.7 F (37.1 C)] 98.7 F (37.1 C) (05/20 0519) Pulse Rate:  [88-111] 92 (05/20 0834) Resp:  [16-22] 16 (05/20 0519) BP: (113-168)/(45-77) 152/65 (05/20 0834) SpO2:  [98 %-100 %] 100 % (05/20 0519) Weight:  [81.6 kg] 81.6 kg (05/20  0515) Physical Exam: General: very sleepy on exam, lying comfortably in bed with nasal canula in place, doesn't appear as well as days prior  HEENT: normocephalic, atraumatic, moist mucous membranes CV: regular rate and rhythm without murmurs, rubs, or gallops, no lower extremity edema, 2+ radial and pedal pulses bilaterally  Lungs: crackles appreciated on LLL, with normal work of breathing with 3L O2 Abdomen: soft, non-tender, non-distended, normoactive bowel sounds Extremities: warm and well perfused Neuro: Sleepy and kept eyes closed throughout exam, but answered all questions appropriately, able to maintain linear conversation  Laboratory: Recent Labs  Lab 05/06/19 0234 05/06/19 2123 05/07/19 0342  WBC 10.7* 24.0* 18.8*  HGB 10.3* 10.3* 9.8*  HCT 33.5* 32.5* 31.1*  PLT 377 412* 400   Recent Labs  Lab 05/01/19 1714  05/05/19 0233 05/06/19 0234 05/07/19 0342  NA 137   < > 136 141 140  K 3.6   < > 3.8 3.9 4.4  CL 100   < > 107 111 109  CO2 25   < > 22 17* 19*  BUN 18   < > 12 10 15   CREATININE 2.10*   < > 1.74* 1.99* 2.59*  CALCIUM 9.2   < > 7.9* 8.3* 8.7*  PROT 6.1*  --   --   --   --   BILITOT 0.6  --   --   --   --   ALKPHOS 75  --   --   --   --   ALT 12  --   --   --   --  AST 16  --   --   --   --   GLUCOSE 145*   < > 117* 244* 182*   < > = values in this interval not displayed.   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 Technically limited sonographic evaluation of the pelvis. Structure in the expected location of the uterus is abnormal for postmenopausal patient with heterogeneous hypoechoic and thickened endometrium. Recommend gynecologic consultation and possible further evaluation with MRI. Endometrial malignancy is considered. 2. Prominent left ovary for postmenopausal patient but no dominant cystic or solid mass defined by ultrasound.  Abdominal US Complete: 05/02/2019 IMPRESSION: 1. Diffusely heterogeneous liver parenchyma without well-defined mass. Suggestion of slight capsular nodularity. Recommend correlation for cirrhosis risk factors. 2. Gallstones without sonographic findings of acute cholecystitis. 3. Ascites and pleural effusions.  Mina Marble [place], DO 05/07/2019, 9:54 AM PGY-1, [place] Intern pager: [phone], text pages welcome

## 2019-05-06 NOTE — Progress Notes (Signed)
Pt was able to get all of her prep and stool are now a clear yellowish- Ernan Runkles color. NG tube removed at this time.

## 2019-05-06 NOTE — Anesthesia Procedure Notes (Signed)
Procedure Name: MAC Date/Time: 05/06/2019 9:55 AM Performed by: Harden Mo, CRNA Pre-anesthesia Checklist: Patient identified, Emergency Drugs available, Suction available and Patient being monitored Patient Re-evaluated:Patient Re-evaluated prior to induction Oxygen Delivery Method: Nasal cannula Preoxygenation: Pre-oxygenation with 100% oxygen Induction Type: IV induction Placement Confirmation: positive ETCO2 and breath sounds checked- equal and bilateral Dental Injury: Teeth and Oropharynx as per pre-operative assessment

## 2019-05-06 NOTE — Op Note (Signed)
[facility] Patient Name: Mary Harding Procedure Date : 05/06/2019 MRN: [id number] Attending MD: Thornton Park MD, MD Date of Birth: 04-28-1934 CSN: [id number] Age: 85 Admit Type: Inpatient Procedure:                Colonoscopy Indications:              Change in bowel habits Providers:                Thornton Park MD, MD, Cleda Daub, RN, Marguerita Merles, Technician, Charolette Child, Technician,                            Garrison [place], CRNA Referring MD:              Medicines:                See the Anesthesia note for documentation of the                            administered medications Complications:            Hypoxia and transient hypotension. Anesthesia                            requested that the procedure be terminated for                            patient safety. Estimated Blood Loss:     Estimated blood loss: none. Procedure:                Pre-Anesthesia Assessment:                           - Prior to the procedure, a History and Physical                            was performed, and patient medications and                            allergies were reviewed. The patient's tolerance of                            previous anesthesia was also reviewed. The risks                            and benefits of the procedure and the sedation                            options and risks were discussed with the patient.                            All questions were answered, and informed consent                            was obtained. Prior Anticoagulants:  The patient has                            taken Lovenox (enoxaparin), last dose was 1 day                            prior to procedure. ASA Grade Assessment: III - A                            patient with severe systemic disease. After                            reviewing the risks and benefits, the patient was                            deemed in satisfactory condition to  undergo the                            procedure.                           After obtaining informed consent, the colonoscope                            was passed under direct vision. Throughout the                            procedure, the patient's blood pressure, pulse, and                            oxygen saturations were monitored continuously. The                            CF-HQ190L ([id number]) Olympus colonoscope was                            introduced through the anus with the intention of                            advancing to the splenic flexure. The scope was                            advanced to the descending colon before the                            procedure was aborted. Medications were given. The                            colonoscopy was technically difficult and complex                            due to multiple diverticula in the colon,  restricted mobility of the colon and the patient's                            oxygen desaturation and transient hypotension. The                            procedure was aborted for patient safety. Scope In: 10:21:01 AM Scope Out: 10:29:47 AM Total Procedure Duration: 0 hours 8 minutes 46 seconds  Findings:      The perianal and digital rectal examinations were normal except for       external hemorrhoids and a skin tag.      Many small and large-mouthed diverticula were found in the sigmoid colon       and descending colon. There was no evidence of diverticular bleeding.       The colon was fixed and difficult to traverse beyond 50 cm. No       additional meaningful evaluation of the colon was performed. Impression:               - Incomplete colonoscopy due to patient instability.                           - Non-bleeding external hemorrhoids.                           - Severe diverticulosis in the sigmoid colon and in                            the descending colon. There was no evidence of                             diverticular bleeding.                           - No specimens collected. Recommendation:           - Consider contrasted cross-sectional imaging as                            her renal function will allow.                           - Advance diet as tolerated. High fiber diet                            recommended.                           - Continue present medications.                           - The results and my recommendations were discussed                            with the patient's daughters, [name] and Sharyn Lull  Fleischhacker, by phone. All questions were answered to                            their satisfaction. Procedure Code(s):        --- Professional ---                           [phone], 73, Colonoscopy, flexible; diagnostic,                            including collection of specimen(s) by brushing or                            washing, when performed (separate procedure) Diagnosis Code(s):        --- Professional ---                           R19.4, Change in bowel habit                           K57.30, Diverticulosis of large intestine without                            perforation or abscess without bleeding CPT copyright 2019 American Medical Association. All rights reserved. The codes documented in this report are preliminary and upon coder review may  be revised to meet current compliance requirements. Thornton Park MD, MD 05/06/2019 11:04:31 AM This report has been signed electronically. Number of Addenda: 0

## 2019-05-06 NOTE — Interval H&P Note (Signed)
History and Physical Interval Note:  05/06/2019 11:18 AM  Mary Harding  has presented today for surgery, with the diagnosis of Dysphagia & Change in Bowel Habits.  The various methods of treatment have been discussed with the patient and family. After consideration of risks, benefits and other options for treatment, the patient has consented to  Procedure(s): ESOPHAGOGASTRODUODENOSCOPY (EGD) (N/A) COLONOSCOPY (N/A) as a surgical intervention.  The patient's history has been reviewed, patient examined, no change in status, stable for surgery.  I have reviewed the patient's chart and labs.  Questions were answered to the patient's satisfaction.     Thornton Park

## 2019-05-06 NOTE — Progress Notes (Signed)
Patient not in room having a procedure done at this time. Will return to place IV if needed RN [facility] made aware.

## 2019-05-06 NOTE — Consult Note (Signed)
Gyn Onc Follow-up  Cytology from paracentesis showed stage adenocarcinoma favor gyn primary.  GI scopes negative for apparent malignancy.   Patient has apparent stage IIIC ovarian vs primary peritoneal cancer.  Recommendation is for neoadjuvant chemotherapy with a platinum doublet. This can be administered as an outpatient when the patient's underlying medical condition has been optimized for discharge.  I will facilitate referral to see Dr Heath Lark for evaluation as an outpatient for chemotherapy.  Thereasa Solo, MD Cell [phone]

## 2019-05-06 NOTE — Progress Notes (Signed)
Inpatient Diabetes Program Recommendations  AACE/ADA: New Consensus Statement on Inpatient Glycemic Control (2015)  Target Ranges:  Prepandial:   less than 140 mg/dL      Peak postprandial:   less than 180 mg/dL (1-2 hours)      Critically ill patients:  140 - 180 mg/dL   Lab Results  Component Value Date   GLUCAP 200 (H) 05/06/2019   HGBA1C 9.5 (H) 05/02/2019    Review of Glycemic Control Results for Mary Harding, Mary Harding (MRN [id number]) as of 05/06/2019 16:47  Ref. Range 05/06/2019 06:43 05/06/2019 08:14 05/06/2019 12:19  Glucose-Capillary Latest Ref Range: 70 - 99 mg/dL 238 (H) 245 (H) 200 (H)   Diabetes history: DM 2 Outpatient Diabetes medications: Novolog 3-10 units tid with meals, Lantus 24 units daily, VIctoza 1.8 mg daily Current orders for Inpatient glycemic control:  Novolog sensitive q 6 hours  Inpatient Diabetes Program Recommendations:    If appropriate, may consider restarting a portion of patient's home dose of basal insulin.  Consider Lantus 10 units daily.   Thanks,  Adah Perl, RN, BC-ADM Inpatient Diabetes Coordinator Pager [phone] (8a-5p)

## 2019-05-06 NOTE — Op Note (Addendum)
[facility] Patient Name: Mary Harding Procedure Date : 05/06/2019 MRN: [id number] Attending MD: Thornton Park MD, MD Date of Birth: Mar 05, 1934 CSN: [id number] Age: 85 Admit Type: Inpatient Procedure:                Upper GI endoscopy Indications:              Dysphagia Providers:                Thornton Park MD, MD, Cleda Daub, RN, Marguerita Merles, Technician, Charolette Child, Technician,                            Garrison Columbus, CRNA Referring MD:              Medicines:                See the Anesthesia note for documentation of the                            administered medications Complications:            Hypoxia, nasal regurgitation of gastric contents,                            no witnessed aspiration occurred Estimated Blood Loss:     Estimated blood loss: none. Procedure:                Pre-Anesthesia Assessment:                           - Prior to the procedure, a History and Physical                            was performed, and patient medications and                            allergies were reviewed. The patient's tolerance of                            previous anesthesia was also reviewed. The risks                            and benefits of the procedure and the sedation                            options and risks were discussed with the patient.                            All questions were answered, and informed consent                            was obtained. Prior Anticoagulants: The patient has                            taken Lovenox (  enoxaparin), last dose was 1 day                            prior to procedure. ASA Grade Assessment: III - A                            patient with severe systemic disease. After                            reviewing the risks and benefits, the patient was                            deemed in satisfactory condition to undergo the                            procedure.              After obtaining informed consent, the endoscope was                            passed under direct vision. Throughout the                            procedure, the patient's blood pressure, pulse, and                            oxygen saturations were monitored continuously. The                            GIF-H190 ([id number] Olympus gastroscope was                            introduced through the mouth, and advanced to the                            second part of duodenum. The upper GI endoscopy was                            unusually difficult due to the patient's medical                            instability. The patient tolerated the procedure                            well. Scope In: Scope Out: Findings:      LA Grade D (one or more mucosal breaks involving at least 75% of       esophageal circumference) esophagitis with bleeding was found. A       non-obstructing distal esophageal stricture is suspected. No dilation       was performed given the extent of esophagitis. Active reflux was noted       during the procedure.      Bilious fluid was found in the gastric body. Fluid aspiration was       performed. Limited evaluation of the mucosa showed no abnormalities.  The limited examined duodenum was normal. Impression:               - LA Grade D reflux esophagitis with                            non-obstructing distal esophageal stricture.                           - Esophageal reflux of gastric contents noted                            during the procedure.                           - Bilious gastric fluid. Fluid aspiration                            performed. No blood present.                           - Normal examined duodenum.                           - Exam limited due to pulmonary instability and                            regurgitation noted during the procedure. Recommendation:           - UGI series recommended for further evaluation.                            - Advance diet as tolerated today.                           - Continue present medications. Increase                            pantoprazole to 40 mg BID x 8 weeks. Add Carafate                            1g QID x 2 weeks.                           - Consider EGD with possible dilation after 8 weeks                            of BID PPI therapy if her dysphagia persists. Would                            recommend use of Reglan prior to the procedure to                            facilitate gastric emptying.                           - Recommend close monitoring for  aspiration: use a                            continuous pulse ox this afternoon, low threshold                            to obtain a CXR with pulmonary symptoms.                           - Patient considered high risk for general                            anesthesia per anesthesia. Therefore, exam was                            limited to the use of propofol today.                           - Results were discussed with the patient's                            daughters Mary Harding and Mary Harding by telephone) Procedure Code(s):        --- Professional ---                           [phone], Esophagogastroduodenoscopy, flexible,                            transoral; diagnostic, including collection of                            specimen(s) by brushing or washing, when performed                            (separate procedure) Diagnosis Code(s):        --- Professional ---                           K21.0, Gastro-esophageal reflux disease with                            esophagitis                           R13.10, Dysphagia, unspecified CPT copyright 2019 [employer]. All rights reserved. The codes documented in this report are preliminary and upon coder review may  be revised to meet current compliance requirements. Thornton Park MD, MD 05/06/2019 10:57:02 AM This report has been signed  electronically. Number of Addenda: 0

## 2019-05-07 ENCOUNTER — Encounter (HOSPITAL_COMMUNITY): Payer: Self-pay | Admitting: Gastroenterology

## 2019-05-07 ENCOUNTER — Inpatient Hospital Stay (HOSPITAL_COMMUNITY): Payer: Medicare Other

## 2019-05-07 DIAGNOSIS — D539 Nutritional anemia, unspecified: Secondary | ICD-10-CM

## 2019-05-07 DIAGNOSIS — C569 Malignant neoplasm of unspecified ovary: Secondary | ICD-10-CM

## 2019-05-07 DIAGNOSIS — R0602 Shortness of breath: Secondary | ICD-10-CM

## 2019-05-07 DIAGNOSIS — R180 Malignant ascites: Secondary | ICD-10-CM

## 2019-05-07 DIAGNOSIS — K5900 Constipation, unspecified: Secondary | ICD-10-CM

## 2019-05-07 DIAGNOSIS — Z7189 Other specified counseling: Secondary | ICD-10-CM

## 2019-05-07 DIAGNOSIS — N184 Chronic kidney disease, stage 4 (severe): Secondary | ICD-10-CM

## 2019-05-07 DIAGNOSIS — Z515 Encounter for palliative care: Secondary | ICD-10-CM

## 2019-05-07 LAB — CBC
HCT: 31.1 % — ABNORMAL LOW (ref 36.0–46.0)
Hemoglobin: 9.8 g/dL — ABNORMAL LOW (ref 12.0–15.0)
MCH: 30.4 pg (ref 26.0–34.0)
MCHC: 31.5 g/dL (ref 30.0–36.0)
MCV: 96.6 fL (ref 80.0–100.0)
Platelets: 400 10*3/uL (ref 150–400)
RBC: 3.22 MIL/uL — ABNORMAL LOW (ref 3.87–5.11)
RDW: 14.6 % (ref 11.5–15.5)
WBC: 18.8 10*3/uL — ABNORMAL HIGH (ref 4.0–10.5)
nRBC: 0 % (ref 0.0–0.2)

## 2019-05-07 LAB — BASIC METABOLIC PANEL
Anion gap: 12 (ref 5–15)
BUN: 15 mg/dL (ref 8–23)
CO2: 19 mmol/L — ABNORMAL LOW (ref 22–32)
Calcium: 8.7 mg/dL — ABNORMAL LOW (ref 8.9–10.3)
Chloride: 109 mmol/L (ref 98–111)
Creatinine, Ser: 2.59 mg/dL — ABNORMAL HIGH (ref 0.44–1.00)
GFR calc Af Amer: 19 mL/min — ABNORMAL LOW (ref >60)
GFR calc non Af Amer: 17 mL/min — ABNORMAL LOW (ref >60)
Glucose, Bld: 182 mg/dL — ABNORMAL HIGH (ref 70–99)
Potassium: 4.4 mmol/L (ref 3.5–5.1)
Sodium: 140 mmol/L (ref 135–145)

## 2019-05-07 LAB — GLUCOSE, CAPILLARY
Glucose-Capillary: 156 mg/dL — ABNORMAL HIGH (ref 70–99)
Glucose-Capillary: 180 mg/dL — ABNORMAL HIGH (ref 70–99)
Glucose-Capillary: 181 mg/dL — ABNORMAL HIGH (ref 70–99)
Glucose-Capillary: 184 mg/dL — ABNORMAL HIGH (ref 70–99)
Glucose-Capillary: 207 mg/dL — ABNORMAL HIGH (ref 70–99)

## 2019-05-07 MED ORDER — MORPHINE SULFATE (CONCENTRATE) 10 MG/0.5ML PO SOLN
5.0000 mg | ORAL | Status: DC | PRN
  Administered 2019-05-08: 5 mg via ORAL
  Filled 2019-05-07: qty 0.5

## 2019-05-07 MED ORDER — ONDANSETRON HCL 4 MG/2ML IJ SOLN
4.0000 mg | Freq: Four times a day (QID) | INTRAMUSCULAR | Status: AC
  Administered 2019-05-07 – 2019-05-08 (×4): 4 mg via INTRAVENOUS
  Filled 2019-05-07 (×4): qty 2

## 2019-05-07 MED ORDER — AMOXICILLIN-POT CLAVULANATE 500-125 MG PO TABS
1.0000 | ORAL_TABLET | Freq: Two times a day (BID) | ORAL | Status: DC
  Administered 2019-05-07 – 2019-05-08 (×3): 500 mg via ORAL
  Filled 2019-05-07 (×3): qty 1

## 2019-05-07 MED ORDER — LORAZEPAM 1 MG PO TABS: 1.0000 mg | ORAL_TABLET | ORAL | Status: DC | PRN

## 2019-05-07 MED ORDER — METOCLOPRAMIDE HCL 5 MG/ML IJ SOLN
5.0000 mg | Freq: Four times a day (QID) | INTRAMUSCULAR | Status: DC
  Administered 2019-05-07 – 2019-05-08 (×4): 5 mg via INTRAVENOUS
  Filled 2019-05-07 (×5): qty 2

## 2019-05-07 MED ORDER — DEXAMETHASONE SODIUM PHOSPHATE 10 MG/ML IJ SOLN
10.0000 mg | Freq: Once | INTRAMUSCULAR | Status: AC
  Administered 2019-05-07: 13:00:00 10 mg via INTRAVENOUS
  Filled 2019-05-07: qty 1

## 2019-05-07 MED ORDER — INSULIN ASPART 100 UNIT/ML ~~LOC~~ SOLN: 0.0000 [IU] | Freq: Three times a day (TID) | SUBCUTANEOUS | Status: DC

## 2019-05-07 NOTE — Care Management Important Message (Signed)
Important Message  Patient Details  Name: Mary Harding MRN: [id number] Date of Birth: February 09, 1934   Medicare Important Message Given:  Yes    Sharin Mons, RN 05/07/2019, 9:52 AM

## 2019-05-07 NOTE — Progress Notes (Addendum)
Daily Rounding Note  05/07/2019, 9:41 AM  LOS: 6 days   SUBJECTIVE:   Chief complaint:   Dysphagia. Patient had a rough night with shortness of breath.  Lots of coughing bringing up scant amounts of frothy sputum.  Still has difficulty swallowing.  Feels tired.  Feels short of breath.  Has some burning discomfort in region of the lower sternum/lower esophageal area.  OBJECTIVE:         Vital signs in last 24 hours:    Temp:  [97.7 F (36.5 C)-98.7 F (37.1 C)] 98.7 F (37.1 C) (05/20 0519) Pulse Rate:  [88-111] 92 (05/20 0834) Resp:  [16-22] 16 (05/20 0519) BP: (113-168)/(45-77) 152/65 (05/20 0834) SpO2:  [98 %-100 %] 100 % (05/20 0519) Weight:  [81.6 kg] 81.6 kg (05/20 0515) Last BM Date: 05/06/19 Filed Weights   05/06/19 0501 05/06/19 0906 05/07/19 0515  Weight: 81.9 kg 81.6 kg 81.6 kg   General: Looks ill, short of breath, spitting into the emesis bag. Heart: RRR. Chest: Diminished breath sounds on the left.  Weak cough. Abdomen: Soft.  Not tender or distended.  No HSM, masses, bruits.  Active bowel sounds. Extremities: No CCE.  Fully alert and oriented. Neuro/Psych: Pleasant, calm, intelligent.  Intake/Output from previous day: 05/19 0701 - 05/20 0700 In: 650 [P.O.:300; I.V.:350] Out: 400 [Urine:400]  Intake/Output this shift: No intake/output data recorded.  Lab Results: Recent Labs    05/06/19 0234 05/06/19 2123 05/07/19 0342  WBC 10.7* 24.0* 18.8*  HGB 10.3* 10.3* 9.8*  HCT 33.5* 32.5* 31.1*  PLT 377 412* 400   BMET Recent Labs    05/05/19 0233 05/06/19 0234 05/07/19 0342  NA 136 141 140  K 3.8 3.9 4.4  CL 107 111 109  CO2 22 17* 19*  GLUCOSE 117* 244* 182*  BUN 12 10 15   CREATININE 1.74* 1.99* 2.59*  CALCIUM 7.9* 8.3* 8.7*   LFT No results for input(s): PROT, ALBUMIN, AST, ALT, ALKPHOS, BILITOT, BILIDIR, IBILI in the last 72 hours. PT/INR Recent Labs    05/06/19 0234  LABPROT  14.3  INR 1.1   Hepatitis Panel No results for input(s): HEPBSAG, HCVAB, HEPAIGM, HEPBIGM in the last 72 hours.  Studies/Results: Dg Chest 2 View  Result Date: 05/06/2019 CLINICAL DATA:  Difficulty breathing EXAM: CHEST - 2 VIEW COMPARISON:  05/01/2019, 10/09/2017 FINDINGS: Small moderate bilateral left greater than right pleural effusions. Asymmetric left greater than right interstitial and ground-glass opacity. Normal heart size. No pneumothorax. IMPRESSION: Small moderate left greater than right pleural effusions. Mild bibasilar airspace disease with development of left greater than right ground-glass airspace opacity, suspicious for pneumonia. Could consider atypical or viral pneumonia in the appropriate clinical setting. Electronically Signed   By: Donavan Foil M.D.   On: 05/06/2019 16:18   Dg Chest Bilateral Decubitus  Result Date: 05/06/2019 CLINICAL DATA:  Difficulty breathing, vomiting EXAM: CHEST - BILATERAL DECUBITUS VIEW COMPARISON:  05/06/2019, 05/01/2019 FINDINGS: Moderate layering bilateral pleural effusions. Underlying ground-glass opacity within the left greater than right lungs. IMPRESSION: Moderate layering bilateral pleural effusions. Electronically Signed   By: Donavan Foil M.D.   On: 05/06/2019 16:15   Dg Abd Portable 1v  Result Date: 05/05/2019 CLINICAL DATA:  Enteric tube placement EXAM: PORTABLE ABDOMEN - 1 VIEW COMPARISON:  None. FINDINGS: The tip of the enteric tube projects over the stomach. The tip is pointed distally. The bowel gas pattern is nonspecific. IMPRESSION: NG tube tip projects over the gastric  body. The tip is pointed distally. Electronically Signed   By: Constance Holster M.D.   On: 05/05/2019 17:06    Scheduled Meds: . buPROPion  150 mg Oral Daily  . carvedilol  12.5 mg Oral BID WC  . ferrous sulfate  325 mg Oral BID WC  . insulin aspart  0-9 Units Subcutaneous Q6H  . lidocaine  1 patch Transdermal Q24H  . multivitamin with minerals  1 tablet  Oral Daily  . pantoprazole  40 mg Oral BID  . polyethylene glycol  17 g Oral BID  . potassium chloride  20 mEq Oral Daily  . rosuvastatin  10 mg Oral Daily  . senna-docusate  1 tablet Oral BID  . sucralfate  1 g Oral TID WC & HS   Continuous Infusions: PRN Meds:.alum & mag hydroxide-simeth, EPINEPHrine, [COMPLETED] Glycerin (Adult) **FOLLOWED BY** Glycerin (Adult), HYDROcodone-acetaminophen, nitroGLYCERIN, ondansetron (ZOFRAN) IV  ASSESMENT:   *   Dysphagia. 05/06/2019 flexible sigmoidoscopy.: Nonbleeding, external hemorrhoids.  Severe, dense sigmoid and descending colon diverticulosis but no bleeding.  Study limited to 50 cm due to fixed, difficult to traverse colon lung with transient hypoxia. 05/06/2019 EGD.  Grade D reflux esophagitis.  Nonobstructing distal esophageal stricture.  Gastric contents noted in esophageal reflux.  Bilious gastric fluid. Normal duodenum.   Exam limited due to pulmonary instability and intra-operative regurgitation.  Upper GI series recommended for further evaluation.  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 Pelvic masses.  Ascites.  Elevated CA125. S/p paracentesis, fluid cytology: metastatic adenocarcinoma c/w gynecologic primary  *   Suspect Aspiration PNA  *    Nodular liver versus widespread mets per CT scan.  Coags, platelets, LFTs normal.  No evidence of portal hypertensive gastropathy or varices on EGD yesterday.  *    Normocytic anemia.  *    AKI/CKD.  *   Chronic Plavix.  Recently held to allow for diagnostic studies.  PLAN    *   Protonix 40 mg twice daily for 8 weeks.  Carafate 1 g 4 times daily for 2 weeks.  *   Consider EGD with possible esophageal dilatation after 8 weeks of twice daily therapy if dysphagia persists.  Administer Reglan prior to procedure to facilitate gastric emptying.   Given all of her comorbidities, repeat EGD may not be possible.    *  Adding Reglan now, may help gastric emptying and associated reflux  *   Referral to Dr Alvy Bimler for outpt neoadjuvant chemotherapy with a platinum doublet.  *   Messaged Dr Tarry Kos about starting abx for suspected aspiration PNA, she will soon  discuss pt on rounds and make decision re abx   *   Talked to Mrs Liguori about getting a palliative care consult.  She definitely has a more DNR approach to end-of-life issues.  Her son, lives in [place], is the [place] but I am not sure if she has formal healthcare power of attorney papers drawn up.  Given all the new diagnosis she has, she ought to meet with palliative care with her kids coming in with a telephone conference call for this.    [name]  05/07/2019, 9:41 AM Phone [phone]

## 2019-05-07 NOTE — Consult Note (Signed)
Consultation Note Date: 05/07/2019   Patient Name: Mary Harding  DOB: 1934-10-12  MRN: [id number]  Age / Sex: 85 y.o., female  PCP: Deland Pretty, MD Referring Physician: Zenia Resides, MD  Reason for Consultation: Establishing goals of care  HPI/Patient Profile: 85 y.o. female  with past medical history of CAD, HTN, DM2, CKD IV, anxiety/depression, OSA, admitted on 05/01/2019 with constipation, nausea and vomiting. 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 She is s/p paracentesis with 3L removed. She had EGD/and Colonoscopy for complaints of globus and chest pain which revealed reflux esophigitis. Additionally, she has developed aspiration pneumonia as complication from EGD. Palliative medicine consulted for [place].   Clinical Assessment and Goals of Care: . I have reviewed medical records including EPIC notes, labs and imaging, assessed the patient and then met at the bedside to discuss diagnosis prognosis, GOC, EOL wishes, disposition and options.  I introduced Palliative Medicine as specialized medical care for people living with serious illness. It focuses on providing relief from the symptoms and stress of a serious illness. The goal is to improve quality of life for both the patient and the family.  We discussed a brief life review of the patient. Mary Harding is a remarkable woman who has lead an extraordinary life.  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 She is widowed and has six children. 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 She is passionate about social justice for women and especially for black women.   As far as functional and nutritional status - she has noted significant decline especially in the  last six months. She states she used to be independent and ambulatory. She has felt shaky, and had some falls at home. She requires a cane for walking. She notes that now she has difficulty even getting up to the bedside toilet.  We discussed her current illness and what it means in the larger context of her on-going co-morbidities.  Natural disease trajectory and expectations at EOL were discussed. Mary Harding is very clear that she does not want chemotherapy or radiation treatments for her cancer. Her wishes are to focus on comfort, eliminating suffering and pain. Mary Harding states she wishes to be at home,  It is important to her that she does not become a burden on her children. It is also important to her that her children respect her wishes.   A conference call was held with several of patient's children and Dr. Andria Frames present. Patient was given support to express her wishes to her children. Her children her caring and accepting of her wishes. Hospice philosophy and support were explained.   The difference between aggressive medical intervention and comfort care was considered in light of the patient's goals of care. Ms. Mangen prefers to proceed with avoiding aggressive medical interventions. She does not want artificial feeding or hydration at end of life.  She requested DNR status.   Advanced directives, concepts specific to code status, artifical feeding and hydration, and rehospitalization were considered and discussed. Living Will and HCPOA were completed- she appointed her son Mary Harding and daughter Mary Harding as [place].   We discussed her symptoms- she complains ongoing nausea and vomiting. She has shortness of breath that is worsened by anxiety, has some relief with supplemental oxygen. She has pain from chronic back pain, as well as occasional pain in her abdomen.   Hospice and Palliative Care services outpatient were explained and offered.   Questions and concerns were addressed. The family was  encouraged to call with questions or concerns.    Primary Decision Maker PATIENT    SUMMARY OF RECOMMENDATIONS -DNR -Dexamethasone 75m IV x 1 for nausea  -Ondansetron 433mIV q6hr x 4 doses- recommend transition to 66m58mDT q6hr PRN at discharge -Morphine concentrate 5mg46mhr prn for SOB or pain -Lorazepam 1mg 32mor PO q4hr prn for anxiety or sleep -Continue current bowel regimen- senna 1 po bid, miralax 17gm bid- discussed with patient importance of maintaining bowel and risk for increased constipation with ondansetron and opioid use -Chaplain consult to notarize living will and HCPOA that is completed and left at bedside -Care management consult for home with Hospice- patient will need equipment in the home prior to discharge -Foley catheter ordered due to patient having difficulty/SOB when getting out of bed   Prognosis:    < 6 months due to metastatic cancer in setting of chronic kidney disease, progressive decline in functional status, transition to comfort measures  Discharge Planning: Home with Hospice  Primary Diagnoses: Present on Admission: . Liver lesion . Ascites . Pelvic mass . Severe malnutrition (HCC) [place] have reviewed the medical record, interviewed the patient and family, and examined the patient. The following aspects are pertinent.  Past Medical History:  Diagnosis Date  . Anxiety   . Arthritis    mild  . Blood transfusion 1960s   "related to having babies" (10/01/2018)  . Chronic lower back pain    hx ruptured disc;cyst on nerve ending  . CKD (chronic kidney disease), stage IV (HCC) [place] Coronary artery disease   . Depression   . Edema extremities    pt takes Torsemide daily  . Family history of adverse reaction to anesthesia    "I've had several people die from anesthesia in my family; malignant hyperthermia" (10/01/2018)  . GERD (gastroesophageal reflux disease)    takes Protonix daily  . Heart murmur   . History of shingles    73yrs 43yr .  Hyperlipidemia    takes Crestor daily  . Hypertension    takes Amlodipine daily  . Nocturia   . Peripheral neuropathy    left  . Sciatica    left side  . Shortness of breath    with exertion  . Sleep apnea    sleep study done in 2012;used CPAP "in the past; not anymore" (10/01/2018)  . Type II diabetes mellitus (HCC)  [place] Glimepiride,Metformin,and Victoza;Lantus bid  . Urinary frequency    takes Detrol daily   Social History   Socioeconomic History  . Marital status: Widowed    Spouse name: Not on file  . Number of children: Not on file  . Years of education: Not on file  . Highest education level: Not on file  Occupational History  . Not on file  Social Needs  . Financial resource strain: Not on  file  . Food insecurity:    Worry: Not on file    Inability: Not on file  . Transportation needs:    Medical: Not on file    Non-medical: Not on file  Tobacco Use  . Smoking status: Former Smoker    Packs/day: 1.00    Years: 15.00    Pack years: 15.00    Types: Cigarettes    Last attempt to quit: 01/18/1997    Years since quitting: 22.3  . Smokeless tobacco: Never Used  Substance and Sexual Activity  . Alcohol use: Not Currently  . Drug use: Never  . Sexual activity: Not Currently  Lifestyle  . Physical activity:    Days per week: Not on file    Minutes per session: Not on file  . Stress: Not on file  Relationships  . Social connections:    Talks on phone: Not on file    Gets together: Not on file    Attends religious service: Not on file    Active member of club or organization: Not on file    Attends meetings of clubs or organizations: Not on file    Relationship status: Not on file  Other Topics Concern  . Not on file  Social History Narrative  . Not on file   Family History  Problem Relation Age of Onset  . Diabetes Mother   . Dementia Mother   . Early death Father   . Diabetes Sister   . Heart disease Sister   . Heart disease Maternal  Grandfather   . Anesthesia problems Neg Hx   . Hypotension Neg Hx   . Malignant hyperthermia Neg Hx   . Pseudochol deficiency Neg Hx    Scheduled Meds: . amoxicillin-clavulanate  1 tablet Oral BID  . buPROPion  150 mg Oral Daily  . carvedilol  12.5 mg Oral BID WC  . dexamethasone  10 mg Intravenous Once  . ferrous sulfate  325 mg Oral BID WC  . insulin aspart  0-9 Units Subcutaneous Q6H  . lidocaine  1 patch Transdermal Q24H  . metoCLOPramide (REGLAN) injection  5 mg Intravenous Q6H  . multivitamin with minerals  1 tablet Oral Daily  . ondansetron (ZOFRAN) IV  4 mg Intravenous Q6H  . pantoprazole  40 mg Oral BID  . polyethylene glycol  17 g Oral BID  . potassium chloride  20 mEq Oral Daily  . rosuvastatin  10 mg Oral Daily  . senna-docusate  1 tablet Oral BID  . sucralfate  1 g Oral TID WC & HS   Continuous Infusions: PRN Meds:.alum & mag hydroxide-simeth, EPINEPHrine, [COMPLETED] Glycerin (Adult) **FOLLOWED BY** Glycerin (Adult), LORazepam, morphine CONCENTRATE, nitroGLYCERIN Medications Prior to Admission:  Prior to Admission medications   Medication Sig Start Date End Date Taking? Authorizing Provider  buPROPion (WELLBUTRIN XL) 150 MG 24 hr tablet Take 150 mg by mouth daily. 08/05/18  Yes [provider]  carvedilol (COREG) 12.5 MG tablet Take 12.5 mg by mouth 2 (two) times daily with a meal.   Yes [provider]  clopidogrel (PLAVIX) 75 MG tablet Take 1 tablet (75 mg total) by mouth daily with breakfast. Patient taking differently: Take 75 mg by mouth every other day.  04/10/14  Yes Adrian Prows, MD  dexlansoprazole (DEXILANT) 60 MG capsule Take 60 mg by mouth daily.   Yes [provider]  diclofenac sodium (VOLTAREN) 1 % GEL Apply 1 application topically 4 (four) times daily as needed (for pain- apply to  affected sites).    Yes [provider]  EPINEPHrine (EPIPEN) 0.3 mg/0.3 mL SOAJ injection Inject 0.3 mg into the muscle once as needed for  anaphylaxis (for bee stings).    Yes [provider]  ferrous sulfate 325 (65 FE) MG tablet Take 325 mg by mouth 2 (two) times daily.   Yes [provider]  furosemide (LASIX) 20 MG tablet Take 20 mg by mouth daily.   Yes [provider]  insulin aspart (NOVOLOG) 100 UNIT/ML injection Inject 3-10 Units into the skin 3 (three) times daily before meals.    Yes [provider]  LANTUS SOLOSTAR 100 UNIT/ML Solostar Pen Inject 24 Units into the skin. 01/01/19  Yes [provider]  Liraglutide (VICTOZA) 18 MG/3ML SOLN Inject 1.8 mg into the skin daily.    Yes [provider]  Multiple Vitamins-Minerals (MULTIVITAMIN WITH MINERALS) tablet Take 1 tablet by mouth daily.   Yes [provider]  Naphazoline HCl (CLEAR EYES OP) Place 1 drop into both eyes daily as needed (dry eyes).   Yes [provider]  nitroGLYCERIN (NITROSTAT) 0.4 MG SL tablet Place 0.4 mg under the tongue every 5 (five) minutes as needed for chest pain.   Yes [provider]  pantoprazole (PROTONIX) 40 MG tablet Take 40 mg by mouth daily.   Yes [provider]  Potassium Chloride ER 20 MEQ TBCR Take 20 mEq by mouth daily. 11/25/18  Yes [provider]  rosuvastatin (CRESTOR) 10 MG tablet Take 10 mg by mouth daily.   Yes [provider]  senna-docusate (PERI-COLACE) 8.6-50 MG tablet Take 1 tablet by mouth 2 (two) times daily.   Yes [provider]  traMADol (ULTRAM) 50 MG tablet Take 50 mg by mouth every 6 (six) hours as needed for moderate pain.   Yes [provider]  aspirin 81 MG tablet Take 1 tablet (81 mg total) by mouth daily. Patient not taking: Reported on 05/01/2019 10/02/18   Adrian Prows, MD   Allergies  Allergen Reactions  . Bee Venom Anaphylaxis  . Other Other (See Comments)    general anesthesia- Malignant hypothermia    Review of Systems  Constitutional: Positive for activity change, appetite change  and fatigue.  Respiratory: Positive for cough and shortness of breath.   Gastrointestinal: Positive for abdominal distention, abdominal pain and constipation.    Physical Exam Vitals signs and nursing note reviewed.  Constitutional:      Appearance: Normal appearance.  HENT:     Head: Normocephalic and atraumatic.  Cardiovascular:     Rate and Rhythm: Normal rate and regular rhythm.  Pulmonary:     Effort: Pulmonary effort is normal.  Neurological:     Mental Status: She is alert and oriented to person, place, and time.  Psychiatric:        Mood and Affect: Mood normal.        Behavior: Behavior normal.     Vital Signs: BP (!) 152/65   Pulse 92   Temp 98.7 F (37.1 C)   Resp 16   Ht 5' 5"  (1.651 m)   Wt 81.6 kg   SpO2 100%   BMI 29.94 kg/m  Pain Scale: 0-10   Pain Score: 0-No pain   SpO2: SpO2: 100 % O2 Device:SpO2: 100 % O2 Flow Rate: .O2 Flow Rate (L/min): 3 L/min  IO: Intake/output summary:   Intake/Output Summary (Last 24 hours) at 05/07/2019 1220 Last data filed at 05/06/2019 2121 Gross per 24 hour  Intake 300 ml  Output 400 ml  Net -100 ml    LBM: Last BM Date: 05/06/19 Baseline Weight: Weight: 73.7 kg Most recent weight: Weight: 81.6 kg     Palliative Assessment/Data: PPS: 30%   Flowsheet Rows     Most Recent Value  Intake Tab  Unit at Time of Referral  Med/Surg Unit  Palliative Care Primary Diagnosis  Cancer  Date Notified  05/06/19  Palliative Care Type  New Palliative care  Reason for referral  Clarify Goals of Care  Date of Admission  05/01/19  # of days IP prior to Palliative referral  5  Clinical Assessment  Psychosocial & Spiritual Assessment  Palliative Care Outcomes      Thank you for this consult. Palliative medicine will continue to follow and assist as needed.   Time In: 1000 Time Out: 1145 Time In: 1500 Time Out:1630 Time Total: 150 minute Prolonged services billed: yes Greater than 50%  of this time was spent  counseling and coordinating care related to the above assessment and plan.  Signed by: Mariana Kaufman, AGNP-C Palliative Medicine    Please contact Palliative Medicine Team phone at [phone] for questions and concerns.  For individual provider: See Shea Evans

## 2019-05-07 NOTE — Consult Note (Addendum)
[place]  [phone] [phone]   MEDICAL ONCOLOGY - INITIAL CONSULTATION  Referral MD: Dr. 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 is an 85 year old female with a past medical history including CAD, HTN, HLD, T2DM, CKD IV, anxiety/depression, arthritis, OSA, peripheral neuropathy, and PAD. She has a one-week history of constipation and thin stools.  She also reported difficulty swallowing foods and liquids and had intermittent gagging/vomiting.  The patient also had low back pain which is chronic but pain had worsened.  She was seen by her primary care provider due to the symptoms and had a CT of the abdomen pelvis performed and based on the results, her primary care provider told her to come to the emergency room.  The CT of the abdomen and pelvis was performed without contrast due to her chronic kidney disease and this showed moderate to large bilateral pleural effusions, moderate to large volume abdominal ascites with some haziness of the omentum and mesentery without definite peritoneal implants, mildly nodular appearance of the liver which can be seen with cirrhosis, cholelithiasis, wall thickening of the gastric body and fundus of unknown clinical significance, enlarged left ovary measuring 3 x 4.1 cm.  There also appears to be diffuse thickening of the endometrial stripe.  In the emergency room, labs were significant for a white blood cell count of 11, hemoglobin 10.9, serum creatinine 2.1.  She had a bedside paracentesis performed.  3 L of fluid was removed and sent for cytology.  Fluid was positive for adenocarcinoma consistent with a GYN primary.  A CA125 was also ordered and found to be elevated at 594.0.  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  When seen today, the patient reports generalized fatigue and weakness.  She reports anorexia and had a weight loss of 20 to 30 pounds but gained her weight back which she thinks is related to fluid in her abdomen.  She reports intermittent headaches and dizziness when she stands up too quickly.  Continues to have difficulty with swallowing liquids, food, and pills.  Reports intermittent chest discomfort particularly when she takes a deep breath.  She reports some shortness of breath and is currently on oxygen.  She reports a frothy productive cough.  Denies hemoptysis.  The patient denies nausea but reports that she vomits or gags when she swallows because of food getting stuck.  Has ongoing constipation but improved with medication.  Reports longstanding peripheral neuropathy which worsened with her ascites but improved following the paracentesis.  Denies lower extremity edema.  She reports a family history significant for a mother with cancer of her sinuses, sister with lung cancer, another sister with uterine cancer.  The patient is widowed.  She has 6 children.  Denies alcohol use.  Has a history of smoking 1 pack a day of cigarettes for about 20 years.  Medical oncology was asked see the patient to make recommendations for neoadjuvant chemotherapy for her ovarian cancer vs. Peritoneal cancer.    Past Medical History:  Diagnosis Date  . Anxiety   . Arthritis    mild  . Blood transfusion 1960s   "related to having babies" (10/01/2018)  . Chronic lower back pain    hx ruptured disc;cyst on nerve ending  . CKD (chronic kidney disease), stage  IV ([place])   . Coronary artery disease   . Depression   . Edema extremities    pt takes Torsemide daily  . Family history of adverse reaction to anesthesia    "I've had several people die from anesthesia in my family; malignant hyperthermia" (10/01/2018)  . GERD (gastroesophageal reflux disease)    takes Protonix daily  . Heart murmur    . History of shingles    71yr ago  . Hyperlipidemia    takes Crestor daily  . Hypertension    takes Amlodipine daily  . Nocturia   . Peripheral neuropathy    left  . Sciatica    left side  . Shortness of breath    with exertion  . Sleep apnea    sleep study done in 2012;used CPAP "in the past; not anymore" (10/01/2018)  . Type II diabetes mellitus ([place]    takes Glimepiride,Metformin,and Victoza;Lantus bid  . Urinary frequency    takes Detrol daily  :  Past Surgical History:  Procedure Laterality Date  . ABDOMINAL AORTOGRAM N/A 10/01/2018   Procedure: ABDOMINAL AORTOGRAM;  Surgeon: GAdrian Prows MD;  [facility];  Service: Cardiovascular;  Laterality: N/A;  . CATARACT EXTRACTION W/PHACO  02/14/2012   Procedure: CATARACT EXTRACTION PHACO AND INTRAOCULAR LENS PLACEMENT (IOC);  Surgeon: GAdonis Brook MD;  [facility]  Service: Ophthalmology;  Laterality: Left;  . COLONOSCOPY    . DILATION AND CURETTAGE OF UTERUS    . ESOPHAGOGASTRODUODENOSCOPY    . EYE SURGERY    . LEFT HEART CATHETERIZATION WITH CORONARY ANGIOGRAM N/A 04/09/2014   Procedure: LEFT HEART CATHETERIZATION WITH CORONARY ANGIOGRAM;  Surgeon: JLaverda Page MD;  [facility];  Service: Cardiovascular;  Laterality: N/A;  . LOWER EXTREMITY ANGIOGRAPHY Bilateral 10/01/2018   Procedure: LOWER EXTREMITY ANGIOGRAPHY;  Surgeon: GAdrian Prows MD;  [facility];  Service: Cardiovascular;  Laterality: Bilateral;  . LYMPH NODE DISSECTION Left 1970s   "neck"  . PARS PLANA VITRECTOMY  02/14/2012   Procedure: PARS PLANA VITRECTOMY WITH 23 GAUGE;  Surgeon: GAdonis Brook MD;  [facility]  Service: Ophthalmology;  Laterality: Left;  . PERIPHERAL VASCULAR ATHERECTOMY Right 10/01/2018   SFA WITH DRUG COATED BALLOON  . PERIPHERAL VASCULAR ATHERECTOMY Right 10/01/2018   Procedure: PERIPHERAL VASCULAR ATHERECTOMY;  Surgeon: GAdrian Prows MD;  [facility];  Service:  Cardiovascular;  Laterality: Right;  SFA WITH DRUG COATED BALLOON  . REFRACTIVE SURGERY    . TUBAL LIGATION    :  Current Facility-Administered Medications  Medication Dose Route Frequency Provider Last Rate Last Dose  . alum & mag hydroxide-simeth (MAALOX/MYLANTA) 200-200-20 MG/5ML suspension 30 mL  30 mL Oral Q4H PRN BThornton Park MD   30 mL at 05/06/19 1838  . amoxicillin-clavulanate (AUGMENTIN) 500-125 MG per tablet 500 mg  1 tablet Oral BID FGuadalupe Dawn MD      . buPROPion (WELLBUTRIN XL) 24 hr tablet 150 mg  150 mg Oral Daily BThornton Park MD   150 mg at 05/07/19 0835  . carvedilol (COREG) tablet 12.5 mg  12.5 mg Oral BID WC BThornton Park MD   12.5 mg at 05/07/19 0835  . dexamethasone (DECADRON) injection 10 mg  10 mg Intravenous Once MEarlie Counts NP      . EPINEPHrine (EPI-PEN) injection 0.3 mg  0.3 mg Intramuscular Once PRN BThornton Park MD      . ferrous sulfate tablet 325 mg  325 mg Oral BID  WC Thornton Park, MD   325 mg at 05/07/19 0835  . Glycerin (Adult) 2.1 g suppository 1 suppository  1 suppository Rectal Daily PRN Thornton Park, MD      . insulin aspart (novoLOG) injection 0-9 Units  0-9 Units Subcutaneous Q6H Thornton Park, MD   2 Units at 05/07/19 [phone]  . lidocaine (LIDODERM) 5 % 1 patch  1 patch Transdermal Q24H Thornton Park, MD   1 patch at 05/07/19 0840  . LORazepam (ATIVAN) tablet 1 mg  1 mg Oral Q4H PRN Earlie Counts, NP      . metoCLOPramide (REGLAN) injection 5 mg  5 mg Intravenous Q6H Gribbin, Sarah J, PA-C      . morphine CONCENTRATE 10 MG/0.5ML oral solution 5 mg  5 mg Oral Q1H PRN Earlie Counts, NP      . multivitamin with minerals tablet 1 tablet  1 tablet Oral Daily Thornton Park, MD   1 tablet at 05/07/19 0840  . nitroGLYCERIN (NITROSTAT) SL tablet 0.4 mg  0.4 mg Sublingual Q5 min PRN Thornton Park, MD      . ondansetron [facility]) injection 4 mg  4 mg Intravenous Q6H Mahan, Kasie J, NP      . pantoprazole  (PROTONIX) EC tablet 40 mg  40 mg Oral BID Mullis, Kiersten P, DO   40 mg at 05/07/19 0840  . polyethylene glycol (MIRALAX / GLYCOLAX) packet 17 g  17 g Oral BID Thornton Park, MD   17 g at 05/07/19 0835  . potassium chloride (K-DUR) CR tablet 20 mEq  20 mEq Oral Daily Thornton Park, MD   20 mEq at 05/07/19 0835  . rosuvastatin (CRESTOR) tablet 10 mg  10 mg Oral Daily Thornton Park, MD   10 mg at 05/07/19 0835  . senna-docusate (Senokot-S) tablet 1 tablet  1 tablet Oral BID Thornton Park, MD   1 tablet at 05/07/19 [phone]  . sucralfate (CARAFATE) tablet 1 g  1 g Oral TID WC & HS Mullis, Kiersten P, DO   1 g at 05/07/19 1749     Allergies  Allergen Reactions  . Bee Venom Anaphylaxis  . Other Other (See Comments)    general anesthesia- Malignant hypothermia   :  Family History  Problem Relation Age of Onset  . Diabetes Mother   . Dementia Mother   . Early death Father   . Diabetes Sister   . Heart disease Sister   . Heart disease Maternal Grandfather   . Anesthesia problems Neg Hx   . Hypotension Neg Hx   . Malignant hyperthermia Neg Hx   . Pseudochol deficiency Neg Hx   :  Social History   Socioeconomic History  . Marital status: Widowed    Spouse name: Not on file  . Number of children: Not on file  . Years of education: Not on file  . Highest education level: Not on file  Occupational History  . Not on file  Social Needs  . Financial resource strain: Not on file  . Food insecurity:    Worry: Not on file    Inability: Not on file  . Transportation needs:    Medical: Not on file    Non-medical: Not on file  Tobacco Use  . Smoking status: Former Smoker    Packs/day: 1.00    Years: 15.00    Pack years: 15.00    Types: Cigarettes    Last attempt to quit: 01/18/1997    Years since quitting: 22.3  .  Smokeless tobacco: Never Used  Substance and Sexual Activity  . Alcohol use: Not Currently  . Drug use: Never  . Sexual activity: Not Currently  Lifestyle   . Physical activity:    Days per week: Not on file    Minutes per session: Not on file  . Stress: Not on file  Relationships  . Social connections:    Talks on phone: Not on file    Gets together: Not on file    Attends religious service: Not on file    Active member of club or organization: Not on file    Attends meetings of clubs or organizations: Not on file    Relationship status: Not on file  . Intimate partner violence:    Fear of current or ex partner: Not on file    Emotionally abused: Not on file    Physically abused: Not on file    Forced sexual activity: Not on file  Other Topics Concern  . Not on file  Social History Narrative  . Not on file  :  Review of Systems: A comprehensive review of systems was negative except as noted in the HPI.  Exam: Patient Vitals for the past 24 hrs:  BP Temp Temp src Pulse Resp SpO2 Weight  05/07/19 0834 (!) 152/65 - - 92 - - -  05/07/19 0519 (!) 144/77 98.7 F (37.1 C) - 88 16 100 % -  05/07/19 0515 - - - - - - 179 lb 14.3 oz (81.6 kg)  05/06/19 2118 - - - - - 98 % -  05/06/19 2116 (!) 168/72 97.7 F (36.5 C) - (!) 102 (!) 22 - -  05/06/19 1755 (!) 113/45 97.8 F (36.6 C) Oral 99 20 99 % -  05/06/19 1217 (!) 151/71 - - (!) 111 20 100 % -    General: Alert, no acute distress.  Eyes:  no scleral icterus.  ENT:  There were no oropharyngeal lesions.  Neck was without thyromegaly.  Lymphatics:  Negative cervical, supraclavicular or axillary adenopathy.  Respiratory: lungs with rales left base.  Cardiovascular:  Regular rate and rhythm, S1/S2, without murmur, rub or gallop.  There was no pedal edema.  GI:  abdomen was soft, flat, nontender, nondistended, without organomegaly.  Muscoloskeletal:  no spinal tenderness of palpation of vertebral spine.  Skin exam was without echymosis, petichae.  Neuro exam was nonfocal.  Patient was alert and oriented.  Attention was good.   Language was appropriate.  Mood was normal without depression.   Speech was not pressured.  Thought content was not tangential.     Lab Results  Component Value Date   WBC 18.8 (H) 05/07/2019   HGB 9.8 (L) 05/07/2019   HCT 31.1 (L) 05/07/2019   PLT 400 05/07/2019   GLUCOSE 182 (H) 05/07/2019   CHOL 94 05/02/2019   TRIG 121 05/02/2019   HDL 32 (L) 05/02/2019   LDLCALC 38 05/02/2019   ALT 12 05/01/2019   AST 16 05/01/2019   NA 140 05/07/2019   K 4.4 05/07/2019   CL 109 05/07/2019   CREATININE 2.59 (H) 05/07/2019   BUN 15 05/07/2019   CO2 19 (L) 05/07/2019   I have personally reviewed her CT imaging  Ct Abdomen Pelvis Wo Contrast  Result Date: 05/01/2019 CLINICAL DATA:  Abdominal pain. Constipation x5 days with nausea and vomiting. EXAM: CT ABDOMEN AND PELVIS WITHOUT CONTRAST TECHNIQUE: Multidetector CT imaging of the abdomen and pelvis was performed following the standard protocol without IV  contrast. COMPARISON:  None. FINDINGS: Lower chest: There are moderate to large bilateral pleural effusions. Bibasilar atelectasis is noted. Calcifications are noted of the mitral valve and coronary arteries. Atherosclerotic changes are noted of the thoracic aorta. Hepatobiliary: The liver contour appears somewhat nodular. There is no discrete hepatic mass, however evaluation is significantly limited in the absence of IV contrast. There is cholelithiasis without definite CT evidence of acute cholecystitis. The common bile duct is not well appreciated but there does not appear to be overt biliary ductal dilatation. Pancreas: Unremarkable. No pancreatic ductal dilatation or surrounding inflammatory changes. Spleen: Normal in size without focal abnormality. Adrenals/Urinary Tract: No hydronephrosis. No radiopaque kidney stones. The bladder is decompressed which limits evaluations. The adrenal glands are unremarkable. Stomach/Bowel: There is scattered colonic diverticula without CT evidence of diverticulitis. There is a moderate amount of stool in the colon. The appendix  is located in the right lower quadrant and is unremarkable. There is no bowel obstruction. There appears to be some tethering of the small bowel loops, which are mostly centrally located. There appears to be diffuse wall thickening of the gastric body and fundus. The esophagus is dilated and fluid-filled. There are few low-attenuation cystic structures in the upper abdomen (axial series 2, image 39) which are not well evaluated. These could represent mildly dilated loops of small bowel. Vascular/Lymphatic: Aortic atherosclerosis. No enlarged abdominal or pelvic lymph nodes. Reproductive: The left ovary appears enlarged for the patient's age measuring approximately 4.1 by 3 cm. The right ovary is unremarkable. There appears to be some diffuse thickening of the endometrial canal, however this is suboptimally evaluated secondary to lack of IV contrast. Other: There is a large volume of abdominal ascites. There is some haziness of the mesentery. There are no definite peritoneal implants, however evaluation is significantly limited by lack of IV contrast. Musculoskeletal: No acute or significant osseous findings. IMPRESSION: 1. Moderate to large bilateral pleural effusions. 2. Moderate to large volume of abdominal ascites. There is some haziness of the omentum and mesentery without definite peritoneal implants. However, evaluation is limited by lack of IV contrast. Fluid evaluation of the ascites is recommended to help exclude a malignant cause. 3. Mildly nodular appearance to the liver which can be seen in patients with cirrhosis. 4. Cholelithiasis without CT evidence of acute cholecystitis. 5. Wall thickening of the gastric body and fundus of unknown clinical significance. In addition, there is fluid dilation of the distal esophagus. Further evaluation with EGD should be considered. 6. Enlarged left ovary measuring 3 x 4.1 cm. Further evaluation with ultrasound is recommended. In addition, there appears to be diffuse  thickening of the endometrial stripe. This should be further evaluated with ultrasound. 7. Moderate amount of stool throughout the colon. No evidence of a small-bowel obstruction. No CT evidence of diverticulitis. Aortic Atherosclerosis (ICD10-I70.0). Electronically Signed   By: Constance Holster M.D.   On: 05/01/2019 13:42   Dg Chest 2 View  Result Date: 05/06/2019 CLINICAL DATA:  Difficulty breathing EXAM: CHEST - 2 VIEW COMPARISON:  05/01/2019, 10/09/2017 FINDINGS: Small moderate bilateral left greater than right pleural effusions. Asymmetric left greater than right interstitial and ground-glass opacity. Normal heart size. No pneumothorax. IMPRESSION: Small moderate left greater than right pleural effusions. Mild bibasilar airspace disease with development of left greater than right ground-glass airspace opacity, suspicious for pneumonia. Could consider atypical or viral pneumonia in the appropriate clinical setting. Electronically Signed   By: Donavan Foil M.D.   On: 05/06/2019 16:18   US Abdomen  Complete  Result Date: 05/02/2019 CLINICAL DATA:  Liver lesions.  Ascites. EXAM: ABDOMEN ULTRASOUND COMPLETE COMPARISON:  Abdominal CT yesterday. FINDINGS: Gallbladder: Physiologically distended. Multiple gallstones largest measuring 11 mm. No gallbladder wall thickening. No sonographic Murphy sign noted by sonographer. Common bile duct: Diameter: 5 mm, normal. Liver: Diffusely heterogeneous parenchyma without well-defined focal lesion. Suggestion of slight capsular nodularity. Portal vein is patent on color Doppler imaging with normal direction of blood flow towards the liver. IVC: No abnormality visualized. Pancreas: Visualized portion unremarkable. Spleen: Slightly small in size.  No focal abnormality. Right Kidney: Length: 9.8 cm. Echogenicity within normal limits. No mass or hydronephrosis visualized. Left Kidney: Length: 8.6 cm. Echogenicity within normal limits. No mass or hydronephrosis visualized.  Abdominal aorta: No aneurysm visualized. Other findings: Ascites and bilateral pleural effusions. IMPRESSION: 1. Diffusely heterogeneous liver parenchyma without well-defined mass. Suggestion of slight capsular nodularity. Recommend correlation for cirrhosis risk factors. 2. Gallstones without sonographic findings of acute cholecystitis. 3. Ascites and pleural effusions. Electronically Signed   By: Keith Rake M.D.   On: 05/02/2019 03:19   Dg Chest Bilateral Decubitus  Result Date: 05/06/2019 CLINICAL DATA:  Difficulty breathing, vomiting EXAM: CHEST - BILATERAL DECUBITUS VIEW COMPARISON:  05/06/2019, 05/01/2019 FINDINGS: Moderate layering bilateral pleural effusions. Underlying ground-glass opacity within the left greater than right lungs. IMPRESSION: Moderate layering bilateral pleural effusions. Electronically Signed   By: Donavan Foil M.D.   On: 05/06/2019 16:15   Dg Chest Portable 1 View  Result Date: 05/01/2019 CLINICAL DATA:  Dyspnea EXAM: PORTABLE CHEST 1 VIEW COMPARISON:  04/08/2014 chest radiograph. FINDINGS: Stable cardiomediastinal silhouette with normal heart size. No pneumothorax. Small bilateral pleural effusions. No pulmonary edema. Hazy right parahilar and bibasilar lung opacities. IMPRESSION: 1. Small bilateral pleural effusions. 2. Hazy right parahilar and bibasilar lung opacities, which could represent atelectasis and/or pneumonia. Electronically Signed   By: Ilona Sorrel M.D.   On: 05/01/2019 17:28   Dg Abd Portable 1v  Result Date: 05/05/2019 CLINICAL DATA:  Enteric tube placement EXAM: PORTABLE ABDOMEN - 1 VIEW COMPARISON:  None. FINDINGS: The tip of the enteric tube projects over the stomach. The tip is pointed distally. The bowel gas pattern is nonspecific. IMPRESSION: NG tube tip projects over the gastric body. The tip is pointed distally. Electronically Signed   By: Constance Holster M.D.   On: 05/05/2019 17:06   US Pelvic Complete With Transvaginal  Result Date:  05/02/2019 CLINICAL DATA:  [facility] year old with enlarged left ovary on CT. EXAM: TRANSABDOMINAL AND TRANSVAGINAL ULTRASOUND OF PELVIS TECHNIQUE: Both transabdominal and transvaginal ultrasound examinations of the pelvis were performed. Transabdominal technique was performed for global imaging of the pelvis including uterus, ovaries, adnexal regions, and pelvic cul-de-sac. It was necessary to proceed with endovaginal exam following the transabdominal exam to visualize the uterus, ovaries, and adnexa. COMPARISON:  CT yesterday. FINDINGS: Technically limited evaluation. Uterus Normal postmenopausal uterus is not visualized. What is tentatively identified in the expected location of the uterus is abnormal for a postmenopausal patient. Measurements: 3.4 x 2.4 x 3 cm = volume: 12.8 mL. Heterogeneous hypoechogenicity with internal blood flow in the endometrial canal but no definite myometrial lesions or fibroids. Endometrium Assuming above structures the uterus, thickness: 16 mm. Heterogeneously hypoechoic endometrium with internal flow. Right ovary Not visualized.  No adnexal mass. Left ovary Measurements: 1.6 x 1.2 x 1.9 cm = volume: 2 mL. No dominant cystic or solid mass. Other findings No abnormal free fluid. IMPRESSION: 1. Technically limited sonographic evaluation of the  pelvis. Structure in the expected location of the uterus is abnormal for postmenopausal patient with heterogeneous hypoechoic and thickened endometrium. Recommend gynecologic consultation and possible further evaluation with MRI. Endometrial malignancy is considered. 2. Prominent left ovary for postmenopausal patient but no dominant cystic or solid mass defined by ultrasound. Electronically Signed   By: Keith Rake M.D.   On: 05/02/2019 02:36    Ct Abdomen Pelvis Wo Contrast  Result Date: 05/01/2019 CLINICAL DATA:  Abdominal pain. Constipation x5 days with nausea and vomiting. EXAM: CT ABDOMEN AND PELVIS WITHOUT CONTRAST TECHNIQUE:  Multidetector CT imaging of the abdomen and pelvis was performed following the standard protocol without IV contrast. COMPARISON:  None. FINDINGS: Lower chest: There are moderate to large bilateral pleural effusions. Bibasilar atelectasis is noted. Calcifications are noted of the mitral valve and coronary arteries. Atherosclerotic changes are noted of the thoracic aorta. Hepatobiliary: The liver contour appears somewhat nodular. There is no discrete hepatic mass, however evaluation is significantly limited in the absence of IV contrast. There is cholelithiasis without definite CT evidence of acute cholecystitis. The common bile duct is not well appreciated but there does not appear to be overt biliary ductal dilatation. Pancreas: Unremarkable. No pancreatic ductal dilatation or surrounding inflammatory changes. Spleen: Normal in size without focal abnormality. Adrenals/Urinary Tract: No hydronephrosis. No radiopaque kidney stones. The bladder is decompressed which limits evaluations. The adrenal glands are unremarkable. Stomach/Bowel: There is scattered colonic diverticula without CT evidence of diverticulitis. There is a moderate amount of stool in the colon. The appendix is located in the right lower quadrant and is unremarkable. There is no bowel obstruction. There appears to be some tethering of the small bowel loops, which are mostly centrally located. There appears to be diffuse wall thickening of the gastric body and fundus. The esophagus is dilated and fluid-filled. There are few low-attenuation cystic structures in the upper abdomen (axial series 2, image 39) which are not well evaluated. These could represent mildly dilated loops of small bowel. Vascular/Lymphatic: Aortic atherosclerosis. No enlarged abdominal or pelvic lymph nodes. Reproductive: The left ovary appears enlarged for the patient's age measuring approximately 4.1 by 3 cm. The right ovary is unremarkable. There appears to be some diffuse  thickening of the endometrial canal, however this is suboptimally evaluated secondary to lack of IV contrast. Other: There is a large volume of abdominal ascites. There is some haziness of the mesentery. There are no definite peritoneal implants, however evaluation is significantly limited by lack of IV contrast. Musculoskeletal: No acute or significant osseous findings. IMPRESSION: 1. Moderate to large bilateral pleural effusions. 2. Moderate to large volume of abdominal ascites. There is some haziness of the omentum and mesentery without definite peritoneal implants. However, evaluation is limited by lack of IV contrast. Fluid evaluation of the ascites is recommended to help exclude a malignant cause. 3. Mildly nodular appearance to the liver which can be seen in patients with cirrhosis. 4. Cholelithiasis without CT evidence of acute cholecystitis. 5. Wall thickening of the gastric body and fundus of unknown clinical significance. In addition, there is fluid dilation of the distal esophagus. Further evaluation with EGD should be considered. 6. Enlarged left ovary measuring 3 x 4.1 cm. Further evaluation with ultrasound is recommended. In addition, there appears to be diffuse thickening of the endometrial stripe. This should be further evaluated with ultrasound. 7. Moderate amount of stool throughout the colon. No evidence of a small-bowel obstruction. No CT evidence of diverticulitis. Aortic Atherosclerosis (ICD10-I70.0). Electronically Signed   By:  Constance Holster M.D.   On: 05/01/2019 13:42   Dg Chest 2 View  Result Date: 05/06/2019 CLINICAL DATA:  Difficulty breathing EXAM: CHEST - 2 VIEW COMPARISON:  05/01/2019, 10/09/2017 FINDINGS: Small moderate bilateral left greater than right pleural effusions. Asymmetric left greater than right interstitial and ground-glass opacity. Normal heart size. No pneumothorax. IMPRESSION: Small moderate left greater than right pleural effusions. Mild bibasilar airspace  disease with development of left greater than right ground-glass airspace opacity, suspicious for pneumonia. Could consider atypical or viral pneumonia in the appropriate clinical setting. Electronically Signed   By: Donavan Foil M.D.   On: 05/06/2019 16:18   US Abdomen Complete  Result Date: 05/02/2019 CLINICAL DATA:  Liver lesions.  Ascites. EXAM: ABDOMEN ULTRASOUND COMPLETE COMPARISON:  Abdominal CT yesterday. FINDINGS: Gallbladder: Physiologically distended. Multiple gallstones largest measuring 11 mm. No gallbladder wall thickening. No sonographic Murphy sign noted by sonographer. Common bile duct: Diameter: 5 mm, normal. Liver: Diffusely heterogeneous parenchyma without well-defined focal lesion. Suggestion of slight capsular nodularity. Portal vein is patent on color Doppler imaging with normal direction of blood flow towards the liver. IVC: No abnormality visualized. Pancreas: Visualized portion unremarkable. Spleen: Slightly small in size.  No focal abnormality. Right Kidney: Length: 9.8 cm. Echogenicity within normal limits. No mass or hydronephrosis visualized. Left Kidney: Length: 8.6 cm. Echogenicity within normal limits. No mass or hydronephrosis visualized. Abdominal aorta: No aneurysm visualized. Other findings: Ascites and bilateral pleural effusions. IMPRESSION: 1. Diffusely heterogeneous liver parenchyma without well-defined mass. Suggestion of slight capsular nodularity. Recommend correlation for cirrhosis risk factors. 2. Gallstones without sonographic findings of acute cholecystitis. 3. Ascites and pleural effusions. Electronically Signed   By: Keith Rake M.D.   On: 05/02/2019 03:19   Dg Chest Bilateral Decubitus  Result Date: 05/06/2019 CLINICAL DATA:  Difficulty breathing, vomiting EXAM: CHEST - BILATERAL DECUBITUS VIEW COMPARISON:  05/06/2019, 05/01/2019 FINDINGS: Moderate layering bilateral pleural effusions. Underlying ground-glass opacity within the left greater than right  lungs. IMPRESSION: Moderate layering bilateral pleural effusions. Electronically Signed   By: Donavan Foil M.D.   On: 05/06/2019 16:15   Dg Chest Portable 1 View  Result Date: 05/01/2019 CLINICAL DATA:  Dyspnea EXAM: PORTABLE CHEST 1 VIEW COMPARISON:  04/08/2014 chest radiograph. FINDINGS: Stable cardiomediastinal silhouette with normal heart size. No pneumothorax. Small bilateral pleural effusions. No pulmonary edema. Hazy right parahilar and bibasilar lung opacities. IMPRESSION: 1. Small bilateral pleural effusions. 2. Hazy right parahilar and bibasilar lung opacities, which could represent atelectasis and/or pneumonia. Electronically Signed   By: Ilona Sorrel M.D.   On: 05/01/2019 17:28   Dg Abd Portable 1v  Result Date: 05/05/2019 CLINICAL DATA:  Enteric tube placement EXAM: PORTABLE ABDOMEN - 1 VIEW COMPARISON:  None. FINDINGS: The tip of the enteric tube projects over the stomach. The tip is pointed distally. The bowel gas pattern is nonspecific. IMPRESSION: NG tube tip projects over the gastric body. The tip is pointed distally. Electronically Signed   By: Constance Holster M.D.   On: 05/05/2019 17:06   US Pelvic Complete With Transvaginal  Result Date: 05/02/2019 CLINICAL DATA:  [facility] year old with enlarged left ovary on CT. EXAM: TRANSABDOMINAL AND TRANSVAGINAL ULTRASOUND OF PELVIS TECHNIQUE: Both transabdominal and transvaginal ultrasound examinations of the pelvis were performed. Transabdominal technique was performed for global imaging of the pelvis including uterus, ovaries, adnexal regions, and pelvic cul-de-sac. It was necessary to proceed with endovaginal exam following the transabdominal exam to visualize the uterus, ovaries, and adnexa. COMPARISON:  CT yesterday.  FINDINGS: Technically limited evaluation. Uterus Normal postmenopausal uterus is not visualized. What is tentatively identified in the expected location of the uterus is abnormal for a postmenopausal patient. Measurements: 3.4  x 2.4 x 3 cm = volume: 12.8 mL. Heterogeneous hypoechogenicity with internal blood flow in the endometrial canal but no definite myometrial lesions or fibroids. Endometrium Assuming above structures the uterus, thickness: 16 mm. Heterogeneously hypoechoic endometrium with internal flow. Right ovary Not visualized.  No adnexal mass. Left ovary Measurements: 1.6 x 1.2 x 1.9 cm = volume: 2 mL. No dominant cystic or solid mass. Other findings No abnormal free fluid. IMPRESSION: 1. Technically limited sonographic evaluation of the pelvis. Structure in the expected location of the uterus is abnormal for postmenopausal patient with heterogeneous hypoechoic and thickened endometrium. Recommend gynecologic consultation and possible further evaluation with MRI. Endometrial malignancy is considered. 2. Prominent left ovary for postmenopausal patient but no dominant cystic or solid mass defined by ultrasound. Electronically Signed   By: Keith Rake M.D.   On: 05/02/2019 02:36   Pathology:  Diagnosis PERITONEAL FLUID (SPECIMEN 1 OF 1 COLLECTED 05/01/2019) METASTATIC ADENOCARCINOMA, CONSISTENT WITH GYNECOLOGIC PRIMARY. SEE COMMENT Jaquita Folds MD Pathologist, Electronic Signature (Case signed 05/06/2019)  Immunohistochemical stains show that the metastatic carcinoma is positive for CK7 and PAX8. The tumor cells are negative for CK20, CDX2, GATA-3, ER, WT 1 and D2-40. This immunophenotype is consistent with the above Interpretation.  Assessment and Plan:   Ovarian cancer vs. 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  There is no need for transfusion today.  Constipation -Due to her malignancy -Improving with laxatives -Continue laxatives  Dysphagia -Status post EGD and sigmoidoscopy -GI is following her closely  Shortness of breath and cough; suspected aspiration pneumonia -Antibiotics per primary team -V/Q scan performed this morning due to concern for possible PE; results pending  Stage IV CKD -Due to hypertension and diabetes -Management per primary team  Goals of Care -The patient is being seen by palliative care and has expressed that she would like to focus on comfort -Family meeting scheduled for later today -The patient is a DNR   Thank you for this referral.   Mikey Bussing, DNP, AGPCNP-BC, AOCNP  I have seen the patient and reviewed the plan of care with her and her son over the phone I agree to transition her care to palliative/comfort measures I will sign off  Heath Lark, MD

## 2019-05-07 NOTE — Progress Notes (Signed)
I have seen the patient, reviewed the documentation and plan of care as outlined by my NP  I have discussed with the patient and family per request She is very clear about her decision not to pursue chemotherapy.  Her son, "[name]" is contacted and I have addressed all his questions Prognosis is estimated less than 6 months, probably less with her co-morbidities such as renal failure, DM and dysphagia I will sign off. No follow-up needed Appreciate palliative care consult.

## 2019-05-08 DIAGNOSIS — J9601 Acute respiratory failure with hypoxia: Secondary | ICD-10-CM

## 2019-05-08 DIAGNOSIS — J189 Pneumonia, unspecified organism: Secondary | ICD-10-CM

## 2019-05-08 DIAGNOSIS — J918 Pleural effusion in other conditions classified elsewhere: Secondary | ICD-10-CM

## 2019-05-08 MED ORDER — FUROSEMIDE 20 MG PO TABS: 40.0000 mg | ORAL_TABLET | ORAL | 0 refills | Status: AC | PRN

## 2019-05-08 MED ORDER — SUCRALFATE 1 G PO TABS: 1.0000 g | ORAL_TABLET | Freq: Three times a day (TID) | ORAL | 0 refills | Status: AC

## 2019-05-08 MED ORDER — OXYCODONE HCL 5 MG/5ML PO SOLN: 5.0000 mg | ORAL | 0 refills | Status: AC | PRN

## 2019-05-08 MED ORDER — ONDANSETRON HCL 4 MG PO TABS: 4.0000 mg | ORAL_TABLET | Freq: Every day | ORAL | 0 refills | Status: AC | PRN

## 2019-05-08 MED ORDER — AMOXICILLIN-POT CLAVULANATE 500-125 MG PO TABS: 1.0000 | ORAL_TABLET | Freq: Two times a day (BID) | ORAL | 0 refills | Status: AC

## 2019-05-08 MED ORDER — POLYETHYLENE GLYCOL 3350 17 G PO PACK: 17.0000 g | PACK | Freq: Two times a day (BID) | ORAL | 0 refills | Status: AC

## 2019-05-08 MED ORDER — PANTOPRAZOLE SODIUM 40 MG PO TBEC: 40.0000 mg | DELAYED_RELEASE_TABLET | Freq: Two times a day (BID) | ORAL | 0 refills | Status: AC

## 2019-05-08 MED ORDER — ALUM & MAG HYDROXIDE-SIMETH 200-200-20 MG/5ML PO SUSP: 30.0000 mL | ORAL | 0 refills | Status: AC | PRN

## 2019-05-08 MED ORDER — LORAZEPAM 1 MG PO TABS: 1.0000 mg | ORAL_TABLET | ORAL | 0 refills | Status: AC | PRN

## 2019-05-08 MED FILL — MINTOX MAXIMUM STRENGTH SUS: 400-400-40 | 2 days supply | Qty: 355 | Fill #0

## 2019-05-08 MED FILL — LORazepam 1 MG TABS: 1 | 1 days supply | Qty: 6 | Fill #0

## 2019-05-08 MED FILL — AMOX-CLAV 500-125 MG TABLET: 500-125 | 6 days supply | Qty: 11 | Fill #0

## 2019-05-08 MED FILL — SUCRALFATE 1 GM TABLET: 1 | 30 days supply | Qty: 120 | Fill #0

## 2019-05-08 MED FILL — FUROSEMIDE 20 MG TAB: 20 | 15 days supply | Qty: 30 | Fill #0

## 2019-05-08 MED FILL — ONDANSETRON HCL 4 MG TABLET: 4 | 5 days supply | Qty: 5 | Fill #0

## 2019-05-08 MED FILL — POLYETHYLENE GLYCOL 3350 PO: 17 | 7 days supply | Qty: 238 | Fill #0

## 2019-05-08 MED FILL — oxyCODONE HCL 5 MG/5ML SOLN: 5 | 1 days supply | Qty: 30 | Fill #0

## 2019-05-08 NOTE — Progress Notes (Signed)
Daily Progress Note   Patient Name: Mary Harding       Date: 05/08/2019 DOB: 11/24/1934  Age: 85 y.o. MRN#: [id number] Attending Physician: Martyn Malay, MD Primary Care Physician: Deland Pretty, MD Admit Date: 05/01/2019  Reason for Consultation/Follow-up: Non pain symptom management, Pain control and Psychosocial/spiritual support  Subjective: Patient, awake, alert, and oriented x3. She is sitting upright in bed with oxygen on via nasal cannula. She is having some accessory muscle usage, congested cough with thick yellow tinged mucous, and is short of breath during conversation. Discussed symptom management and use of morphine/ativan for shortness of breath/anxiety. She verbalized understanding and is requesting morphine. Bedside RN aware.   Patient shares previous conversation with my colleague. She expresses she is at peace and comfortable with her decisions and she is ready to go home and be kept comfortable amongst her family and friends. She states she has a son who is flying in from [place], one from [place], [place], and [place]. She states she is at peace with knowing she has terminal cancer and that she has a good relationship with GOD, has lived a great life, raised great children, and is ready to go to heaven and see her parents, siblings, and husband. Support given. Patient states she is miserable and feels as though she is suffocating at times, and is unable to eat because she immediately feels full and somewhat nauseous. Again discussed symptom management.   She shares "I hope once I get home I will pass away in a few days and is more comfortable than I am during the process! Support given.   I spoke with her daughter Mary Harding, via phone at the bedside. Patient expressing her readiness to  get home in her own environment. Mary Harding reports she is waiting to hear about equipment delivery and requesting ems transportation. She states she would not feel comfortable transporting patient given her respiratory distress and generalized weakness. She reports she and her sister would not be able to assist or lift her if needed to get in and out of the car into the house. Discussed with her we/hospice could arrange for transport home via non-emergent ems. Daughter and patient verbalized appreciation.   Notified CSW/CM of transport request.   Length of Stay: 7  Current Medications: Scheduled Meds:  . amoxicillin-clavulanate  1  tablet Oral BID  . buPROPion  150 mg Oral Daily  . lidocaine  1 patch Transdermal Q24H  . metoCLOPramide (REGLAN) injection  5 mg Intravenous Q6H  . pantoprazole  40 mg Oral BID  . polyethylene glycol  17 g Oral BID  . senna-docusate  1 tablet Oral BID  . sucralfate  1 g Oral TID WC & HS    Continuous Infusions:   PRN Meds: alum & mag hydroxide-simeth, EPINEPHrine, [COMPLETED] Glycerin (Adult) **FOLLOWED BY** Glycerin (Adult), LORazepam, morphine CONCENTRATE, nitroGLYCERIN  Physical Exam         General: frail, chronically-ill appearing, short of breath Cardiovascular:RRR Pulmonary: short of breath, 2L/[place], audible congestion/cough  Neuro: A&O x3, generalized weakness   Vital Signs: BP (!) 121/58 (BP Location: Right Arm)   Pulse 96   Temp 97.7 F (36.5 C) (Oral)   Resp 16   Ht 5\' 5"  (1.651 m)   Wt 82.4 kg   SpO2 100%   BMI 30.23 kg/m  SpO2: SpO2: 100 % O2 Device: O2 Device: Nasal Cannula O2 Flow Rate: O2 Flow Rate (L/min): 2 L/min  Intake/output summary:   Intake/Output Summary (Last 24 hours) at 05/08/2019 1130 Last data filed at 05/07/2019 2322 Gross per 24 hour  Intake -  Output 300 ml  Net -300 ml   LBM: Last BM Date: 05/06/19 Baseline Weight: Weight: 73.7 kg Most recent weight: Weight: 82.4 kg       Palliative Assessment/Data: PPS 30%    Flowsheet Rows     Most Recent Value  Intake Tab  Unit at Time of Referral  Med/Surg Unit  Palliative Care Primary Diagnosis  Cancer  Date Notified  05/06/19  Palliative Care Type  New Palliative care  Reason for referral  Clarify Goals of Care  Date of Admission  05/01/19  # of days IP prior to Palliative referral  5  Clinical Assessment  Psychosocial & Spiritual Assessment  Palliative Care Outcomes      Patient Active Problem List   Diagnosis Date Noted  . Advanced care planning/counseling discussion   . Goals of care, counseling/discussion   . Palliative care by specialist   . Shortness of breath   . Dysphagia   . Change in bowel habits   . Malignant neoplasm of ovary ([place])   . Narrowing of stools   . Ascites 05/02/2019  . Pelvic mass 05/02/2019  . Severe malnutrition ([place]) 05/02/2019  . Acute renal failure superimposed on chronic kidney disease ([place])   . Large ovary   . Liver lesion 05/01/2019  . Pseudoaneurysm following procedure ([place]) 10/25/2018  . PAD (peripheral artery disease) ([place]) 10/01/2018  . Claudication in peripheral vascular disease ([place]) 09/30/2018  . Uncontrolled type 2 diabetes mellitus with hyperglycemia ([place]) 04/08/2018  . Hyperlipidemia 01/18/2017  . Urinary tract infection 01/18/2017  . Type II or unspecified type diabetes mellitus with renal manifestations, not stated as uncontrolled(250.40) 04/10/2014  . Type II or unspecified type diabetes mellitus without mention of complication, uncontrolled 04/10/2014  . Coronary atherosclerosis of native coronary artery 04/10/2014  . Unstable angina pectoris ([place]) 04/08/2014    Palliative Care Assessment & Plan   Patient Profile: 85 y.o. female  with past medical history of CAD, HTN, DM2, CKD IV, anxiety/depression, OSA, admitted on 05/01/2019 with constipation, nausea and vomiting. Workup has revealed metastatic adenocarcinoma (likely ovarian vs primary peritoneal) cancer- mets to liver, and with  malignant ascites. She is s/p paracentesis with 3L removed. She had EGD/and Colonoscopy for complaints of globus  and chest pain which revealed reflux esophigitis. Additionally, she has developed aspiration pneumonia as complication from EGD. Palliative medicine consulted for [place].   Recommendations/Plan:  DNR/DNI  Home with hospice for EOL support.   Family awaiting home equipment delivery and requesting transport home.   Roxanol PRN for air hunger/pain  Ativan PRN for anxiety  PMT will continue to follow and support.   Goals of Care and Additional Recommendations:  Limitations on Scope of Treatment: Full Comfort Care  Code Status:    Code Status Orders  (From admission, onward)         Start     Ordered   05/07/19 1131  Do not attempt resuscitation (DNR)  Continuous    Question Answer Comment  In the event of cardiac or respiratory ARREST Do not call a "code blue"   In the event of cardiac or respiratory ARREST Do not perform Intubation, CPR, defibrillation or ACLS   In the event of cardiac or respiratory ARREST Use medication by any route, position, wound care, and other measures to relive pain and suffering. May use oxygen, suction and manual treatment of airway obstruction as needed for comfort.      05/07/19 1130        Code Status History    Date Active Date Inactive Code Status Order ID Comments User Context   05/01/2019 2228 05/07/2019 1130 Full Code [id number]  Danna Hefty, DO ED   10/01/2018 1004 10/02/2018 1447 Full Code [id number]  Adrian Prows, MD Inpatient   04/09/2014 0857 04/10/2014 1435 Full Code [id number]  Adrian Prows, MD Inpatient   04/08/2014 1758 04/09/2014 0857 Full Code [id number]  Adrian Prows, MD ED      Prognosis:   POOR  Discharge Planning:  Home with Hospice  Care plan was discussed with bedside RN and Percell Locus, CM/CSW.   Total Time: 45 min.  Greater than 50%  of this time was spent counseling and coordinating care related to the above  assessment and plan.  Alda Lea, AGPCNP-BC Palliative Medicine Team  Phone: [phone] Pager: [phone] Amion: Bjorn Pippin    Please contact Palliative Medicine Team phone at [phone] for questions and concerns.

## 2019-05-08 NOTE — Progress Notes (Addendum)
Manufacturing engineer [facility]) Hospice  Received referral from [facility], for hospice services at home once pt discharges.  Chart under review by [facility] MD and eligibility is pending at this time.  Spoke with pt via phone.  She confirms she would like hospice support at home.  She is worried about being able to breathe and hurting.  She also wants to go home as soon as possible.  DME discussed, she will ned O2, bed, BSC.  [facility] will order through [facility].    [facility] will have a nurse at her home at [place].  Please send any medications needed for comfort home with the patient, so there is no gap in her level of comfort prior to [facility] RN getting to her home and starting services.  Please send completed DNR home with pt.  Thank you for this referral, Venia Carbon RN, BSN, [facility] Liaison (in [place]) [phone]  **Update, pt is eligible for hospice services at home.

## 2019-05-08 NOTE — Progress Notes (Signed)
Nutrition Brief Note  Chart reviewed. Pt now transitioning to comfort care.  No further nutrition interventions warranted at this time.  Please re-consult as needed.   Sebastian Dzik A. Leverne Amrhein, RD, LDN, CDCES Registered Dietitian II Certified Diabetes Care and Education Specialist Pager: [phone] After hours Pager: [phone]  

## 2019-05-08 NOTE — TOC Progression Note (Signed)
Transition of Care [facility]) - Progression Note    Patient Details  Name: Mary Harding MRN: [id number] Date of Birth: [birth year]  Transition of Care [facility]) CM/SW Contact  Sharin Mons, RN Phone Number: 05/08/2019, 9:08 AM  Clinical Narrative:    NCM received consult:Patient to go home with Hospice- will need equipment- she also has policy that can provide for private duty care.  NCM spoke with pt and pt confirmed plan is to d/c to home with hospice care. Choice offered to pt. Pt selected [facility]. Referral made with Jennifer/ [facility] liaison @ [phone]. NCM paged MD to discuss d/c readiness, awaiting call back. NCM will continue to monitor for needs ...   Expected Discharge Plan: Home/Self Care Barriers to Discharge: Continued Medical Work up  Expected Discharge Plan and Services Expected Discharge Plan: Home/Self Care   Discharge Planning Services: CM Consult   Living arrangements for the past 2 months: Single Family Home                 DME Arranged: (owns walker and cane)                     Social Determinants of Health (SDOH) Interventions    Readmission Risk Interventions No flowsheet data found.

## 2019-05-08 NOTE — Progress Notes (Addendum)
NCM received call from [facility]'s intake nurse, Eddie Dibbles. Eddie Dibbles [phone]) would  like for nurse to call him once PTAR has arrived on floor for pt pickup. He then will meet pt @ her residence to do intake assessment. NCM made nurse aware. Whitman Hero RN,BSN,CM

## 2019-05-08 NOTE — Progress Notes (Signed)
PTAR called and transport set up eta by 8pm.

## 2019-05-08 NOTE — TOC Transition Note (Addendum)
Transition of Care [facility]) - CM/SW Discharge Note   Patient Details  Name: Mary Harding MRN: [id number] Date of Birth: 02/13/1934  Transition of Care [facility]) CM/SW Contact:  Sharin Mons, RN Phone Number: 05/08/2019, 12:16 PM   Clinical Narrative:    Plan: Pt will transition to home today with hospice care. Hospice intake scheduled for 6pm. Pt will need PTAR services arranged for transportation to home once DME ( ex. hospital bed, oxygen) have been delivered to pt's home. Daughter [name] to received mom @ home once d/c.  Signed DNR gold form to go with pt.   Mikey Bussing (Daughter) Ravleen Ries (Daughter)    [phone]      Final next level of care: Home w Hospice Care Barriers to Discharge: Other (comment)(discharging to home with hospice care)   Patient Goals and CMS Choice Patient states their goals for this hospitalization and ongoing recovery are:: to get better and go home CMS Medicare.gov Compare Post Acute Care list provided to:: Patient    Discharge Placement                 Discharge Plan and Services   Discharge Planning Services: CM Consult            DME Arranged: (owns walker and cane)                    Social Determinants of Health (SDOH) Interventions     Readmission Risk Interventions No flowsheet data found.

## 2019-05-08 NOTE — Progress Notes (Signed)
SATURATION QUALIFICATIONS: (This note is used to comply with regulatory documentation for home oxygen)  Patient Saturations on Room Air at Rest = 96%  Patient Saturations on Room Air while Ambulating = 79%  Patient Saturations on 2 Liters of oxygen while Ambulating = 96%  Please briefly explain why patient needs home oxygen: desats with exertion

## 2019-05-08 NOTE — Progress Notes (Signed)
Chaplain Note:  Advance Directive Completed   I received request to assist pt. To have her Advance Directive Notarized. This was completed and copies given to pt. And one copy provided to unit secretary for chart.   Pt is a very pleasant woman who talked about her decision not to pursue treatment, but to go home with hospice. She was quite affirming of her physician Dr. Andria Frames for how he helped her make the best decision for herself. She indicated that she just wants to have a peaceful death. She asked me to pray for that with her and I did. She seems to have good family support as well as friends to support her now.   Sue Lush  [phone]

## 2019-05-08 NOTE — Discharge Instructions (Signed)
You were hospitalized at [facility] for constipation and trouble swallowing.  Unfortunately, imaging found concern for possible malignancy.  You had extensive work-up while admitted and were found to likely have metastatic ovarian cancer.  Extensive discussion with family and doctors, you opted to be with your family and undergo full comfort care.  You were seen by palliative medicine which helped arrange home hospice care.  A home hospice nurse is scheduled to be at your home this evening.  It was such a pleasure to take care of you during your time here at [facility].  During your time here you were also treated for aspiration pneumonia.  We opted to continue this treatment to help maintain comfort for you.  You will need to complete your antibiotics as prescribed. Your next dose will be tonight.  We have also sent you home with oxygen to keep you as comfortable as possible.  If you desire you may follow-up with gastroenterology for further imaging and therapeutic treatment of your swallowing trouble.

## 2019-05-08 NOTE — Progress Notes (Signed)
Patient was given her meds and belongings.  No IV intact.  Foley Catheter clean and intact. Patient on 2LPM of oxygen nasal cannula. Vitals stable prior to discharge.  Discharge information given to the patient.  Patient left with PTAR.

## 2019-05-08 NOTE — Progress Notes (Signed)
Manufacturing engineer [facility]) [facility]  DME scheduled to be delivered today around 2-4.  Family then decided another location in the home would be more appropriate and rescheduled DME delivery to 4-7pm today to allow time to rearrange home.  [facility] will push back nurse visit to allow DME to be set up and transportation to be arranged.  Will update once family is ready for pt.  Venia Carbon RN, BSN, [facility] Liaison  [phone]

## 2019-06-18 DEATH — deceased

## 2019-06-25 ENCOUNTER — Other Ambulatory Visit: Payer: Medicare Other

## 2019-06-25 DIAGNOSIS — Z5329 Procedure and treatment not carried out because of patient's decision for other reasons: Secondary | ICD-10-CM

## 2019-06-30 ENCOUNTER — Ambulatory Visit: Payer: Medicare Other | Admitting: Cardiology

## 2019-08-11 ENCOUNTER — Other Ambulatory Visit: Payer: Self-pay | Admitting: Cardiology
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
